# Patient Record
Sex: Male | Born: 1960
Health system: Southern US, Community
[De-identification: ages and names within clinical notes are randomized; demographics above are authoritative.]

## PROBLEM LIST (undated history)

## (undated) DIAGNOSIS — F988 Other specified behavioral and emotional disorders with onset usually occurring in childhood and adolescence: Secondary | ICD-10-CM

## (undated) DIAGNOSIS — F32A Depression, unspecified: Secondary | ICD-10-CM

## (undated) DIAGNOSIS — M549 Dorsalgia, unspecified: Secondary | ICD-10-CM

## (undated) DIAGNOSIS — E7800 Pure hypercholesterolemia, unspecified: Secondary | ICD-10-CM

## (undated) DIAGNOSIS — R739 Hyperglycemia, unspecified: Secondary | ICD-10-CM

## (undated) DIAGNOSIS — R7989 Other specified abnormal findings of blood chemistry: Secondary | ICD-10-CM

## (undated) DIAGNOSIS — I8390 Asymptomatic varicose veins of unspecified lower extremity: Secondary | ICD-10-CM

## (undated) DIAGNOSIS — R609 Edema, unspecified: Secondary | ICD-10-CM

## (undated) DIAGNOSIS — K649 Unspecified hemorrhoids: Secondary | ICD-10-CM

## (undated) DIAGNOSIS — I10 Essential (primary) hypertension: Secondary | ICD-10-CM

## (undated) DIAGNOSIS — M1990 Unspecified osteoarthritis, unspecified site: Secondary | ICD-10-CM

## (undated) DIAGNOSIS — F329 Major depressive disorder, single episode, unspecified: Secondary | ICD-10-CM

## (undated) DIAGNOSIS — E785 Hyperlipidemia, unspecified: Secondary | ICD-10-CM

## (undated) DIAGNOSIS — E291 Testicular hypofunction: Secondary | ICD-10-CM

## (undated) DIAGNOSIS — R600 Localized edema: Secondary | ICD-10-CM

## (undated) DIAGNOSIS — R945 Abnormal results of liver function studies: Secondary | ICD-10-CM

## (undated) DIAGNOSIS — M75122 Complete rotator cuff tear or rupture of left shoulder, not specified as traumatic: Secondary | ICD-10-CM

## (undated) DIAGNOSIS — G4730 Sleep apnea, unspecified: Secondary | ICD-10-CM

## (undated) DIAGNOSIS — M216X1 Other acquired deformities of right foot: Secondary | ICD-10-CM

## (undated) HISTORY — DX: Depression, unspecified: F32.A

## (undated) HISTORY — DX: Other specified behavioral and emotional disorders with onset usually occurring in childhood and adolescence: F98.8

## (undated) HISTORY — DX: Hyperglycemia, unspecified: R73.9

## (undated) HISTORY — DX: Major depressive disorder, single episode, unspecified: F32.9

## (undated) HISTORY — DX: Hyperlipidemia, unspecified: E78.5

## (undated) HISTORY — DX: Sleep apnea, unspecified: G47.30

## (undated) HISTORY — PX: OTHER SURGICAL HISTORY: SHX169

## (undated) HISTORY — DX: Other specified abnormal findings of blood chemistry: R79.89

## (undated) HISTORY — DX: Essential (primary) hypertension: I10

## (undated) HISTORY — DX: Abnormal results of liver function studies: R94.5

## (undated) HISTORY — DX: Other acquired deformities of right foot: M21.6X1

## (undated) HISTORY — DX: Testicular hypofunction: E29.1

---

## 1988-05-22 HISTORY — PX: VASECTOMY: SHX75

## 1999-05-23 HISTORY — PX: HERNIA REPAIR: SHX51

## 2002-08-20 ENCOUNTER — Ambulatory Visit (HOSPITAL_BASED_OUTPATIENT_CLINIC_OR_DEPARTMENT_OTHER): Admission: RE | Admit: 2002-08-20 | Discharge: 2002-08-20 | Payer: Self-pay | Admitting: Family Medicine

## 2003-05-23 DIAGNOSIS — G4730 Sleep apnea, unspecified: Secondary | ICD-10-CM

## 2003-05-23 HISTORY — DX: Sleep apnea, unspecified: G47.30

## 2004-08-26 ENCOUNTER — Ambulatory Visit: Payer: Self-pay | Admitting: Family Medicine

## 2004-09-08 ENCOUNTER — Ambulatory Visit: Payer: Self-pay | Admitting: Family Medicine

## 2005-01-16 ENCOUNTER — Emergency Department (HOSPITAL_COMMUNITY): Admission: EM | Admit: 2005-01-16 | Discharge: 2005-01-16 | Payer: Self-pay | Admitting: Emergency Medicine

## 2005-01-18 ENCOUNTER — Ambulatory Visit: Payer: Self-pay | Admitting: Internal Medicine

## 2005-02-23 ENCOUNTER — Ambulatory Visit (HOSPITAL_COMMUNITY): Admission: RE | Admit: 2005-02-23 | Discharge: 2005-02-23 | Payer: Self-pay | Admitting: Surgery

## 2005-04-12 ENCOUNTER — Ambulatory Visit: Payer: Self-pay | Admitting: Internal Medicine

## 2005-10-31 ENCOUNTER — Ambulatory Visit: Payer: Self-pay | Admitting: Internal Medicine

## 2006-03-07 ENCOUNTER — Encounter: Payer: Self-pay | Admitting: Internal Medicine

## 2006-05-22 HISTORY — PX: KNEE ARTHROSCOPY: SUR90

## 2006-10-29 ENCOUNTER — Encounter (INDEPENDENT_AMBULATORY_CARE_PROVIDER_SITE_OTHER): Payer: Self-pay | Admitting: *Deleted

## 2006-10-31 ENCOUNTER — Telehealth (INDEPENDENT_AMBULATORY_CARE_PROVIDER_SITE_OTHER): Payer: Self-pay | Admitting: *Deleted

## 2006-10-31 DIAGNOSIS — G4730 Sleep apnea, unspecified: Secondary | ICD-10-CM | POA: Insufficient documentation

## 2006-10-31 DIAGNOSIS — J309 Allergic rhinitis, unspecified: Secondary | ICD-10-CM | POA: Insufficient documentation

## 2006-10-31 DIAGNOSIS — F329 Major depressive disorder, single episode, unspecified: Secondary | ICD-10-CM

## 2006-10-31 DIAGNOSIS — F988 Other specified behavioral and emotional disorders with onset usually occurring in childhood and adolescence: Secondary | ICD-10-CM

## 2006-10-31 DIAGNOSIS — I10 Essential (primary) hypertension: Secondary | ICD-10-CM

## 2006-12-10 ENCOUNTER — Encounter (INDEPENDENT_AMBULATORY_CARE_PROVIDER_SITE_OTHER): Payer: Self-pay | Admitting: *Deleted

## 2007-01-02 ENCOUNTER — Ambulatory Visit: Payer: Self-pay | Admitting: Internal Medicine

## 2007-01-02 DIAGNOSIS — E291 Testicular hypofunction: Secondary | ICD-10-CM | POA: Insufficient documentation

## 2007-01-02 DIAGNOSIS — R945 Abnormal results of liver function studies: Secondary | ICD-10-CM

## 2007-01-16 ENCOUNTER — Ambulatory Visit: Payer: Self-pay | Admitting: Internal Medicine

## 2007-02-14 ENCOUNTER — Ambulatory Visit: Payer: Self-pay | Admitting: Internal Medicine

## 2007-02-26 ENCOUNTER — Ambulatory Visit: Payer: Self-pay | Admitting: Internal Medicine

## 2007-05-07 ENCOUNTER — Encounter (INDEPENDENT_AMBULATORY_CARE_PROVIDER_SITE_OTHER): Payer: Self-pay | Admitting: *Deleted

## 2007-08-13 ENCOUNTER — Telehealth (INDEPENDENT_AMBULATORY_CARE_PROVIDER_SITE_OTHER): Payer: Self-pay | Admitting: *Deleted

## 2007-08-13 ENCOUNTER — Encounter (INDEPENDENT_AMBULATORY_CARE_PROVIDER_SITE_OTHER): Payer: Self-pay | Admitting: *Deleted

## 2007-08-19 ENCOUNTER — Telehealth: Payer: Self-pay | Admitting: Internal Medicine

## 2007-08-28 ENCOUNTER — Ambulatory Visit: Payer: Self-pay | Admitting: Internal Medicine

## 2007-08-28 DIAGNOSIS — E785 Hyperlipidemia, unspecified: Secondary | ICD-10-CM

## 2007-08-28 DIAGNOSIS — R7309 Other abnormal glucose: Secondary | ICD-10-CM

## 2007-09-03 ENCOUNTER — Ambulatory Visit: Payer: Self-pay | Admitting: Internal Medicine

## 2007-09-05 LAB — CONVERTED CEMR LAB
Cholesterol: 202 mg/dL (ref 0–200)
Direct LDL: 138.8 mg/dL
HDL: 47.9 mg/dL (ref >39.0)
Hgb A1c MFr Bld: 5.9 % (ref 4.6–6.0)
Total CHOL/HDL Ratio: 4.2
Triglycerides: 110 mg/dL (ref 0–149)
VLDL: 22 mg/dL (ref 0–40)

## 2007-12-02 ENCOUNTER — Ambulatory Visit: Payer: Self-pay | Admitting: Internal Medicine

## 2007-12-18 ENCOUNTER — Telehealth (INDEPENDENT_AMBULATORY_CARE_PROVIDER_SITE_OTHER): Payer: Self-pay | Admitting: *Deleted

## 2007-12-25 ENCOUNTER — Ambulatory Visit: Payer: Self-pay | Admitting: Internal Medicine

## 2008-01-20 ENCOUNTER — Ambulatory Visit: Payer: Self-pay | Admitting: Internal Medicine

## 2008-02-04 ENCOUNTER — Ambulatory Visit: Payer: Self-pay | Admitting: Internal Medicine

## 2008-02-18 ENCOUNTER — Ambulatory Visit: Payer: Self-pay | Admitting: Internal Medicine

## 2008-03-03 ENCOUNTER — Encounter (INDEPENDENT_AMBULATORY_CARE_PROVIDER_SITE_OTHER): Payer: Self-pay | Admitting: *Deleted

## 2008-03-19 ENCOUNTER — Ambulatory Visit: Payer: Self-pay | Admitting: Internal Medicine

## 2008-04-02 ENCOUNTER — Ambulatory Visit: Payer: Self-pay | Admitting: Internal Medicine

## 2008-04-09 ENCOUNTER — Telehealth (INDEPENDENT_AMBULATORY_CARE_PROVIDER_SITE_OTHER): Payer: Self-pay | Admitting: *Deleted

## 2008-04-13 ENCOUNTER — Telehealth (INDEPENDENT_AMBULATORY_CARE_PROVIDER_SITE_OTHER): Payer: Self-pay | Admitting: *Deleted

## 2008-04-30 ENCOUNTER — Ambulatory Visit (HOSPITAL_COMMUNITY): Admission: RE | Admit: 2008-04-30 | Discharge: 2008-04-30 | Payer: Self-pay | Admitting: Orthopedic Surgery

## 2008-05-07 ENCOUNTER — Ambulatory Visit: Payer: Self-pay | Admitting: Internal Medicine

## 2008-05-13 ENCOUNTER — Encounter (INDEPENDENT_AMBULATORY_CARE_PROVIDER_SITE_OTHER): Payer: Self-pay | Admitting: *Deleted

## 2008-05-13 LAB — CONVERTED CEMR LAB
BUN: 26 mg/dL — ABNORMAL HIGH (ref 6–23)
CO2: 29 meq/L (ref 19–32)
Calcium: 9.5 mg/dL (ref 8.4–10.5)
Chloride: 102 meq/L (ref 96–112)
Creatinine, Ser: 1.1 mg/dL (ref 0.4–1.5)
GFR calc Af Amer: 92 mL/min
GFR calc non Af Amer: 76 mL/min
Glucose, Bld: 84 mg/dL (ref 70–99)
Hgb A1c MFr Bld: 6 % (ref 4.6–6.0)
Potassium: 3.9 meq/L (ref 3.5–5.1)
Sodium: 139 meq/L (ref 135–145)

## 2008-05-19 ENCOUNTER — Telehealth (INDEPENDENT_AMBULATORY_CARE_PROVIDER_SITE_OTHER): Payer: Self-pay | Admitting: *Deleted

## 2008-05-21 ENCOUNTER — Ambulatory Visit: Payer: Self-pay | Admitting: Internal Medicine

## 2008-05-22 HISTORY — PX: KNEE ARTHROSCOPY: SUR90

## 2008-05-25 ENCOUNTER — Ambulatory Visit (HOSPITAL_COMMUNITY): Admission: RE | Admit: 2008-05-25 | Discharge: 2008-05-25 | Payer: Self-pay | Admitting: Orthopedic Surgery

## 2008-06-15 ENCOUNTER — Encounter: Admission: RE | Admit: 2008-06-15 | Discharge: 2008-07-22 | Payer: Self-pay | Admitting: Orthopedic Surgery

## 2008-07-06 ENCOUNTER — Ambulatory Visit: Payer: Self-pay | Admitting: Internal Medicine

## 2008-07-14 ENCOUNTER — Encounter (INDEPENDENT_AMBULATORY_CARE_PROVIDER_SITE_OTHER): Payer: Self-pay | Admitting: *Deleted

## 2008-07-14 LAB — CONVERTED CEMR LAB
ALT: 25 units/L (ref 0–53)
AST: 27 units/L (ref 0–37)
Albumin: 4.1 g/dL (ref 3.5–5.2)
Alkaline Phosphatase: 90 units/L (ref 39–117)
Basophils Absolute: 0 10*3/uL (ref 0.0–0.1)
Basophils Relative: 0.3 % (ref 0.0–3.0)
Bilirubin, Direct: 0.1 mg/dL (ref 0.0–0.3)
Cholesterol: 173 mg/dL (ref 0–200)
Eosinophils Absolute: 0.3 10*3/uL (ref 0.0–0.7)
Eosinophils Relative: 4.1 % (ref 0.0–5.0)
HCT: 44.9 % (ref 39.0–52.0)
HDL: 43 mg/dL (ref >39.0)
Hemoglobin: 15.1 g/dL (ref 13.0–17.0)
LDL Cholesterol: 111 mg/dL — ABNORMAL HIGH (ref 0–99)
Lymphocytes Relative: 25.9 % (ref 12.0–46.0)
MCHC: 33.7 g/dL (ref 30.0–36.0)
MCV: 82.9 fL (ref 78.0–100.0)
Monocytes Absolute: 0.7 10*3/uL (ref 0.1–1.0)
Monocytes Relative: 10.9 % (ref 3.0–12.0)
Neutro Abs: 4 10*3/uL (ref 1.4–7.7)
Neutrophils Relative %: 58.8 % (ref 43.0–77.0)
Platelets: 219 10*3/uL (ref 150–400)
RBC: 5.42 M/uL (ref 4.22–5.81)
RDW: 16.3 % — ABNORMAL HIGH (ref 11.5–14.6)
TSH: 1.93 microintl units/mL (ref 0.35–5.50)
Total Bilirubin: 0.8 mg/dL (ref 0.3–1.2)
Total CHOL/HDL Ratio: 4
Total Protein: 7.6 g/dL (ref 6.0–8.3)
Triglycerides: 97 mg/dL (ref 0–149)
VLDL: 19 mg/dL (ref 0–40)
WBC: 6.7 10*3/uL (ref 4.5–10.5)

## 2008-07-22 ENCOUNTER — Ambulatory Visit: Payer: Self-pay | Admitting: Internal Medicine

## 2008-07-27 ENCOUNTER — Telehealth (INDEPENDENT_AMBULATORY_CARE_PROVIDER_SITE_OTHER): Payer: Self-pay | Admitting: *Deleted

## 2008-07-27 LAB — CONVERTED CEMR LAB
PSA: 0.58 ng/mL (ref 0.10–4.00)
Testosterone: 274.23 ng/dL — ABNORMAL LOW (ref 350.00–890.00)

## 2008-08-05 ENCOUNTER — Telehealth (INDEPENDENT_AMBULATORY_CARE_PROVIDER_SITE_OTHER): Payer: Self-pay | Admitting: *Deleted

## 2008-11-06 ENCOUNTER — Ambulatory Visit: Payer: Self-pay | Admitting: Internal Medicine

## 2008-11-10 ENCOUNTER — Telehealth (INDEPENDENT_AMBULATORY_CARE_PROVIDER_SITE_OTHER): Payer: Self-pay | Admitting: *Deleted

## 2008-11-10 LAB — CONVERTED CEMR LAB
Hgb A1c MFr Bld: 6.1 % (ref 4.6–6.5)
Testosterone: 540.52 ng/dL (ref 350.00–890.00)

## 2009-03-09 ENCOUNTER — Ambulatory Visit: Payer: Self-pay | Admitting: Internal Medicine

## 2009-03-15 LAB — CONVERTED CEMR LAB
Hgb A1c MFr Bld: 5.6 % (ref 4.6–6.5)
Testosterone: 1169.31 ng/dL — ABNORMAL HIGH (ref 350.00–890.00)

## 2009-03-23 ENCOUNTER — Encounter (INDEPENDENT_AMBULATORY_CARE_PROVIDER_SITE_OTHER): Payer: Self-pay | Admitting: *Deleted

## 2009-05-04 ENCOUNTER — Telehealth (INDEPENDENT_AMBULATORY_CARE_PROVIDER_SITE_OTHER): Payer: Self-pay | Admitting: *Deleted

## 2009-07-08 ENCOUNTER — Telehealth (INDEPENDENT_AMBULATORY_CARE_PROVIDER_SITE_OTHER): Payer: Self-pay | Admitting: *Deleted

## 2009-07-13 ENCOUNTER — Ambulatory Visit: Payer: Self-pay | Admitting: Internal Medicine

## 2009-07-13 LAB — CONVERTED CEMR LAB
ALT: 24 units/L (ref 0–53)
AST: 27 units/L (ref 0–37)
Albumin: 4.2 g/dL (ref 3.5–5.2)
Alkaline Phosphatase: 72 units/L (ref 39–117)
BUN: 15 mg/dL (ref 6–23)
Basophils Absolute: 0 10*3/uL (ref 0.0–0.1)
Basophils Relative: 0.3 % (ref 0.0–3.0)
Bilirubin, Direct: 0 mg/dL (ref 0.0–0.3)
CO2: 32 meq/L (ref 19–32)
Calcium: 9.5 mg/dL (ref 8.4–10.5)
Chloride: 100 meq/L (ref 96–112)
Cholesterol: 174 mg/dL (ref 0–200)
Creatinine, Ser: 1.3 mg/dL (ref 0.4–1.5)
Eosinophils Absolute: 0.2 10*3/uL (ref 0.0–0.7)
Eosinophils Relative: 3.7 % (ref 0.0–5.0)
GFR calc non Af Amer: 61.99 mL/min (ref >60)
Glucose, Bld: 82 mg/dL (ref 70–99)
HCT: 51.4 % (ref 39.0–52.0)
HDL: 50.3 mg/dL (ref >39.00)
Hemoglobin: 16.8 g/dL (ref 13.0–17.0)
LDL Cholesterol: 95 mg/dL (ref 0–99)
Lymphocytes Relative: 26.6 % (ref 12.0–46.0)
Lymphs Abs: 1.5 10*3/uL (ref 0.7–4.0)
MCHC: 32.7 g/dL (ref 30.0–36.0)
MCV: 88.9 fL (ref 78.0–100.0)
Monocytes Absolute: 0.7 10*3/uL (ref 0.1–1.0)
Monocytes Relative: 11.7 % (ref 3.0–12.0)
Neutro Abs: 3.2 10*3/uL (ref 1.4–7.7)
Neutrophils Relative %: 57.7 % (ref 43.0–77.0)
PSA: 1.03 ng/mL (ref 0.10–4.00)
Platelets: 224 10*3/uL (ref 150.0–400.0)
Potassium: 4.3 meq/L (ref 3.5–5.1)
RBC: 5.78 M/uL (ref 4.22–5.81)
RDW: 15.8 % — ABNORMAL HIGH (ref 11.5–14.6)
Sodium: 140 meq/L (ref 135–145)
Testosterone: 540.22 ng/dL (ref 350.00–890.00)
Total Bilirubin: 0.3 mg/dL (ref 0.3–1.2)
Total CHOL/HDL Ratio: 3
Total Protein: 7.1 g/dL (ref 6.0–8.3)
Triglycerides: 146 mg/dL (ref 0.0–149.0)
VLDL: 29.2 mg/dL (ref 0.0–40.0)
WBC: 5.6 10*3/uL (ref 4.5–10.5)

## 2009-07-16 ENCOUNTER — Ambulatory Visit: Payer: Self-pay | Admitting: Internal Medicine

## 2009-07-19 ENCOUNTER — Encounter (INDEPENDENT_AMBULATORY_CARE_PROVIDER_SITE_OTHER): Payer: Self-pay | Admitting: *Deleted

## 2009-08-05 ENCOUNTER — Ambulatory Visit (HOSPITAL_COMMUNITY): Admission: RE | Admit: 2009-08-05 | Discharge: 2009-08-05 | Payer: Self-pay | Admitting: Orthopedic Surgery

## 2009-08-09 ENCOUNTER — Encounter (INDEPENDENT_AMBULATORY_CARE_PROVIDER_SITE_OTHER): Payer: Self-pay | Admitting: *Deleted

## 2009-08-10 ENCOUNTER — Ambulatory Visit: Payer: Self-pay | Admitting: Gastroenterology

## 2009-08-24 ENCOUNTER — Ambulatory Visit: Payer: Self-pay | Admitting: Gastroenterology

## 2009-08-24 LAB — HM COLONOSCOPY

## 2009-10-05 ENCOUNTER — Ambulatory Visit (HOSPITAL_BASED_OUTPATIENT_CLINIC_OR_DEPARTMENT_OTHER): Admission: RE | Admit: 2009-10-05 | Discharge: 2009-10-05 | Payer: Self-pay | Admitting: Orthopedic Surgery

## 2009-11-12 ENCOUNTER — Ambulatory Visit: Payer: Self-pay | Admitting: Internal Medicine

## 2009-11-17 ENCOUNTER — Telehealth (INDEPENDENT_AMBULATORY_CARE_PROVIDER_SITE_OTHER): Payer: Self-pay | Admitting: *Deleted

## 2009-11-19 LAB — CONVERTED CEMR LAB: Hgb A1c MFr Bld: 5.8 % (ref 4.6–6.5)

## 2009-11-23 ENCOUNTER — Telehealth: Payer: Self-pay | Admitting: Internal Medicine

## 2010-02-09 ENCOUNTER — Encounter: Payer: Self-pay | Admitting: Internal Medicine

## 2010-02-28 ENCOUNTER — Telehealth (INDEPENDENT_AMBULATORY_CARE_PROVIDER_SITE_OTHER): Payer: Self-pay | Admitting: *Deleted

## 2010-03-24 ENCOUNTER — Telehealth: Payer: Self-pay | Admitting: Internal Medicine

## 2010-03-28 ENCOUNTER — Encounter: Payer: Self-pay | Admitting: Internal Medicine

## 2010-04-05 ENCOUNTER — Encounter: Payer: Self-pay | Admitting: Internal Medicine

## 2010-05-02 ENCOUNTER — Encounter: Payer: Self-pay | Admitting: Internal Medicine

## 2010-06-19 LAB — CONVERTED CEMR LAB
ALT: 42 units/L (ref 0–53)
AST: 36 units/L (ref 0–37)
BUN: 19 mg/dL (ref 6–23)
Basophils Absolute: 0 10*3/uL (ref 0.0–0.1)
Basophils Relative: 0.3 % (ref 0.0–1.0)
CO2: 28 meq/L (ref 19–32)
Calcium: 9.6 mg/dL (ref 8.4–10.5)
Chloride: 102 meq/L (ref 96–112)
Cholesterol: 205 mg/dL (ref 0–200)
Creatinine, Ser: 1.1 mg/dL (ref 0.4–1.5)
Direct LDL: 143.3 mg/dL
Eosinophils Absolute: 0.2 10*3/uL (ref 0.0–0.6)
Eosinophils Relative: 4.2 % (ref 0.0–5.0)
GFR calc Af Amer: 92 mL/min
GFR calc non Af Amer: 76 mL/min
Glucose, Bld: 95 mg/dL (ref 70–99)
HCT: 43 % (ref 39.0–52.0)
HDL: 49.6 mg/dL (ref >39.0)
Hemoglobin: 15 g/dL (ref 13.0–17.0)
Hgb A1c MFr Bld: 6 % (ref 4.6–6.0)
Lymphocytes Relative: 30.8 % (ref 12.0–46.0)
MCHC: 34.8 g/dL (ref 30.0–36.0)
MCV: 80.8 fL (ref 78.0–100.0)
Monocytes Absolute: 0.6 10*3/uL (ref 0.2–0.7)
Monocytes Relative: 11.4 % — ABNORMAL HIGH (ref 3.0–11.0)
Neutro Abs: 3 10*3/uL (ref 1.4–7.7)
Neutrophils Relative %: 53.3 % (ref 43.0–77.0)
Platelets: 236 10*3/uL (ref 150–400)
Potassium: 4.3 meq/L (ref 3.5–5.1)
RBC: 5.32 M/uL (ref 4.22–5.81)
RDW: 14.7 % — ABNORMAL HIGH (ref 11.5–14.6)
Sodium: 139 meq/L (ref 135–145)
TSH: 2.18 microintl units/mL (ref 0.35–5.50)
Testosterone: 1216.83 ng/dL — ABNORMAL HIGH (ref 350.00–890)
Total CHOL/HDL Ratio: 4.1
Triglycerides: 151 mg/dL — ABNORMAL HIGH (ref 0–149)
VLDL: 30 mg/dL (ref 0–40)
WBC: 5.5 10*3/uL (ref 4.5–10.5)

## 2010-06-21 NOTE — Progress Notes (Signed)
Summary: refill  Phone Note Refill Request Message from:  Fax from Pharmacy  Refills Requested: Medication #1:  METOPROLOL TARTRATE 50 MG  TABS 1 by mouth bid [facility] - fax [phone]  Initial call taken by: Okey Regal [place],  February 28, 2010 1:41 PM    Prescriptions: METOPROLOL TARTRATE 50 MG  TABS (METOPROLOL TARTRATE) 1 by mouth bid  #180 x 0   Entered by:   Army Fossa CMA   Authorized by:   Nolon Rod. Paz MD   Signed by:   Army Fossa CMA on 02/28/2010   Method used:   Electronically to        [facility]* (retail)       [address]       [address] [place] [employer]       [place], [place]  [address]       Ph: [phone]       Fax: [phone]   RxID:   [id number]

## 2010-06-21 NOTE — Procedures (Signed)
Summary: Colonoscopy  Patient: [name] Note: All result statuses are Final unless otherwise noted.  Tests: (1) Colonoscopy (COL)   COL Colonoscopy           DONE     [facility]     [address]     [place], [place]  [address]           COLONOSCOPY PROCEDURE REPORT           PATIENT:  James Boone, James Boone  MR#:  [id number]     BIRTHDATE:  Nov 23, 1960, 48 yrs. old  GENDER:  male     ENDOSCOPIST:  Rachael Fee, MD     REF. BY:  [name], M.D.     PROCEDURE DATE:  08/24/2009     PROCEDURE:  Diagnostic Colonoscopy     ASA CLASS:  Class II     INDICATIONS:  FOBT positive stool     MEDICATIONS:   Fentanyl 75 mcg IV, Versed 9 mg IV     DESCRIPTION OF PROCEDURE:   After the risks benefits and     alternatives of the procedure were thoroughly explained, informed     consent was obtained.  Digital rectal exam was performed and     revealed no rectal masses.   The LB [id number] endoscope     was introduced through the anus and advanced to the cecum, which     was identified by both the appendix and ileocecal valve, without     limitations.  The quality of the prep was good, using MoviPrep.     The instrument was then slowly withdrawn as the colon was fully     examined.     <<PROCEDUREIMAGES>>           FINDINGS:  Moderate diverticulosis was found in the sigmoid to     descending colon segments (see image3).  Internal and external     hemorrhoids were found. These were small, not thrombosed.  This     was otherwise a normal examination of the colon (see image1,     image2, and image4).   Retroflexed views in the rectum revealed no     abnormalities.    The scope was then withdrawn from the patient     and the procedure completed.           COMPLICATIONS:  None     ENDOSCOPIC IMPRESSION:     1) Moderate diverticulosis in the sigmoid to descending colon     segments     2) Internal and external hemorrhoids     3) Otherwise normal examination; no polyps or cancers  RECOMMENDATIONS:     1) Continue current colorectal screening recommendations for     "routine risk" patients with a repeat colonoscopy in 10 years.           REPEAT EXAM:  10 years           ______________________________     Rachael Fee, MD           n.     eSIGNED:   Rachael Fee at 08/24/2009 09:27 AM           Vassie Loll, [id number]  Note: An exclamation mark (!) indicates a result that was not dispersed into the flowsheet. Document Creation Date: 08/24/2009 9:27 AM _______________________________________________________________________  (1) Order result status: Final Collection or observation date-time: 08/24/2009 09:23 Requested date-time:  Receipt date-time:  Reported date-time:  Referring Physician:  Ordering Physician: Rob Bunting [phone]) Specimen Source:  Source: Launa Grill Order Number: [id number] Lab site:   Appended Document: Colonoscopy    Clinical Lists Changes  Observations: Added new observation of COLONNXTDUE: 08/2019 (08/24/2009 13:02)      Appended Document: Colonoscopy patty, can you contact office of Dr. Dimas Millin [phone]) to have them send copies of any colonoscopy, path reports, office visit's on this patient  Appended Document: Colonoscopy Dr Christella Hartigan I called the above number and they do not have this pt.  She checked the birthdate and name.  Appended Document: Colonoscopy this was for the wrong patient.

## 2010-06-21 NOTE — Progress Notes (Signed)
----   Converted from flag ---- ---- 11/14/2009 12:35 PM, Jose E. Paz MD wrote: James Boone,  please contact Onalee Hua (do you know who i'm talking about?) or [facility] or other Respiratory Therapist group, he has OSA and needs a new mask ------------------------------       New/Updated Medications: * MASK FOR CPAP needs new mask DX: OSA- 780.57 Prescriptions: MASK FOR CPAP needs new mask DX: OSA- 780.57  #0 x 0   Entered by:   Doristine Devoid   Authorized by:   Nolon Rod. Paz MD   Signed by:   Doristine Devoid on 11/17/2009   Method used:   Reprint   RxID:   [id number]  information faxed to [facility] fax: [phone]

## 2010-06-21 NOTE — Letter (Signed)
Summary: [facility] Instructions  [facility]  [address] [place], [place] [address]   Phone: [phone]  Fax: [phone]       James Boone    09-10-1960    MRN: [id number]        Procedure Day Dorna Bloom:  James Boone  08/24/09     Arrival Time:  8:00AM     Procedure Time:  9:00AM     Location of Procedure:                    James Boone _  James Boone (4th Floor)                        PREPARATION FOR COLONOSCOPY WITH MOVIPREP   Starting 5 days prior to your procedure 08/19/09 do not eat nuts, seeds, popcorn, corn, beans, peas,  salads, or any raw vegetables.  Do not take any fiber supplements (e.g. Metamucil, Citrucel, and Benefiber).  THE DAY BEFORE YOUR PROCEDURE         DATE: 08/23/09  DAY: MONDAY  1.  Drink clear liquids the entire day-NO SOLID FOOD  2.  Do not drink anything colored red or purple.  Avoid juices with pulp.  No orange juice.  3.  Drink at least 64 oz. (8 glasses) of fluid/clear liquids during the day to prevent dehydration and help the prep work efficiently.  CLEAR LIQUIDS INCLUDE: Water Jello Ice Popsicles Tea (sugar ok, no milk/cream) Powdered fruit flavored drinks Coffee (sugar ok, no milk/cream) Gatorade Juice: apple, white grape, white cranberry  Lemonade Clear bullion, consomm, broth Carbonated beverages (any kind) Strained chicken noodle soup Hard Candy                             4.  In the morning, mix first dose of MoviPrep solution:    Empty 1 Pouch A and 1 Pouch B into the disposable container    Add lukewarm drinking water to the top line of the container. Mix to dissolve    Refrigerate (mixed solution should be used within 24 hrs)  5.  Begin drinking the prep at 5:00 p.m. The MoviPrep container is divided by 4 marks.   Every 15 minutes drink the solution down to the next mark (approximately 8 oz) until the full liter is complete.   6.  Follow completed prep with 16 oz of clear liquid of your choice (Nothing red  or purple).  Continue to drink clear liquids until bedtime.  7.  Before going to bed, mix second dose of MoviPrep solution:    Empty 1 Pouch A and 1 Pouch B into the disposable container    Add lukewarm drinking water to the top line of the container. Mix to dissolve    Refrigerate  THE DAY OF YOUR PROCEDURE      DATE: 07/24/09  DAY: TUESDAY  Beginning at 4:00AM (5 hours before procedure):         1. Every 15 minutes, drink the solution down to the next mark (approx 8 oz) until the full liter is complete.  2. Follow completed prep with 16 oz. of clear liquid of your choice.    3. You may drink clear liquids until 7:00AM (2 HOURS BEFORE PROCEDURE).   MEDICATION INSTRUCTIONS  Unless otherwise instructed, you should take regular prescription medications with a small sip of water   as early as possible the morning  of your procedure.         OTHER INSTRUCTIONS  You will need a responsible adult at least 52 years of age to accompany you and drive you home.   This person must remain in the waiting room during your procedure.  Wear loose fitting clothing that is easily removed.  Leave jewelry and other valuables at home.  However, you may wish to bring a book to read or  an iPod/MP3 player to listen to music as you wait for your procedure to start.  Remove all body piercing jewelry and leave at home.  Total time from sign-in until discharge is approximately 2-3 hours.  You should go home directly after your procedure and rest.  You can resume normal activities the  day after your procedure.  The day of your procedure you should not:   Drive   Make legal decisions   Operate machinery   Drink alcohol   Return to work  You will receive specific instructions about eating, activities and medications before you leave.    The above instructions have been reviewed and explained to me by   James Almas RN  August 10, 2009 8:14 AM     I fully understand and can  verbalize these instructions _____________________________ Date _________

## 2010-06-21 NOTE — Miscellaneous (Signed)
Summary: [facility] Previsit/prep  Clinical Lists Changes  Medications: Added new medication of MOVIPREP 100 GM  SOLR (PEG-KCL-NACL-NASULF-NA ASC-C) As per prep instructions. - Signed Rx of MOVIPREP 100 GM  SOLR (PEG-KCL-NACL-NASULF-NA ASC-C) As per prep instructions.;  #1 x 0;  Signed;  Entered by: Wyona Almas RN;  Authorized by: Rachael Fee MD;  Method used: Electronically to [facility]*, [address], [place], [place]  [address], Ph: [phone], Fax: [phone] Observations: Added new observation of NKA: T (08/10/2009 7:42)    Prescriptions: MOVIPREP 100 GM  SOLR (PEG-KCL-NACL-NASULF-NA ASC-C) As per prep instructions.  #1 x 0   Entered by:   Wyona Almas RN   Authorized by:   Rachael Fee MD   Signed by:   Wyona Almas RN on 08/10/2009   Method used:   Electronically to        [facility]* (retail)       [address]       [address]mailing       [place], [place]  [address]       Ph: [phone]       Fax: [phone]   RxID:   [id number]

## 2010-06-21 NOTE — Letter (Signed)
Summary: Previsit letter  [facility]  [address] [place], [place] [address]   Phone: [phone]  Fax: [phone]       07/19/2009 MRN: [id number]  James Boone [address] [place], [place]  [address]  Dear [name],  Welcome to the Gastroenterology Division at [facility].    You are scheduled to see a nurse for your pre-procedure visit on 08-10-09 at 8:00a.m. on the 3rd floor at [facility], [address]  We ask that you try to arrive at our office 15 minutes prior to your appointment time to allow for check-in.  Your nurse visit will consist of discussing your medical and surgical history, your immediate family medical history, and your medications.    Please bring a complete list of all your medications or, if you prefer, bring the medication bottles and we will list them.  We will need to be aware of both prescribed and over the counter drugs.  We will need to know exact dosage information as well.  If you are on blood thinners (Coumadin, Plavix, Aggrenox, Ticlid, etc.) please call our office today/prior to your appointment, as we need to consult with your physician about holding your medication.   Please be prepared to read and sign documents such as consent forms, a financial agreement, and acknowledgement forms.  If necessary, and with your consent, a friend or relative is welcome to sit-in on the nurse visit with you.  Please bring your insurance card so that we may make a copy of it.  If your insurance requires a referral to see a specialist, please bring your referral form from your primary care physician.  No co-pay is required for this nurse visit.     If you cannot keep your appointment, please call [phone] to cancel or reschedule prior to your appointment date.  This allows [place] the opportunity to schedule an appointment for another patient in need of care.    Thank you for choosing [facility] for your medical needs.  We  appreciate the opportunity to care for you.  Please visit [place] at our website  to learn more about our practice.                     Sincerely.                                                                                                                   The Gastroenterology Division

## 2010-06-21 NOTE — Letter (Signed)
Summary: CMN for Mask/[facility]  CMN for Mask/[facility]   Imported By: Lanelle Bal 02/17/2010 09:10:52  _____________________________________________________________________  External Attachment:    Type:   Image     Comment:   External Document

## 2010-06-21 NOTE — Assessment & Plan Note (Signed)
Summary: HAD LABS 2/22, TODAY IS CPX--GOT BUMPED///SPH   Vital Signs:  Patient profile:   50 year old male Height:      76 inches Weight:      295 pounds BMI:     36.04 Pulse rate:   76 / minute BP sitting:   132 / 80  (left arm)  Vitals Entered By: Doristine Devoid (July 16, 2009 11:32 AM) CC: cpx    History of Present Illness: CPX  Allergies: No Known Drug Allergies  Past History:  Past Medical History: Reviewed history from 03/09/2009 and no changes required. HYPERLIPIDEMIA DEPRESSION   HYPERTENSION  HYPOGONADISM, MALE  ADD  LIVER FUNCTION TESTS, ABNORMAL  SLEEP APNEA Dx aprox 2005, has a CPAP but doesn't use it often ALLERGIC RHINITIS (ICD-477.9)  Past Surgical History: Reviewed history from 07/06/2008 and no changes required. Vasectomy Umbilical Hernia repair L knee arthroscopt 05-2008 Dr Juliene Pina  Family History: Reviewed history from 03/09/2009 and no changes required. Family History Depression stroke--F CAD  father had a MI and a CABG at 47y/o colon ca: no prostate ca: no DM: no  Social History: Never Smoked Alcohol use-yes-seldom Married 2 kids unemployed  since 2008 diet-- not good  exercise-- none (has knee pain)  Review of Systems General:  Denies fatigue and fever. CV:  Denies chest pain or discomfort and swelling of feet; ambulatory BPs 120s . Resp:  Denies cough, shortness of breath, and wheezing; does not use CPAP consistently . GI:  Denies bloody stools, nausea, and vomiting. GU:  Denies hematuria, urinary frequency, and urinary hesitancy. Psych:  mood ok, sees psych .  Physical Exam  General:  alert, well-developed, and overweight-appearing.   Neck:  no masses and no thyromegaly.   Lungs:  normal respiratory effort, no intercostal retractions, no accessory muscle use, and normal breath sounds.   Heart:  normal rate, regular rhythm, and no murmur.   Abdomen:  soft, non-tender, no distention, no masses, no guarding, and no  rigidity.   Rectal:  No external abnormalities noted. Normal sphincter tone. No rectal masses or tenderness. stools brown, Hemoccult positive Prostate:  Prostate gland firm and smooth, no enlargement, nodularity, tenderness, mass, asymmetry or induration. Extremities:  no edema, walks with difficulty, has a brace in the right knee Psych:  Oriented X3, memory intact for recent and remote, normally interactive, good eye contact, not anxious appearing, and not depressed appearing.     Impression & Recommendations:  Problem # 1:  HEALTH SCREENING (ICD-V70.0) Td 2003 he has a positive family history of heart dz: again rec.  ASA, advised to use CPAP consistently to decrease CV risk  Hemoccult-positive, recommend colonoscopy.  Rationale behind this recommendation discussed all labs discussed, they are within normal unable to exercise information provided regards  a healthy diet  Orders: Gastroenterology Referral (GI)  Problem # 2:  HYPOGONADISM, MALE (ICD-257.2) last testosterone at  goal, no change  Complete Medication List: 1)  Metoprolol Tartrate 50 Mg Tabs (Metoprolol tartrate) .Marland Kitchen.. 1 by mouth bid 2)  Adderall Xr 25 Mg Cp24 (Amphetamine-dextroamphetamine) .... 2 by mouth qd 3)  Zoloft Tabs (Sertraline hcl tabs) .... 100mg  2 by mouth qd 4)  Lamictal Tabs (Lamotrigine tabs) .... 300mg  qd 5)  Vitamin C  6)  B Complex-b12 Tabs (B complex vitamins) 7)  Depo-testosterone 200 Mg/ml Oil (Testosterone cypionate) .... 1.5 cc every 2 weeks 8)  Asa 81mg   9)  Syringes For Im Testosterone Injections  .... As directed 10)  Wellbutrin Xl 150 Mg  Xr24h-tab (Bupropion hcl) .... Once daily  Patient Instructions: 1)  Please schedule a follow-up appointment in 4 months .

## 2010-06-21 NOTE — Progress Notes (Signed)
Summary: CPAP REQUEST  Phone Note From Other Clinic   Caller: [place] [phone] Summary of Call: Lillia Abed called to notify that she did receive patient sleep study, etc. She does need new prescription for replacement cpap and settings. Please fax to [phone].  *DME faxed per request. Initial call taken by: Lucious Groves CMA,  March 24, 2010 11:58 AM

## 2010-06-21 NOTE — Assessment & Plan Note (Signed)
Summary: 4 MONTH FOLLOWUP///SPH--Rm 12   Vital Signs:  Patient profile:   50 year old male Height:      76 inches Weight:      293 pounds BMI:     35.79 Temp:     97.9 degrees F oral Pulse rate:   72 / minute Pulse rhythm:   regular Resp:     16 per minute BP sitting:   126 / 90  (left arm) Cuff size:   large  Vitals Entered By: Mervin Kung CMA (November 12, 2009 10:10 AM) CC: Room 12  4 month follow up, non-fasting.  Pt states he needs new CPAP mask, his is broken. Comments Pt agrees all med doses and directions are correct.   History of Present Illness: ROV  DEPRESSION  -- per psych , symptoms well controlled  HYPERTENSION -- good  ambulatory BPs  HYPOGONADISM-- good medication compliance   SLEEP APNEA -- unable to use due to mask not working , needs a new mask     Allergies (verified): No Known Drug Allergies  Past History:  Past Medical History: Reviewed history from 03/09/2009 and no changes required. HYPERLIPIDEMIA DEPRESSION   HYPERTENSION  HYPOGONADISM, MALE  ADD  LIVER FUNCTION TESTS, ABNORMAL  SLEEP APNEA Dx aprox 2005, has a CPAP but doesn't use it often ALLERGIC RHINITIS (ICD-477.9)  Past Surgical History: Vasectomy Umbilical Hernia repair L knee arthroscopt 05-2008 Dr Juliene Pina R knee  arthroscopy surgery 5-11 Dr Juliene Pina  Social History: Reviewed history from 07/16/2009 and no changes required. Never Smoked Alcohol use-yes-seldom Married 2 kids unemployed  since 2008 diet-- not good  exercise-- none (has knee pain)  Review of Systems CV:  Denies chest pain or discomfort, shortness of breath with exertion, and swelling of feet. Endo:  Denies weight change; no hot flashes .  Physical Exam  General:  alert and well-developed.   Lungs:  normal respiratory effort, no intercostal retractions, no accessory muscle use, and normal breath sounds.   Heart:  normal rate, regular rhythm, and no murmur.   Extremities:  +/+++ pitting edema  bilaterally,at baseline per patient Psych:  not anxious appearing and not depressed appearing.     Impression & Recommendations:  Problem # 1:  HYPERGLYCEMIA, BORDERLINE (ICD-790.29) due for labs Orders: Venipuncture (92119) TLB-A1C / Hgb A1C (Glycohemoglobin) (83036-A1C)  Problem # 2:  HYPERTENSION (ICD-401.9) the BP slightly high today, good ambulatory BPs, no change His updated medication list for this problem includes:    Metoprolol Tartrate 50 Mg Tabs (Metoprolol tartrate) .Marland Kitchen... 1 by mouth bid  BP today: 126/90 Prior BP: 132/80 (07/16/2009)  Labs Reviewed: K+: 4.3 (07/13/2009) Creat: : 1.3 (07/13/2009)   Chol: 174 (07/13/2009)   HDL: 50.30 (07/13/2009)   LDL: 95 (07/13/2009)   TG: 146.0 (07/13/2009)  Problem # 3:  HYPOGONADISM, MALE (ICD-257.2) last testosterone level therapeutic No change  Problem # 4:  SLEEP APNEA (ICD-780.57) reports that his mask "falls apart a lot" Will refer to  RT for  mask fitting   Complete Medication List: 1)  Metoprolol Tartrate 50 Mg Tabs (Metoprolol tartrate) .Marland Kitchen.. 1 by mouth bid 2)  Adderall Xr 25 Mg Cp24 (Amphetamine-dextroamphetamine) .... 2 by mouth qd 3)  Zoloft Tabs (Sertraline hcl tabs) .... 100mg  2 by mouth qd 4)  Lamictal Tabs (Lamotrigine tabs) .... 300mg  qd 5)  Vitamin C  6)  B Complex-b12 Tabs (B complex vitamins) 7)  Depo-testosterone 200 Mg/ml Oil (Testosterone cypionate) .... 1.5 cc every 2 weeks 8)  Asa 81mg   9)  Syringes For Im Testosterone Injections  .... As directed 10)  Wellbutrin Xl 150 Mg Xr24h-tab (Bupropion hcl) .... Once daily  Patient Instructions: 1)  Please schedule a follow-up appointment in 4  to 5 months .   Current Allergies (reviewed today): No known allergies

## 2010-06-21 NOTE — Letter (Signed)
Summary: CMN for CPAP/[facility]  CMN for CPAP/[facility]   Imported By: Lanelle Bal 04/11/2010 10:58:37  _____________________________________________________________________  External Attachment:    Type:   Image     Comment:   External Document

## 2010-06-21 NOTE — Progress Notes (Signed)
Summary: NEED LAB ORDERS  FOR 2/22  Phone Note Call from Patient   Caller: Patient Summary of Call: PATIENT RETURNED CALL FROM [facility] ABOUT NEEDING TO RESCHEDULE HIS TUESDAY MORNING APPOINTMENT---HE WAS RESCHEDULED FOR FASTING LABS ON 2/22 AND THEN A CPX ON FRIDAY 2/25  PLEASE LET ME KNOW WHAT LAB ORDERS I SHOULD PUT IN FOR 2/22 AT 10:00  Initial call taken by: Jerolyn Shin,  July 08, 2009 4:03 PM  Follow-up for Phone Call        did you want to recheck his testosterone level? Shary Decamp  July 08, 2009 4:09 PM  testosterone level----Dx  257.2 PSA, BMP, LFTs, CBC, FLP----dx  v70.0 A1c  dx----790.29 Jose E. Paz MD  July 09, 2009 10:17 AM     Additional Follow-up for Phone Call Additional follow up Details #2::    scheduled labs Follow-up by: Barb Merino,  July 09, 2009 2:27 PM

## 2010-06-21 NOTE — Progress Notes (Signed)
Summary: REFILL REQUEST  Phone Note Refill Request Call back at [phone] Message from:  Pharmacy on November 23, 2009 11:44 AM  Refills Requested: Medication #1:  DEPO-TESTOSTERONE 200 MG/ML OIL 1.5 cc every 2 weeks   Dosage confirmed as above?Dosage Confirmed   Supply Requested: 1 month   Last Refilled: 07/12/2009 [facility]  Next Appointment Scheduled: OCTOBER 25TH 2011 Initial call taken by: Lavell Islam,  November 23, 2009 11:46 AM  Follow-up for Phone Call        does pt need his Testoreone level rechecked? Army Fossa CMA  November 23, 2009 12:08 PM  ok RF  x 6 months testosterone check on RTC [place] E. Akeria Hedstrom MD  November 23, 2009 1:02 PM     Prescriptions: DEPO-TESTOSTERONE 200 MG/ML OIL (TESTOSTERONE CYPIONATE) 1.5 cc every 2 weeks  #12 x 6   Entered by:   Army Fossa CMA   Authorized by:   Nolon Rod. Wynell Halberg MD   Signed by:   Army Fossa CMA on 11/23/2009   Method used:   Printed then faxed to ...       [facility]* (retail)       [address]       [address] Shipping/mailing       [place], [place]  [address]       Ph: [phone]       Fax: [phone]   RxID:   [id number]

## 2010-08-08 LAB — POCT I-STAT 4, (NA,K, GLUC, HGB,HCT)
Glucose, Bld: 88 mg/dL (ref 70–99)
HCT: 52 % (ref 39.0–52.0)
Hemoglobin: 17.7 g/dL — ABNORMAL HIGH (ref 13.0–17.0)
Potassium: 4.5 mEq/L (ref 3.5–5.1)
Sodium: 138 mEq/L (ref 135–145)

## 2010-09-12 ENCOUNTER — Telehealth: Payer: Self-pay | Admitting: *Deleted

## 2010-09-12 ENCOUNTER — Telehealth: Payer: Self-pay | Admitting: Internal Medicine

## 2010-09-12 NOTE — Telephone Encounter (Signed)
Please call pt, due for OV.

## 2010-09-12 NOTE — Telephone Encounter (Signed)
Please call pt and schedule an appt

## 2010-09-20 ENCOUNTER — Encounter: Payer: Self-pay | Admitting: Internal Medicine

## 2010-09-20 NOTE — Telephone Encounter (Signed)
Left messages for patient to call and make appointment, mailed letter today on 5/1 asking him to call and make appt

## 2010-10-04 NOTE — Op Note (Signed)
NAMEORDELL, PRICHETT NO.:  [id number]   MEDICAL RECORD NO.:  [id number]          PATIENT TYPE:  AMB   LOCATION:  DAY                          FACILITY:  [facility]   PHYSICIAN:  Ronald A. Gioffre, M.D.DATE OF BIRTH:  [birth year]   DATE OF PROCEDURE:  DATE OF DISCHARGE:                               OPERATIVE REPORT   PREOPERATIVE DIAGNOSES:  1. Degenerative arthritis left knee.  2. Torn medial meniscus, left knee.   POSTOPERATIVE DIAGNOSES:  1. Degenerative arthritis left knee.  2. Torn medial meniscus left knee.   OPERATIONS:  1. Diagnostic arthroscopy, left knee.  2. Medial meniscectomy posterior horn left knee.  3. Abrasion chondroplasty medial femoral condyle left knee.  4. Synovectomy suprapatellar pouch left knee.   DESCRIPTION OF PROCEDURE:  Under general anesthesia, routine orthopedic  prep and draping of the left lower extremity was carried out.  He was  given 2 grams of IV Ancef preop.  At this time, a small punctate  incision was made suprapatellar pouch and inflow cannula was inserted  and the knee was distended with saline.  Another small punctate incision  was made in the anterolateral joint.  The arthroscope was entered from  the lateral approach and a complete diagnostic arthroscopy was carried  out.  He had severe synovitis in the suprapatellar pouch.  I introduced  the ArthroCare and did a synovectomy of the suprapatellar pouch.  I went  down in the lateral joint.  The lateral joint looked fine.  The lateral  meniscus was intact.  The cruciates were intact.  The medial joint was  the main issue.  He had severe degenerative arthritic changes in the  medial joint.  The cartilaginous surface of the distal femur was  literally peeling off like an orange peel.  I introduced the ArthroCare  and the shaver suction device and did an abrasion chondroplasty.  Following that, I went down and approached the medial meniscus.  The  posterior horn was torn.   I introduced the ArthroCare and did a partial  medial meniscectomy of the posterior horn.  Note, he had rather  significant arthritis in the knee.  I thoroughly irrigated out the knee,  removed the fluid and closed all three punctate incision with 3-0 nylon  suture.  I injected 20 mL of half-percent Marcaine with epinephrine into  the knee joint.  A sterile Neosporin bundle dressing was applied.  Following that, the patient left the operative room in satisfactory  position.   POSTOP:  1. He will be on Percocet 10/650, 1 every 4 hours p.r.n. for pain.  2. Aspirin 325 mg b.i.d. a day and b.i.d. for 2 weeks as an      anticoagulant.  3. He will be on crutches partial to full-weightbearing as tolerated.  4. He will see me in the office in 12-14 days for suture removal.           ______________________________  Georges Lynch. Darrelyn Hillock, M.D.     RAG/MEDQ  D:  05/25/2008  T:  05/25/2008  Job:  [id number]

## 2010-10-07 NOTE — Op Note (Signed)
[name], James Boone NO.:  [id number]   MEDICAL RECORD NO.:  [id number]          PATIENT TYPE:  AMB   LOCATION:  DAY                          FACILITY:  [facility]   PHYSICIAN:  Thomas A. Cornett, M.D.DATE OF BIRTH:  [birth year]   DATE OF PROCEDURE:  02/23/2005  DATE OF DISCHARGE:                                 OPERATIVE REPORT   PREOPERATIVE DIAGNOSIS:  Umbilical hernia.   POSTOPERATIVE DIAGNOSIS:  Umbilical hernia.   PROCEDURE:  Umbilical hernia repair.   SURGEON:  Maisie Fus A. Cornett, M.D.   ANESTHESIA:  General endotracheal anesthesia with 20 mL of 0.25% Sensorcaine  local.   ESTIMATED BLOOD LOSS:  10 mL.   SPECIMENS:  None.   INDICATIONS FOR PROCEDURE:  The patient is a 50 year old male with  progressively enlarging umbilical hernia. It has been giving him pain and he  wished to have it repaired on this basis. After discussion of the procedure,  he agreed to proceed understanding the risks, benefits and long-term  outcomes.   DESCRIPTION OF PROCEDURE:  The patient was brought to the operating suite  and placed supine. After induction of general endotracheal esthesia, the  periumbilical region was prepped and draped in a sterile fashion. I  infiltrated the umbilicus with 0.25% Sensorcaine with epinephrine and local  anesthesia in the subcutaneous tissues. An incision was made just below the  umbilicus in a horizontal fashion. Dissection was carried down and I used a  hemostat to dissect around the hernia sac which was adherent to the  undersurface of the umbilical skin. I was able to peel this away, open the  sac and found a small piece of omental fat which I reduced back into the  abdominal cavity. It was viable with no signs of incarceration or  strangulation. Once I did this, I was able to dissect the umbilicus off the  fascia and define the fascial defect circumferentially. Once I did this, it  measured less than the size of my fifth digit and I felt  that primary  closure would be the best means of dealing with this today. A zero Novofil  was used to close the defect, taking at least a 1 to 1 1/2 cm bit of  fascia  on both sides. Three stitches were used to close this. These were then tied  together. The defect was closed satisfactorily with no tension.. At this  point in time, I tacked the umbilical skin down to the fascia with a 2-0  Vicryl. 4-0 Monocryl was used to close the skin incision. All  sponge, needle and instrument counts were counted and found to be correct at  this portion of the case. Steri-Strips and dry dressings were applied. All  final counts were correct. The patient was awoke taken to recovery in  satisfactory condition.      Thomas A. Cornett, M.D.  Electronically Signed     TAC/MEDQ  D:  02/23/2005  T:  02/23/2005  Job:  [id number]   cc:   Wanda Plump, MD [facility]  [phone] [place], [place]  [address]   [facility]

## 2010-12-07 ENCOUNTER — Other Ambulatory Visit: Payer: Self-pay | Admitting: Internal Medicine

## 2010-12-07 NOTE — Telephone Encounter (Signed)
Per Dr.Paz do not fill Testosterone, cancelled rx.

## 2010-12-07 NOTE — Telephone Encounter (Signed)
Call 1 month of lopressor, no further RF w./o OV, last OV > 1 year ago

## 2010-12-08 NOTE — Telephone Encounter (Signed)
Patient made appt for followup for 12/13/2010 at 3:45

## 2010-12-13 ENCOUNTER — Encounter: Payer: Self-pay | Admitting: Internal Medicine

## 2010-12-13 ENCOUNTER — Ambulatory Visit (INDEPENDENT_AMBULATORY_CARE_PROVIDER_SITE_OTHER): Payer: 59 | Admitting: Internal Medicine

## 2010-12-13 VITALS — BP 142/86 | HR 71 | Wt 303.8 lb

## 2010-12-13 DIAGNOSIS — R7309 Other abnormal glucose: Secondary | ICD-10-CM

## 2010-12-13 DIAGNOSIS — E291 Testicular hypofunction: Secondary | ICD-10-CM

## 2010-12-13 DIAGNOSIS — I10 Essential (primary) hypertension: Secondary | ICD-10-CM

## 2010-12-13 DIAGNOSIS — G4730 Sleep apnea, unspecified: Secondary | ICD-10-CM

## 2010-12-13 DIAGNOSIS — F329 Major depressive disorder, single episode, unspecified: Secondary | ICD-10-CM

## 2010-12-13 DIAGNOSIS — R739 Hyperglycemia, unspecified: Secondary | ICD-10-CM

## 2010-12-13 MED ORDER — TESTOSTERONE CYPIONATE 200 MG/ML IM SOLN: INTRAMUSCULAR | Status: DC

## 2010-12-13 NOTE — Assessment & Plan Note (Addendum)
Good compliance w/ depo testosterone 1.5 cc q 2 weeks, last shot ~ 2 weeks ago, labs including a testosterone level and a PSA

## 2010-12-13 NOTE — Assessment & Plan Note (Signed)
Reports he is using the CPAP more consistent

## 2010-12-13 NOTE — Progress Notes (Signed)
  Subjective:    Patient ID: James Boone, male    DOB: Oct 27, 1960, 50 y.o.   MRN: [id number]  HPI ROV, feeling well   Past Medical History  Diagnosis Date  . Hyperlipidemia   . Depression   . Hypertension   . Hypogonadism male   . ADD (attention deficit disorder)   . Abnormal liver function test   . Sleep apnea 2005    aprox 2005, has a CPAP but doesnt use it often  . Allergic rhinitis   . Hyperglycemia    Past Surgical History  Procedure Date  . Vasectomy   . Umbilical hernia repair   . Knee arthroscopy 05/2008    left, Dr.Geoffrey  . Knee arthroscopy 09/2009    right, Dr.Geoffrey     Review of Systems Good medication compliance with BP meds. Ambulatory BPs around 120/80 Last testosterone shot 2 weeks ago, needs a refill. As far as his depression, he is followed by psychiatry, reports good medication compliance and good symptom control. Using his CPAP more consistently    Objective:   Physical Exam  Constitutional: He is oriented to person, place, and time. He appears well-developed and well-nourished.  Cardiovascular: Normal rate, regular rhythm and normal heart sounds.   No murmur heard. Pulmonary/Chest: Effort normal and breath sounds normal. No respiratory distress. He has no wheezes. He has no rales.  Musculoskeletal: He exhibits no edema.  Neurological: He is alert and oriented to person, place, and time.          Assessment & Plan:

## 2010-12-13 NOTE — Assessment & Plan Note (Signed)
Reports good medication compliance and ambulatory BPs in the 120/80 range

## 2010-12-13 NOTE — Assessment & Plan Note (Signed)
Followup by psychiatry, reportedly well controlled

## 2010-12-13 NOTE — Assessment & Plan Note (Signed)
Last A1C 5.8 ~ 1 year ago, labs

## 2010-12-14 LAB — HEMOGLOBIN A1C: Hgb A1c MFr Bld: 6 % (ref 4.6–6.5)

## 2010-12-14 LAB — PSA: PSA: 1.08 ng/mL (ref 0.10–4.00)

## 2011-01-18 ENCOUNTER — Other Ambulatory Visit: Payer: Self-pay | Admitting: Internal Medicine

## 2011-02-24 LAB — CBC
HCT: 43.9 % (ref 39.0–52.0)
Hemoglobin: 14.4 g/dL (ref 13.0–17.0)
MCHC: 32.9 g/dL (ref 30.0–36.0)
MCV: 82.3 fL (ref 78.0–100.0)
Platelets: 219 10*3/uL (ref 150–400)
RBC: 5.33 MIL/uL (ref 4.22–5.81)
RDW: 16.7 % — ABNORMAL HIGH (ref 11.5–15.5)
WBC: 6.1 10*3/uL (ref 4.0–10.5)

## 2011-02-24 LAB — COMPREHENSIVE METABOLIC PANEL
ALT: 33 U/L (ref 0–53)
AST: 31 U/L (ref 0–37)
Albumin: 4.1 g/dL (ref 3.5–5.2)
Alkaline Phosphatase: 98 U/L (ref 39–117)
BUN: 17 mg/dL (ref 6–23)
CO2: 31 mEq/L (ref 19–32)
Calcium: 9.6 mg/dL (ref 8.4–10.5)
Chloride: 103 mEq/L (ref 96–112)
Creatinine, Ser: 1.08 mg/dL (ref 0.4–1.5)
GFR calc Af Amer: >60 mL/min (ref >60)
GFR calc non Af Amer: >60 mL/min (ref >60)
Glucose, Bld: 89 mg/dL (ref 70–99)
Potassium: 4.5 mEq/L (ref 3.5–5.1)
Sodium: 138 mEq/L (ref 135–145)
Total Bilirubin: 0.8 mg/dL (ref 0.3–1.2)
Total Protein: 7 g/dL (ref 6.0–8.3)

## 2011-04-17 ENCOUNTER — Ambulatory Visit (INDEPENDENT_AMBULATORY_CARE_PROVIDER_SITE_OTHER): Payer: 59 | Admitting: Internal Medicine

## 2011-04-17 ENCOUNTER — Encounter: Payer: Self-pay | Admitting: Internal Medicine

## 2011-04-17 DIAGNOSIS — E291 Testicular hypofunction: Secondary | ICD-10-CM

## 2011-04-17 DIAGNOSIS — R7309 Other abnormal glucose: Secondary | ICD-10-CM

## 2011-04-17 DIAGNOSIS — E785 Hyperlipidemia, unspecified: Secondary | ICD-10-CM

## 2011-04-17 DIAGNOSIS — Z0000 Encounter for general adult medical examination without abnormal findings: Secondary | ICD-10-CM

## 2011-04-17 DIAGNOSIS — R739 Hyperglycemia, unspecified: Secondary | ICD-10-CM

## 2011-04-17 LAB — COMPREHENSIVE METABOLIC PANEL
ALT: 40 U/L (ref 0–53)
AST: 41 U/L — ABNORMAL HIGH (ref 0–37)
Albumin: 4.1 g/dL (ref 3.5–5.2)
BUN: 21 mg/dL (ref 6–23)
Calcium: 9.5 mg/dL (ref 8.4–10.5)
Chloride: 102 mEq/L (ref 96–112)
Creatinine, Ser: 1.2 mg/dL (ref 0.4–1.5)
GFR: 68.84 mL/min (ref >60.00)
Glucose, Bld: 97 mg/dL (ref 70–99)
Potassium: 4.6 mEq/L (ref 3.5–5.1)
Sodium: 140 mEq/L (ref 135–145)
Total Bilirubin: 0.6 mg/dL (ref 0.3–1.2)
Total Protein: 6.9 g/dL (ref 6.0–8.3)

## 2011-04-17 LAB — HEMOGLOBIN A1C: Hgb A1c MFr Bld: 6 % (ref 4.6–6.5)

## 2011-04-17 LAB — CBC WITH DIFFERENTIAL/PLATELET
Basophils Absolute: 0 10*3/uL (ref 0.0–0.1)
Basophils Relative: 0.6 % (ref 0.0–3.0)
Eosinophils Relative: 4.7 % (ref 0.0–5.0)
HCT: 48.2 % (ref 39.0–52.0)
Hemoglobin: 16.3 g/dL (ref 13.0–17.0)
Lymphocytes Relative: 27 % (ref 12.0–46.0)
Lymphs Abs: 1.5 10*3/uL (ref 0.7–4.0)
MCV: 86.8 fl (ref 78.0–100.0)
Monocytes Absolute: 0.6 10*3/uL (ref 0.1–1.0)
Monocytes Relative: 11.3 % (ref 3.0–12.0)
Neutro Abs: 3.1 10*3/uL (ref 1.4–7.7)
Neutrophils Relative %: 56.4 % (ref 43.0–77.0)
RBC: 5.55 Mil/uL (ref 4.22–5.81)
WBC: 5.4 10*3/uL (ref 4.5–10.5)

## 2011-04-17 LAB — LIPID PANEL
Cholesterol: 192 mg/dL (ref 0–200)
Total CHOL/HDL Ratio: 4
Triglycerides: 114 mg/dL (ref 0.0–149.0)
VLDL: 22.8 mg/dL (ref 0.0–40.0)

## 2011-04-17 NOTE — Assessment & Plan Note (Addendum)
At some point he ran out of meds however he is back on shots, last one 2 weeks ago Labs

## 2011-04-17 NOTE — Assessment & Plan Note (Addendum)
Td 2003 Flu shot: declined, explained benefits  he has a positive family history of heart dz: on ASA 08-2009 colonoscopy-- normal, next 10 years  Labs  Advised to increase  exercise Discussed healthy diet

## 2011-04-17 NOTE — Assessment & Plan Note (Signed)
Never tried a cholesterol med, labs

## 2011-04-17 NOTE — Assessment & Plan Note (Signed)
Diet-exercise! Labs

## 2011-04-17 NOTE — Progress Notes (Signed)
  Subjective:    Patient ID: James Boone, male    DOB: 12-Dec-1960, 50 y.o.   MRN: [id number]  HPI CPX  Past Medical History  Diagnosis Date  . Hyperlipidemia   . Depression   . Hypertension   . Hypogonadism male   . ADD (attention deficit disorder)   . Abnormal liver function test   . Sleep apnea 2005    aprox 2005, on CPAP as of 2012  . Allergic rhinitis   . Hyperglycemia    Past Surgical History  Procedure Date  . Vasectomy   . Umbilical hernia repair   . Knee arthroscopy 05/2008    left, Dr.Geoffrey  . Knee arthroscopy 09/2009    right, Dr.Geoffrey   History   Social History  . Marital Status: Married    Spouse Name: N/A    Number of Children: 2  . 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 Smoking status: Never Smoker   . Smokeless tobacco: Never Used  . Alcohol Use: Yes     socially   . Drug Use: No  . Sexually Active: Not on file   Other Topics Concern  . Not on file   Social History Narrative   Diet-- regular---Exercise: walk the dogs , not much   Family History  Problem Relation Age of Onset  . Depression    . Stroke Father   . Coronary artery disease Father     had a MI and a CABG at 8 y/o  . Colon cancer Neg Hx   . Prostate cancer Neg Hx   . Diabetes Neg Hx     Review of Systems  Constitutional: Negative for fatigue.  Respiratory: Negative for cough and shortness of breath.        Using CPAP regularly   Cardiovascular: Negative for chest pain and leg swelling.  Gastrointestinal: Negative for abdominal pain and blood in stool.  Genitourinary: Negative for dysuria and difficulty urinating.       Objective:   Physical Exam  Constitutional: He is oriented to person, place, and time. He appears well-developed.  HENT:  Head: Normocephalic and atraumatic.  Neck:       Normal carotid pulses  Cardiovascular: Normal rate, regular rhythm and normal heart sounds.   No murmur  heard. Pulmonary/Chest: Effort normal and breath sounds normal. No respiratory distress. He has no wheezes. He has no rales.  Abdominal: Soft. Bowel sounds are normal. He exhibits no distension. There is no tenderness. There is no rebound and no guarding.  Genitourinary: Rectum normal and prostate normal.  Musculoskeletal: He exhibits no edema.  Neurological: He is alert and oriented to person, place, and time.  Skin: He is not diaphoretic.  Psychiatric: He has a normal mood and affect. His behavior is normal. Judgment and thought content normal.      Assessment & Plan:

## 2011-04-18 LAB — TESTOSTERONE, FREE, TOTAL, SHBG: Testosterone-% Free: 2.4 % (ref 1.6–2.9)

## 2011-04-26 ENCOUNTER — Telehealth: Payer: Self-pay

## 2011-04-26 MED ORDER — ATORVASTATIN CALCIUM 10 MG PO TABS: 10.0000 mg | ORAL_TABLET | Freq: Every day | ORAL | Status: DC

## 2011-04-26 NOTE — Telephone Encounter (Signed)
Message copied by Beverely Low on Wed Apr 26, 2011  9:18 AM ------      Message from: [name] E      Created: Sun Apr 23, 2011  2:02 PM       Advise patient:      Mild diabetes well controlled      Testosterone ok, continue same dose       Cholesterol needs improvement, start lopitor 10 mg 1 po qhs #30, 6 RF (no interaction w/ current meds )      Arrange for a FLP AST ALT  In 6 weeks

## 2011-04-26 NOTE — Telephone Encounter (Signed)
Pt aware of results. Labs mailed, Rx sent to pharmacy and future orders placed. Pt to call and schedule lab appt

## 2011-04-26 NOTE — Progress Notes (Signed)
Addended by: Beverely Low on: 04/26/2011 09:36 AM   Modules accepted: Orders

## 2011-06-02 ENCOUNTER — Other Ambulatory Visit (INDEPENDENT_AMBULATORY_CARE_PROVIDER_SITE_OTHER): Payer: 59

## 2011-06-02 DIAGNOSIS — E785 Hyperlipidemia, unspecified: Secondary | ICD-10-CM

## 2011-06-02 LAB — LIPID PANEL
Cholesterol: 138 mg/dL (ref 0–200)
LDL Cholesterol: 74 mg/dL (ref 0–99)
Triglycerides: 94 mg/dL (ref 0.0–149.0)
VLDL: 18.8 mg/dL (ref 0.0–40.0)

## 2011-06-12 ENCOUNTER — Other Ambulatory Visit: Payer: Self-pay | Admitting: Internal Medicine

## 2011-06-12 MED ORDER — TESTOSTERONE CYPIONATE 200 MG/ML IM SOLN: INTRAMUSCULAR | Status: DC

## 2011-06-12 NOTE — Telephone Encounter (Signed)
Done

## 2011-07-24 ENCOUNTER — Other Ambulatory Visit: Payer: Self-pay | Admitting: Internal Medicine

## 2011-07-24 NOTE — Telephone Encounter (Signed)
Refill done.  

## 2011-10-17 ENCOUNTER — Ambulatory Visit: Payer: 59 | Admitting: Internal Medicine

## 2011-10-18 ENCOUNTER — Ambulatory Visit (INDEPENDENT_AMBULATORY_CARE_PROVIDER_SITE_OTHER): Payer: 59 | Admitting: Internal Medicine

## 2011-10-18 VITALS — BP 142/80 | HR 67 | Temp 98.1°F | Wt 295.0 lb

## 2011-10-18 DIAGNOSIS — M1990 Unspecified osteoarthritis, unspecified site: Secondary | ICD-10-CM | POA: Insufficient documentation

## 2011-10-18 DIAGNOSIS — R739 Hyperglycemia, unspecified: Secondary | ICD-10-CM

## 2011-10-18 DIAGNOSIS — R7309 Other abnormal glucose: Secondary | ICD-10-CM

## 2011-10-18 DIAGNOSIS — E785 Hyperlipidemia, unspecified: Secondary | ICD-10-CM

## 2011-10-18 DIAGNOSIS — E291 Testicular hypofunction: Secondary | ICD-10-CM

## 2011-10-18 NOTE — Assessment & Plan Note (Signed)
Great response to Lipitor. Check LFTs

## 2011-10-18 NOTE — Assessment & Plan Note (Signed)
Has knee pain bilaterally, orthopedic doctors Dr. Juliene Pina, status post arthroscopies bilaterally per pt , long-term plan is knee replacements at some point. Pain is limiting his ability to exercise

## 2011-10-18 NOTE — Assessment & Plan Note (Signed)
Reports a good compliance with medication, check a testosterone level.

## 2011-10-18 NOTE — Assessment & Plan Note (Addendum)
Check A1c, not improving his lifestyle---> counseled again understanding that his exercise capacity is limited by knee DJD.

## 2011-10-19 ENCOUNTER — Encounter: Payer: Self-pay | Admitting: Internal Medicine

## 2011-10-19 LAB — AST: AST: 44 U/L — ABNORMAL HIGH (ref 0–37)

## 2011-10-19 LAB — TESTOSTERONE, FREE, TOTAL, SHBG
Testosterone-% Free: 2.9 % (ref 1.6–2.9)
Testosterone: 1316.49 ng/dL — ABNORMAL HIGH (ref 300–890)

## 2011-10-19 NOTE — Progress Notes (Signed)
  Subjective:    Patient ID: James Boone, male    DOB: 04/17/1961, 51 y.o.   MRN: [id number]  HPI Routine office visit Feeling well, good compliance with all medications, no apparent side effects.  Past Medical History  Diagnosis Date  . Hyperlipidemia   . Depression   . Hypertension   . Hypogonadism male   . ADD (attention deficit disorder)   . Abnormal liver function test   . Sleep apnea 2005    aprox 2005, on CPAP as of 2012  . Allergic rhinitis   . Hyperglycemia       Review of Systems Good compliance with HRT. Denies any myalgias, he does have some DJD, sees orthopedic surgery. See assessment and plan. As far as diabetes, has not improve his lifestyle much since her last office visit.  He sees a psychiatrist, symptoms currently well controlled.    Objective:   Physical Exam General -- alert, well-developed, and overweight appearing. No apparent distress.  Lungs -- normal respiratory effort, no intercostal retractions, no accessory muscle use, and normal breath sounds.   Heart-- normal rate, regular rhythm, no murmur, and no gallop.   Extremities-- trace pretibial edema bilaterally  Neurologic-- alert & oriented X3 and strength normal in all extremities. Psych-- Cognition and judgment appear intact. Alert and cooperative with normal attention span and concentration.  not anxious appearing and not depressed appearing.          Assessment & Plan:

## 2011-10-31 ENCOUNTER — Encounter: Payer: Self-pay | Admitting: *Deleted

## 2011-12-11 ENCOUNTER — Other Ambulatory Visit: Payer: Self-pay | Admitting: Internal Medicine

## 2011-12-12 NOTE — Telephone Encounter (Signed)
Refill done.  

## 2012-01-22 ENCOUNTER — Telehealth: Payer: Self-pay | Admitting: Internal Medicine

## 2012-01-22 NOTE — Telephone Encounter (Signed)
Arrange labs: Testosterone, free testosterone DX hypogonadism

## 2012-01-24 NOTE — Telephone Encounter (Signed)
LMOVM for for pt to return call to schedule lab appt.

## 2012-01-31 ENCOUNTER — Encounter: Payer: Self-pay | Admitting: *Deleted

## 2012-01-31 NOTE — Telephone Encounter (Signed)
LMOVM for pt to return call to schedule lab appt. Unable to get in touch with pt, mailed letter.

## 2012-03-07 ENCOUNTER — Telehealth: Payer: Self-pay

## 2012-03-07 NOTE — Telephone Encounter (Signed)
Pt called in to schedule lab appt to discuss lab work already done and what is still needed with Paz. Referred call to stephanie to schedule appt.     MW

## 2012-03-15 ENCOUNTER — Other Ambulatory Visit: Payer: Self-pay

## 2012-03-15 MED ORDER — METOPROLOL TARTRATE 50 MG PO TABS: 50.0000 mg | ORAL_TABLET | Freq: Two times a day (BID) | ORAL | Status: DC

## 2012-03-15 NOTE — Telephone Encounter (Signed)
Rx sent.    MW 

## 2012-03-19 ENCOUNTER — Telehealth: Payer: Self-pay | Admitting: Internal Medicine

## 2012-03-19 MED ORDER — METOPROLOL TARTRATE 50 MG PO TABS: 50.0000 mg | ORAL_TABLET | Freq: Two times a day (BID) | ORAL | Status: DC

## 2012-03-19 NOTE — Telephone Encounter (Signed)
per patient he no longer uses [facility] he uses [facility]--[facility] [address]--per patient noted that on voicemail left, can you correct for Metoprolol pt cb# [phone]

## 2012-03-19 NOTE — Telephone Encounter (Signed)
Spoke with brian @  out pt pharmacy & cancelled rx that was sent in yesterday. Re-sent rx to correct pharmacy.

## 2012-04-04 ENCOUNTER — Telehealth: Payer: Self-pay

## 2012-04-04 NOTE — Telephone Encounter (Signed)
Message left on triage voicemail: patient left message (unable to make out more than 1/2 message due to connection), the only part I was able to make out was the word testosterone, DOB and phone number.  I called patient and left message for him to call and clarify message

## 2012-04-05 NOTE — Telephone Encounter (Signed)
lmovm for pt to return call.  

## 2012-04-08 ENCOUNTER — Other Ambulatory Visit: Payer: Self-pay | Admitting: *Deleted

## 2012-04-08 NOTE — Telephone Encounter (Signed)
Pt came into office requesting a refill for testosterone cyp 200mg /mL. Last OV 5.29.13. Last filled 1.21.13. OK to refill?

## 2012-04-09 MED ORDER — TESTOSTERONE CYPIONATE 200 MG/ML IM SOLN: INTRAMUSCULAR | Status: DC

## 2012-04-09 NOTE — Telephone Encounter (Signed)
Refill done.pt made aware.  

## 2012-04-09 NOTE — Telephone Encounter (Signed)
Ok to RF testosterone, please note   that his dose has decreased to 1 mL every 2 weeks. He is due for an office visit and labs, please arrange a office visit.

## 2012-04-09 NOTE — Telephone Encounter (Signed)
Refill done.  

## 2012-04-16 MED ORDER — TESTOSTERONE CYPIONATE 200 MG/ML IM SOLN: INTRAMUSCULAR | Status: DC

## 2012-04-16 NOTE — Addendum Note (Signed)
Addended by: Edwena Felty T on: 04/16/2012 04:13 PM   Modules accepted: Orders

## 2012-04-16 NOTE — Telephone Encounter (Signed)
Spoke with pharmacy. They received the rx & is ready to be picked up but they would like for the pt to bring a hard copy. Re-printed rx & given to pt.

## 2012-04-26 ENCOUNTER — Ambulatory Visit (INDEPENDENT_AMBULATORY_CARE_PROVIDER_SITE_OTHER): Payer: 59 | Admitting: Internal Medicine

## 2012-04-26 ENCOUNTER — Encounter: Payer: Self-pay | Admitting: Internal Medicine

## 2012-04-26 VITALS — BP 134/84 | HR 66 | Temp 97.8°F | Ht 76.25 in | Wt 294.0 lb

## 2012-04-26 DIAGNOSIS — Z23 Encounter for immunization: Secondary | ICD-10-CM

## 2012-04-26 DIAGNOSIS — Z0000 Encounter for general adult medical examination without abnormal findings: Secondary | ICD-10-CM

## 2012-04-26 DIAGNOSIS — E785 Hyperlipidemia, unspecified: Secondary | ICD-10-CM

## 2012-04-26 DIAGNOSIS — E291 Testicular hypofunction: Secondary | ICD-10-CM

## 2012-04-26 NOTE — Assessment & Plan Note (Addendum)
Based on the last testosterone level, with decreased testosterone from 1.5 mL to one mL every 2 weeks. Labs

## 2012-04-26 NOTE — Progress Notes (Signed)
Pt will be back on Tuesday 04/30/12 for blood work, since the agent for the Testosterone will be at [place] on the 9th so he decided to wait until then to have lab work done.

## 2012-04-26 NOTE — Progress Notes (Signed)
  Subjective:    Patient ID: James Boone, male    DOB: 03/19/1961, 51 y.o.   MRN: [id number]  HPI Complete physical exam  Past Medical History  Diagnosis Date  . Hyperlipidemia   . Depression   . Hypertension   . Hypogonadism male   . ADD (attention deficit disorder)   . Abnormal liver function test   . Sleep apnea 2005    aprox 2005, on CPAP as of 2012  . Allergic rhinitis   . Hyperglycemia    Past Surgical History  Procedure Date  . Vasectomy   . Umbilical hernia repair   . Knee arthroscopy 05/2008    left, Dr.Geoffrey  . Knee arthroscopy 09/2009    right, Dr.Geoffrey   History   Social History  . Marital Status: Married    Spouse Name: N/A    Number of Children: 2  . Years of Education: N/A   Occupational History  . [employer]     Social History Main Topics  . Smoking status: Never Smoker   . Smokeless tobacco: Never Used  . Alcohol Use: Yes     Comment: socially   . Drug Use: No  . Sexually Active: Not on file   Other Topics Concern  . Not on file   Social History Narrative   Diet: relatively healthy ---Exercise: active at work, no routine exercise ---   Family History  Problem Relation Age of Onset  . Depression Sister     no suicide   . Stroke Father     ag ~ 26  . Coronary artery disease Father     had a MI and a CABG at 55 y/o  . Colon cancer Neg Hx   . Prostate cancer Neg Hx   . Diabetes Neg Hx      Review of Systems Denies chest pain or shortness of breath No nausea, vomiting, diarrhea No dysuria or gross hematuria Good medication compliance Normal ambulatory BPs     Objective:   Physical Exam General -- alert, well-developed, and overweight appearing.   Neck --no thyromegaly  Lungs -- normal respiratory effort, no intercostal retractions, no accessory muscle use, and normal breath sounds.   Heart-- normal rate, regular rhythm, no murmur, and no gallop.   Abdomen--soft, non-tender, no distention, no masses, no HSM, no guarding,  and no rigidity.   Extremities-- no pretibial edema bilaterally Rectal-- No external abnormalities noted. Normal sphincter tone. No rectal masses or tenderness. Brown stool  Prostate:  Prostate gland firm and smooth, no enlargement, nodularity, tenderness, mass, asymmetry or induration. Psych-- Cognition and judgment appear intact. Alert and cooperative with normal attention span and concentration.  not anxious appearing and not depressed appearing.       Assessment & Plan:

## 2012-04-26 NOTE — Assessment & Plan Note (Signed)
Labs drawn at work 02/22/2012: CMP normal, cholesterol 141, HDL 40, LDL 69. Well-controlled, no change

## 2012-04-26 NOTE — Assessment & Plan Note (Addendum)
Td 2003 Flu shot: declined   he has a positive family history of heart dz: on ASA 08-2009 colonoscopy-- normal, next 10 years  Labs drawn at work 02/22/2012: CMP normal, cholesterol 141, HDL 40, LDL 69. CBC, PSA,Udip normal. Advised to increase  exercise Discussed healthy diet

## 2012-04-28 ENCOUNTER — Encounter: Payer: Self-pay | Admitting: Internal Medicine

## 2012-05-01 ENCOUNTER — Other Ambulatory Visit: Payer: 59

## 2012-05-01 DIAGNOSIS — E291 Testicular hypofunction: Secondary | ICD-10-CM

## 2012-05-02 LAB — TESTOSTERONE, FREE, TOTAL, SHBG: Testosterone, Free: 41.4 pg/mL — ABNORMAL LOW (ref 47.0–244.0)

## 2012-05-16 ENCOUNTER — Encounter: Payer: Self-pay | Admitting: *Deleted

## 2012-07-24 ENCOUNTER — Other Ambulatory Visit: Payer: Self-pay | Admitting: Internal Medicine

## 2012-07-24 NOTE — Telephone Encounter (Signed)
Refill done.  

## 2012-08-10 ENCOUNTER — Telehealth: Payer: Self-pay | Admitting: Internal Medicine

## 2012-08-10 NOTE — Telephone Encounter (Signed)
Please schedule labs: Testosterone, dx hypogonadism

## 2012-08-13 NOTE — Telephone Encounter (Signed)
lmovm for pt to call office. °

## 2012-08-19 NOTE — Telephone Encounter (Signed)
Pt scheduled for 08/21/12.

## 2012-08-21 ENCOUNTER — Other Ambulatory Visit: Payer: 59

## 2012-08-21 DIAGNOSIS — E291 Testicular hypofunction: Secondary | ICD-10-CM

## 2012-08-22 LAB — TESTOSTERONE, FREE, TOTAL, SHBG
Sex Hormone Binding: 35 nmol/L (ref 13–71)
Testosterone, Free: 47.5 pg/mL (ref 47.0–244.0)

## 2012-09-23 ENCOUNTER — Other Ambulatory Visit: Payer: Self-pay | Admitting: Internal Medicine

## 2012-09-24 NOTE — Telephone Encounter (Signed)
Ok to refill? Last OV 12.6.13

## 2012-09-24 NOTE — Telephone Encounter (Signed)
Refill done.  

## 2012-09-24 NOTE — Telephone Encounter (Signed)
Okay to refill for 3 months. Next visit and labs by June 2014

## 2012-11-20 ENCOUNTER — Inpatient Hospital Stay: Admit: 2012-11-20 | Payer: Self-pay | Admitting: Orthopedic Surgery

## 2012-11-20 SURGERY — ARTHROPLASTY, KNEE, TOTAL
Anesthesia: General | Site: Knee | Laterality: Left

## 2012-12-16 NOTE — Progress Notes (Signed)
Surgery scheduled for 12/30/12.  Need orders in EPIC.  Thank You.  

## 2012-12-17 ENCOUNTER — Other Ambulatory Visit: Payer: Self-pay | Admitting: Orthopedic Surgery

## 2012-12-17 NOTE — Progress Notes (Signed)
Preoperative surgical orders have been place into the Epic hospital system for James Boone on 12/17/2012, 5:14 PM  by Patrica Duel for surgery on 12/30/2012.  Preop Total Knee orders including Experal, PO Tylenol, and IV Decadron as long as there are no contraindications to the above medications. [name], PA-C

## 2012-12-23 ENCOUNTER — Ambulatory Visit (HOSPITAL_COMMUNITY)
Admission: RE | Admit: 2012-12-23 | Discharge: 2012-12-23 | Disposition: A | Discharge status: Home / Self Care | Payer: 59 | Source: Ambulatory Visit | Attending: Orthopedic Surgery | Admitting: Orthopedic Surgery

## 2012-12-23 ENCOUNTER — Encounter (HOSPITAL_COMMUNITY): Payer: Self-pay | Admitting: Pharmacy Technician

## 2012-12-23 ENCOUNTER — Encounter (HOSPITAL_COMMUNITY): Payer: Self-pay

## 2012-12-23 ENCOUNTER — Encounter (HOSPITAL_COMMUNITY)
Admission: RE | Admit: 2012-12-23 | Discharge: 2012-12-23 | Disposition: A | Discharge status: Home / Self Care | Payer: 59 | Source: Ambulatory Visit | Attending: Orthopedic Surgery | Admitting: Orthopedic Surgery

## 2012-12-23 DIAGNOSIS — Z01818 Encounter for other preprocedural examination: Secondary | ICD-10-CM | POA: Insufficient documentation

## 2012-12-23 DIAGNOSIS — M1710 Unilateral primary osteoarthritis, unspecified knee: Secondary | ICD-10-CM | POA: Insufficient documentation

## 2012-12-23 DIAGNOSIS — Z01812 Encounter for preprocedural laboratory examination: Secondary | ICD-10-CM | POA: Insufficient documentation

## 2012-12-23 DIAGNOSIS — Z01810 Encounter for preprocedural cardiovascular examination: Secondary | ICD-10-CM | POA: Insufficient documentation

## 2012-12-23 DIAGNOSIS — I10 Essential (primary) hypertension: Secondary | ICD-10-CM | POA: Insufficient documentation

## 2012-12-23 HISTORY — DX: Unspecified osteoarthritis, unspecified site: M19.90

## 2012-12-23 LAB — URINALYSIS, ROUTINE W REFLEX MICROSCOPIC
Ketones, ur: NEGATIVE mg/dL
Leukocytes, UA: NEGATIVE
Nitrite: NEGATIVE
Protein, ur: NEGATIVE mg/dL

## 2012-12-23 LAB — CBC
MCV: 85.9 fL (ref 78.0–100.0)
Platelets: 208 10*3/uL (ref 150–400)
RDW: 14.8 % (ref 11.5–15.5)
WBC: 7.1 10*3/uL (ref 4.0–10.5)

## 2012-12-23 LAB — COMPREHENSIVE METABOLIC PANEL
ALT: 37 U/L (ref 0–53)
AST: 38 U/L — ABNORMAL HIGH (ref 0–37)
Albumin: 4.1 g/dL (ref 3.5–5.2)
Calcium: 9.8 mg/dL (ref 8.4–10.5)
Chloride: 102 mEq/L (ref 96–112)
Creatinine, Ser: 1.25 mg/dL (ref 0.50–1.35)
Sodium: 140 mEq/L (ref 135–145)
Total Bilirubin: 0.3 mg/dL (ref 0.3–1.2)

## 2012-12-23 LAB — PROTIME-INR: INR: 1.05 (ref 0.00–1.49)

## 2012-12-23 NOTE — Patient Instructions (Signed)
DEAVIN FORST  12/23/2012   Your procedure is scheduled on:  12/30/12              Surgery [id number]  Report to [facility] at  0515  AM.  Call this number if you have problems the morning of surgery: [phone]   Remember:   Do not eat food or drink liquids after midnight.   Take these medicines the morning of surgery with A SIP OF WATER:    Do not wear jewelry,  Do not wear lotions, powders, or perfumes..   Men may shave face and neck.  Do not bring valuables to the hospital.  Contacts, dentures or bridgework may not be worn into surgery.  Leave suitcase in the car. After surgery it may be brought to your room.  For patients admitted to the hospital, checkout time is 11:00 AM the day of  discharge.   SEE CHG INSTRUCTION SHEET    Please read over the following fact sheets that you were given: MRSA Information, coughing and deep breathing exercises, leg exercises, Blood Transfusion Fact Sheet, Incentive Spirometry Fact sheet                 Failure to comply with these instructions may result in cancellation of your surgery.                Patient Signature ____________________________              Nurse Signature _____________________________

## 2012-12-24 NOTE — Progress Notes (Signed)
Patient called back and stated he had received message regarding nasal pcr swab results.  Also called back to note CPAP settings of 13.0 and 83 fahrenheit.

## 2012-12-29 ENCOUNTER — Other Ambulatory Visit: Payer: Self-pay | Admitting: Orthopedic Surgery

## 2012-12-29 NOTE — H&P (Signed)
James Boone  DOB: 12/20/1960 Married / Language: English / Race: White Male  Date of Admission: 12/30/2012  Chief Complaint:  Left Knee Pain  History of Present Illness The patient is a 51 year old male who comes in for a preoperative History and Physical. The patient is scheduled for a left total knee arthroplasty to be performed by Dr. Frank V. Aluisio, MD at [facility] on 12/30/2012. The patient is a 51 year old male who presents for follow up of their knee. The patient is being followed for their left knee pain. Symptoms reported today include: pain, locking, catching and giving way. The patient feels that they are doing poorly (left knee) and report their pain level to be moderate. The following medication has been used for pain control: none. The patient has not gotten any relief of their symptoms with conservative measures or Cortisone injections. The patient indicates that they have questions or concerns today regarding their progress at this point. The patient was seen in referral from Dr. Gioffre. The patient reports left knee symptoms including: pain which began year(s) ago without any known injury. Note for "Knee pain": The patient is ready to proceed with surgery. They have been treated conservatively in the past for the above stated problem and despite conservative measures, they continue to have progressive pain and severe functional limitations and dysfunction. They have failed non-operative management including home exercise, medications, and injections. It is felt that they would benefit from undergoing total joint replacement. Risks and benefits of the procedure have been discussed with the patient and they elect to proceed with surgery. There are no active contraindications to surgery such as ongoing infection or rapidly progressive neurological disease.  Problem List Osteoarthritis, Knee (715.96)  Allergies No Known Drug Allergies. 10/12/2011   Family  History Heart disease in male family member before age 55 Cerebrovascular Accident. father Depression. sister Heart Disease. father   Social History Exercise. Exercises daily; does running / walking Drug/Alcohol Rehab (Previously). no Drug/Alcohol Rehab (Currently). no Marital status. married Living situation. live with spouse Illicit drug use. no Current work status. working part time Children. 2 Alcohol use. current drinker; drinks beer and wine; only occasionally per week Pain Contract. no Tobacco use. never smoker Tobacco / smoke exposure. no Post-Surgical Plans. Home   Medication History Meloxicam (15MG Tablet, 1 Oral daily, Taken starting 11/11/2012) Active. Adderall XR ( Oral) Specific dose unknown - Active. Vyvanse (70MG Capsule, Oral) Active. Sertraline HCl (100MG Tablet, Oral) Active. BuPROPion HCl ER (XL) (150MG Tablet ER 24HR, Oral) Active. Metoprolol Tartrate (50MG Tablet, Oral) Active. Atorvastatin Calcium (10MG Tablet, Oral) Active. Amphetamine Salt Combo (25MG Capsule ER 24HR, Oral) Active. Testosterone Cypionate ( Intramuscular) Specific dose unknown - Active. LamoTRIgine ER (200MG Tablet ER 24HR, Oral) Active. Aspirin EC (81MG Tablet DR, Oral) Active. Vitamin C ( Oral) Active. Super B Complex ( Oral) Active. Fish Oil + D3 (1200-1000MG-UNIT Capsule, Oral) Active. Glucosamine-Chondroitin DS ( Oral) Active. Multivitamin ( Oral) Active.   Past Surgical History Vasectomy. Date: 1990. Arthroscopy of Knee. bilateral; Right - 2008, Left - 2010 Wisdom Teeth Extraction Abdominal Hernia Repair. Date: 2001.   Medical History Rheumatoid Arthritis Sleep Apnea. uses CPAP High blood pressure Hypercholesterolemia Depression Impaired Vision Hiatal Hernia Hemorrhoids Peripheral Edema   Review of Systems General:Not Present- Chills, Fever, Night Sweats, Fatigue, Weight Gain, Weight Loss and Memory Loss. Skin:Not Present- Hives,  Itching, Rash, Eczema and Lesions. HEENT:Not Present- Tinnitus, Headache, Double Vision, Visual Loss, Hearing Loss and Dentures. Respiratory:Not   Present- Shortness of breath with exertion, Shortness of breath at rest, Allergies, Coughing up blood and Chronic Cough. Cardiovascular:Not Present- Chest Pain, Racing/skipping heartbeats, Difficulty Breathing Lying Down, Murmur, Swelling and Palpitations. Gastrointestinal:Not Present- Bloody Stool, Heartburn, Abdominal Pain, Vomiting, Nausea, Constipation, Diarrhea, Difficulty Swallowing, Jaundice and Loss of appetitie. Male Genitourinary:Not Present- Urinary frequency, Blood in Urine, Weak urinary stream, Discharge, Flank Pain, Incontinence, Painful Urination, Urgency, Urinary Retention and Urinating at Night. Musculoskeletal:Present- Joint Swelling and Joint Pain. Not Present- Muscle Weakness, Muscle Pain, Back Pain, Morning Stiffness and Spasms. Neurological:Not Present- Tremor, Dizziness, Blackout spells, Paralysis, Difficulty with balance and Weakness. Psychiatric:Not Present- Insomnia.   Vitals Weight: 286 lb Height: 77 in Body Surface Area: 2.65 m Body Mass Index: 33.91 kg/m Pulse: 76 (Regular) Resp.: 16 (Unlabored) BP: 118/72 (Sitting, Right Arm, Standard)    Physical Exam The physical exam findings are as follows:   General Mental Status - Alert, cooperative and good historian. General Appearance- pleasant. Not in acute distress. Orientation- Oriented X3. Build & Nutrition- Well nourished and Well developed.   Head and Neck Head- normocephalic, atraumatic . Neck Global Assessment- supple. no bruit auscultated on the right and no bruit auscultated on the left.   Eye Vision- Wears corrective lenses. Pupil- Bilateral- Regular and Round. Motion- Bilateral- EOMI.   Chest and Lung Exam Auscultation: Breath sounds:- clear at anterior chest wall and - clear at posterior chest  wall. Adventitious sounds:- No Adventitious sounds.   Cardiovascular Auscultation:Rhythm- Regular rate and rhythm. Heart Sounds- S1 WNL and S2 WNL. Murmurs & Other Heart Sounds: Murmur 1:Location- Aortic Area. Timing- Early systolic. Grade- II/VI (faint). Character- Low pitched.   Abdomen Inspection:Contour- Generalized mild distention. Palpation/Percussion:Tenderness- Abdomen is non-tender to palpation. Rigidity (guarding)- Abdomen is soft. Auscultation:Auscultation of the abdomen reveals - Bowel sounds normal.   Male Genitourinary Not done, not pertinent to present illness  Musculoskeletal  Examining him, he lacks about 5-8 degrees of extension. He flexes just to about 95 degrees bilaterally. He has no collateral or cruciate ligament instability. The patellae track well in the midline. He has some venostasis changes in his legs, left greater than the right, but that is chronic. His popliteal space is fine. There is no calf tenderness.  Standing, he has a significant genu varus bilaterally  RADIOGRAPHS: X-rays of the knee show bone on bone in the medial compartments with a genu varus.  Assessment & Plan Osteoarthritis, Knee (715.96) Impression: Left Knee  Note: Plan is for a Left Total Knee Replacement by Dr. Aluisio.  Plan is to go home.  The patient does not have any contraindications and will recieve TXA (tranexamic acid) prior to surgery.  Signed electronically by Alexzandrew L Perkins, III PA-C  

## 2012-12-30 ENCOUNTER — Encounter (HOSPITAL_COMMUNITY)
Admission: RE | Disposition: A | Discharge status: Home Health | Payer: Self-pay | Source: Ambulatory Visit | Attending: Orthopedic Surgery

## 2012-12-30 ENCOUNTER — Encounter (HOSPITAL_COMMUNITY): Payer: Self-pay | Admitting: *Deleted

## 2012-12-30 ENCOUNTER — Encounter (HOSPITAL_COMMUNITY): Payer: Self-pay | Admitting: Anesthesiology

## 2012-12-30 ENCOUNTER — Inpatient Hospital Stay (HOSPITAL_COMMUNITY)
Admission: RE | Admit: 2012-12-30 | Discharge: 2013-01-01 | DRG: 470 | Disposition: A | Discharge status: Home Health | Payer: 59 | Source: Ambulatory Visit | Attending: Orthopedic Surgery | Admitting: Orthopedic Surgery

## 2012-12-30 ENCOUNTER — Inpatient Hospital Stay (HOSPITAL_COMMUNITY): Payer: 59 | Admitting: Anesthesiology

## 2012-12-30 DIAGNOSIS — M1710 Unilateral primary osteoarthritis, unspecified knee: Principal | ICD-10-CM | POA: Diagnosis present

## 2012-12-30 DIAGNOSIS — E785 Hyperlipidemia, unspecified: Secondary | ICD-10-CM

## 2012-12-30 DIAGNOSIS — G4730 Sleep apnea, unspecified: Secondary | ICD-10-CM

## 2012-12-30 DIAGNOSIS — Z96652 Presence of left artificial knee joint: Secondary | ICD-10-CM

## 2012-12-30 DIAGNOSIS — I10 Essential (primary) hypertension: Secondary | ICD-10-CM

## 2012-12-30 DIAGNOSIS — F329 Major depressive disorder, single episode, unspecified: Secondary | ICD-10-CM | POA: Diagnosis present

## 2012-12-30 DIAGNOSIS — E871 Hypo-osmolality and hyponatremia: Secondary | ICD-10-CM

## 2012-12-30 DIAGNOSIS — F3289 Other specified depressive episodes: Secondary | ICD-10-CM | POA: Diagnosis present

## 2012-12-30 DIAGNOSIS — Z6835 Body mass index (BMI) 35.0-35.9, adult: Secondary | ICD-10-CM | POA: Diagnosis present

## 2012-12-30 DIAGNOSIS — M179 Osteoarthritis of knee, unspecified: Secondary | ICD-10-CM

## 2012-12-30 HISTORY — PX: TOTAL KNEE ARTHROPLASTY: SHX125

## 2012-12-30 LAB — TYPE AND SCREEN: ABO/RH(D): O POS

## 2012-12-30 SURGERY — ARTHROPLASTY, KNEE, TOTAL
Anesthesia: Spinal | Site: Knee | Laterality: Left | Wound class: Clean

## 2012-12-30 MED ORDER — METOCLOPRAMIDE HCL 10 MG PO TABS: 5.0000 mg | ORAL_TABLET | Freq: Three times a day (TID) | ORAL | Status: DC | PRN

## 2012-12-30 MED ORDER — SODIUM CHLORIDE 0.9 % IR SOLN
Status: DC | PRN
  Administered 2012-12-30: 1000 mL

## 2012-12-30 MED ORDER — BUPIVACAINE HCL 0.25 % IJ SOLN
INTRAMUSCULAR | Status: DC | PRN
  Administered 2012-12-30: 20 mL

## 2012-12-30 MED ORDER — 0.9 % SODIUM CHLORIDE (POUR BTL) OPTIME
TOPICAL | Status: DC | PRN
  Administered 2012-12-30: 1000 mL

## 2012-12-30 MED ORDER — DEXAMETHASONE SODIUM PHOSPHATE 10 MG/ML IJ SOLN: 10.0000 mg | Freq: Once | INTRAMUSCULAR | Status: DC

## 2012-12-30 MED ORDER — METOCLOPRAMIDE HCL 5 MG/ML IJ SOLN: 5.0000 mg | Freq: Three times a day (TID) | INTRAMUSCULAR | Status: DC | PRN

## 2012-12-30 MED ORDER — ONDANSETRON HCL 4 MG PO TABS: 4.0000 mg | ORAL_TABLET | Freq: Four times a day (QID) | ORAL | Status: DC | PRN

## 2012-12-30 MED ORDER — LACTATED RINGERS IV SOLN
INTRAVENOUS | Status: DC | PRN
  Administered 2012-12-30 (×4): via INTRAVENOUS

## 2012-12-30 MED ORDER — BISACODYL 10 MG RE SUPP: 10.0000 mg | Freq: Every day | RECTAL | Status: DC | PRN

## 2012-12-30 MED ORDER — LACTATED RINGERS IV SOLN: INTRAVENOUS | Status: DC

## 2012-12-30 MED ORDER — TRAMADOL HCL 50 MG PO TABS: 50.0000 mg | ORAL_TABLET | Freq: Four times a day (QID) | ORAL | Status: DC | PRN

## 2012-12-30 MED ORDER — TRANEXAMIC ACID 100 MG/ML IV SOLN
1000.0000 mg | INTRAVENOUS | Status: AC
  Administered 2012-12-30: 1000 mg via INTRAVENOUS
  Filled 2012-12-30: qty 10

## 2012-12-30 MED ORDER — SERTRALINE HCL 100 MG PO TABS
200.0000 mg | ORAL_TABLET | Freq: Every morning | ORAL | Status: DC
  Administered 2012-12-31 – 2013-01-01 (×2): 200 mg via ORAL
  Filled 2012-12-30 (×2): qty 2

## 2012-12-30 MED ORDER — DEXAMETHASONE SODIUM PHOSPHATE 10 MG/ML IJ SOLN
10.0000 mg | Freq: Every day | INTRAMUSCULAR | Status: AC
  Filled 2012-12-30: qty 1

## 2012-12-30 MED ORDER — METOPROLOL TARTRATE 50 MG PO TABS
50.0000 mg | ORAL_TABLET | Freq: Two times a day (BID) | ORAL | Status: DC
  Administered 2012-12-30 – 2013-01-01 (×4): 50 mg via ORAL
  Filled 2012-12-30 (×5): qty 1

## 2012-12-30 MED ORDER — DEXTROSE 5 % IV SOLN
3.0000 g | INTRAVENOUS | Status: AC
  Administered 2012-12-30: 3 g via INTRAVENOUS
  Filled 2012-12-30: qty 3000

## 2012-12-30 MED ORDER — ACETAMINOPHEN 500 MG PO TABS
1000.0000 mg | ORAL_TABLET | Freq: Four times a day (QID) | ORAL | Status: AC
  Administered 2012-12-30: 1000 mg via ORAL
  Filled 2012-12-30: qty 2

## 2012-12-30 MED ORDER — ONDANSETRON HCL 4 MG/2ML IJ SOLN: 4.0000 mg | Freq: Four times a day (QID) | INTRAMUSCULAR | Status: DC | PRN

## 2012-12-30 MED ORDER — CEFAZOLIN SODIUM-DEXTROSE 2-3 GM-% IV SOLR
2.0000 g | Freq: Four times a day (QID) | INTRAVENOUS | Status: AC
  Administered 2012-12-30 (×2): 2 g via INTRAVENOUS
  Filled 2012-12-30 (×2): qty 50

## 2012-12-30 MED ORDER — ACETAMINOPHEN 500 MG PO TABS
1000.0000 mg | ORAL_TABLET | Freq: Once | ORAL | Status: AC
  Administered 2012-12-30: 1000 mg via ORAL
  Filled 2012-12-30: qty 2

## 2012-12-30 MED ORDER — OXYCODONE HCL 5 MG PO TABS
5.0000 mg | ORAL_TABLET | ORAL | Status: DC | PRN
  Administered 2012-12-30 (×3): 10 mg via ORAL
  Administered 2012-12-30 (×2): 5 mg via ORAL
  Administered 2012-12-31 – 2013-01-01 (×6): 10 mg via ORAL
  Filled 2012-12-30 (×2): qty 2
  Filled 2012-12-30 (×2): qty 1
  Filled 2012-12-30 (×7): qty 2
  Filled 2012-12-30: qty 1
  Filled 2012-12-30: qty 2

## 2012-12-30 MED ORDER — PROMETHAZINE HCL 25 MG/ML IJ SOLN: 6.2500 mg | INTRAMUSCULAR | Status: DC | PRN

## 2012-12-30 MED ORDER — SODIUM CHLORIDE 0.9 % IV SOLN: INTRAVENOUS | Status: DC

## 2012-12-30 MED ORDER — AMPHETAMINE-DEXTROAMPHET ER 25 MG PO CP24: 50.0000 mg | ORAL_CAPSULE | Freq: Every morning | ORAL | Status: DC

## 2012-12-30 MED ORDER — FLEET ENEMA 7-19 GM/118ML RE ENEM: 1.0000 | ENEMA | Freq: Once | RECTAL | Status: AC | PRN

## 2012-12-30 MED ORDER — MIDAZOLAM HCL 5 MG/5ML IJ SOLN
INTRAMUSCULAR | Status: DC | PRN
  Administered 2012-12-30: 2 mg via INTRAVENOUS

## 2012-12-30 MED ORDER — ATORVASTATIN CALCIUM 10 MG PO TABS
10.0000 mg | ORAL_TABLET | Freq: Every day | ORAL | Status: DC
  Administered 2012-12-30 – 2012-12-31 (×2): 10 mg via ORAL
  Filled 2012-12-30 (×3): qty 1

## 2012-12-30 MED ORDER — RIVAROXABAN 10 MG PO TABS
10.0000 mg | ORAL_TABLET | Freq: Every day | ORAL | Status: DC
  Administered 2012-12-31 – 2013-01-01 (×2): 10 mg via ORAL
  Filled 2012-12-30 (×3): qty 1

## 2012-12-30 MED ORDER — POLYETHYLENE GLYCOL 3350 17 G PO PACK: 17.0000 g | PACK | Freq: Every day | ORAL | Status: DC | PRN

## 2012-12-30 MED ORDER — BUPROPION HCL ER (XL) 150 MG PO TB24
150.0000 mg | ORAL_TABLET | Freq: Every morning | ORAL | Status: DC
  Administered 2012-12-31 – 2013-01-01 (×2): 150 mg via ORAL
  Filled 2012-12-30 (×2): qty 1

## 2012-12-30 MED ORDER — PROPOFOL 10 MG/ML IV BOLUS
INTRAVENOUS | Status: DC | PRN
  Administered 2012-12-30: 30 mg via INTRAVENOUS

## 2012-12-30 MED ORDER — BUPIVACAINE LIPOSOME 1.3 % IJ SUSP
20.0000 mL | Freq: Once | INTRAMUSCULAR | Status: DC
  Filled 2012-12-30: qty 20

## 2012-12-30 MED ORDER — BIOTENE DRY MOUTH MT LIQD
15.0000 mL | Freq: Two times a day (BID) | OROMUCOSAL | Status: DC
  Administered 2012-12-30 – 2013-01-01 (×4): 15 mL via OROMUCOSAL

## 2012-12-30 MED ORDER — DEXAMETHASONE 6 MG PO TABS
10.0000 mg | ORAL_TABLET | Freq: Every day | ORAL | Status: AC
  Administered 2012-12-31: 10 mg via ORAL
  Filled 2012-12-30: qty 1

## 2012-12-30 MED ORDER — PHENOL 1.4 % MT LIQD: 1.0000 | OROMUCOSAL | Status: DC | PRN

## 2012-12-30 MED ORDER — KETOROLAC TROMETHAMINE 15 MG/ML IJ SOLN
15.0000 mg | Freq: Four times a day (QID) | INTRAMUSCULAR | Status: AC | PRN
  Administered 2012-12-30 – 2012-12-31 (×2): 15 mg via INTRAVENOUS
  Filled 2012-12-30 (×2): qty 1

## 2012-12-30 MED ORDER — KCL IN DEXTROSE-NACL 20-5-0.9 MEQ/L-%-% IV SOLN
INTRAVENOUS | Status: DC
  Administered 2012-12-30 (×2): via INTRAVENOUS
  Filled 2012-12-30 (×3): qty 1000

## 2012-12-30 MED ORDER — MENTHOL 3 MG MT LOZG: 1.0000 | LOZENGE | OROMUCOSAL | Status: DC | PRN

## 2012-12-30 MED ORDER — HYDROMORPHONE HCL PF 1 MG/ML IJ SOLN: 0.2500 mg | INTRAMUSCULAR | Status: DC | PRN

## 2012-12-30 MED ORDER — SODIUM CHLORIDE 0.9 % IJ SOLN
INTRAMUSCULAR | Status: DC | PRN
  Administered 2012-12-30: 08:00:00

## 2012-12-30 MED ORDER — MORPHINE SULFATE 2 MG/ML IJ SOLN
1.0000 mg | INTRAMUSCULAR | Status: DC | PRN
  Administered 2012-12-30 (×4): 2 mg via INTRAVENOUS
  Filled 2012-12-30 (×4): qty 1

## 2012-12-30 MED ORDER — DOCUSATE SODIUM 100 MG PO CAPS
100.0000 mg | ORAL_CAPSULE | Freq: Two times a day (BID) | ORAL | Status: DC
  Administered 2012-12-30 – 2013-01-01 (×4): 100 mg via ORAL

## 2012-12-30 MED ORDER — PROPOFOL INFUSION 10 MG/ML OPTIME
INTRAVENOUS | Status: DC | PRN
  Administered 2012-12-30: 160 ug/kg/min via INTRAVENOUS

## 2012-12-30 MED ORDER — DIPHENHYDRAMINE HCL 12.5 MG/5ML PO ELIX: 12.5000 mg | ORAL_SOLUTION | ORAL | Status: DC | PRN

## 2012-12-30 MED ORDER — FENTANYL CITRATE 0.05 MG/ML IJ SOLN
INTRAMUSCULAR | Status: DC | PRN
  Administered 2012-12-30: 50 ug via INTRAVENOUS
  Administered 2012-12-30 (×2): 25 ug via INTRAVENOUS

## 2012-12-30 MED ORDER — CHLORHEXIDINE GLUCONATE 4 % EX LIQD
60.0000 mL | Freq: Once | CUTANEOUS | Status: DC
  Filled 2012-12-30: qty 60

## 2012-12-30 MED ORDER — METHOCARBAMOL 500 MG PO TABS
500.0000 mg | ORAL_TABLET | Freq: Four times a day (QID) | ORAL | Status: DC | PRN
  Administered 2012-12-30 – 2012-12-31 (×3): 500 mg via ORAL
  Filled 2012-12-30 (×4): qty 1

## 2012-12-30 MED ORDER — METHOCARBAMOL 100 MG/ML IJ SOLN
500.0000 mg | Freq: Four times a day (QID) | INTRAVENOUS | Status: DC | PRN
  Filled 2012-12-30: qty 5

## 2012-12-30 MED ORDER — AMPHETAMINE-DEXTROAMPHET ER 10 MG PO CP24
50.0000 mg | ORAL_CAPSULE | Freq: Every day | ORAL | Status: DC
  Administered 2012-12-31 – 2013-01-01 (×2): 50 mg via ORAL
  Filled 2012-12-30: qty 5

## 2012-12-30 MED ORDER — LAMOTRIGINE 150 MG PO TABS
150.0000 mg | ORAL_TABLET | Freq: Every day | ORAL | Status: DC
  Administered 2012-12-31: 150 mg via ORAL
  Filled 2012-12-30 (×3): qty 1

## 2012-12-30 SURGICAL SUPPLY — 56 items
BAG SPEC THK2 15X12 ZIP CLS (MISCELLANEOUS)
BAG ZIPLOCK 12X15 (MISCELLANEOUS) ×1 IMPLANT
BANDAGE ELASTIC 6 VELCRO ST LF (GAUZE/BANDAGES/DRESSINGS) ×2 IMPLANT
BANDAGE ESMARK 6X9 LF (GAUZE/BANDAGES/DRESSINGS) ×1 IMPLANT
BLADE SAG 18X100X1.27 (BLADE) ×3 IMPLANT
BLADE SAW SGTL 11.0X1.19X90.0M (BLADE) ×2 IMPLANT
BNDG CMPR 9X6 STRL LF SNTH (GAUZE/BANDAGES/DRESSINGS) ×1
BNDG ESMARK 6X9 LF (GAUZE/BANDAGES/DRESSINGS) ×2
BOWL SMART MIX CTS (DISPOSABLE) ×2 IMPLANT
CAPT RP KNEE ×1 IMPLANT
CEMENT HV SMART SET (Cement) ×4 IMPLANT
CLOTH BEACON ORANGE TIMEOUT ST (SAFETY) ×2 IMPLANT
CUFF TOURN SGL QUICK 34 (TOURNIQUET CUFF) ×2
CUFF TRNQT CYL 34X4X40X1 (TOURNIQUET CUFF) ×1 IMPLANT
DECANTER SPIKE VIAL GLASS SM (MISCELLANEOUS) ×2 IMPLANT
DRAPE EXTREMITY T 121X128X90 (DRAPE) ×2 IMPLANT
DRAPE POUCH INSTRU U-SHP 10X18 (DRAPES) ×2 IMPLANT
DRAPE U-SHAPE 47X51 STRL (DRAPES) ×2 IMPLANT
DRSG ADAPTIC 3X8 NADH LF (GAUZE/BANDAGES/DRESSINGS) ×2 IMPLANT
DRSG PAD ABDOMINAL 8X10 ST (GAUZE/BANDAGES/DRESSINGS) ×2 IMPLANT
DURAPREP 26ML APPLICATOR (WOUND CARE) ×2 IMPLANT
ELECT REM PT RETURN 9FT ADLT (ELECTROSURGICAL) ×2
ELECTRODE REM PT RTRN 9FT ADLT (ELECTROSURGICAL) ×1 IMPLANT
EVACUATOR 1/8 PVC DRAIN (DRAIN) ×2 IMPLANT
FACESHIELD LNG OPTICON STERILE (SAFETY) ×10 IMPLANT
GLOVE BIO SURGEON STRL SZ8 (GLOVE) ×2 IMPLANT
GLOVE BIOGEL PI IND STRL 8 (GLOVE) ×2 IMPLANT
GLOVE BIOGEL PI INDICATOR 8 (GLOVE) ×1
GLOVE SURG SS PI 6.5 STRL IVOR (GLOVE) ×2 IMPLANT
GOWN STRL NON-REIN LRG LVL3 (GOWN DISPOSABLE) ×3 IMPLANT
GOWN STRL REIN XL XLG (GOWN DISPOSABLE) ×2 IMPLANT
HANDPIECE INTERPULSE COAX TIP (DISPOSABLE) ×2
IMMOBILIZER KNEE 22 UNIV (SOFTGOODS) ×1 IMPLANT
KIT BASIN OR (CUSTOM PROCEDURE TRAY) ×2 IMPLANT
MANIFOLD NEPTUNE II (INSTRUMENTS) ×2 IMPLANT
NDL SAFETY ECLIPSE 18X1.5 (NEEDLE) ×2 IMPLANT
NEEDLE HYPO 18GX1.5 SHARP (NEEDLE) ×4
NS IRRIG 1000ML POUR BTL (IV SOLUTION) ×2 IMPLANT
PACK TOTAL JOINT (CUSTOM PROCEDURE TRAY) ×2 IMPLANT
PADDING CAST COTTON 6X4 STRL (CAST SUPPLIES) ×6 IMPLANT
POSITIONER SURGICAL ARM (MISCELLANEOUS) ×2 IMPLANT
SET HNDPC FAN SPRY TIP SCT (DISPOSABLE) ×1 IMPLANT
SPONGE GAUZE 4X4 12PLY (GAUZE/BANDAGES/DRESSINGS) ×2 IMPLANT
STRIP CLOSURE SKIN 1/2X4 (GAUZE/BANDAGES/DRESSINGS) ×4 IMPLANT
SUCTION FRAZIER 12FR DISP (SUCTIONS) ×2 IMPLANT
SUT MNCRL AB 4-0 PS2 18 (SUTURE) ×2 IMPLANT
SUT VIC AB 2-0 CT1 27 (SUTURE) ×6
SUT VIC AB 2-0 CT1 TAPERPNT 27 (SUTURE) ×3 IMPLANT
SUT VLOC 180 0 24IN GS25 (SUTURE) ×2 IMPLANT
SYR 20CC LL (SYRINGE) ×2 IMPLANT
SYR 50ML LL SCALE MARK (SYRINGE) ×2 IMPLANT
TOWEL OR 17X26 10 PK STRL BLUE (TOWEL DISPOSABLE) ×3 IMPLANT
TOWEL OR NON WOVEN STRL DISP B (DISPOSABLE) ×1 IMPLANT
TRAY FOLEY METER SIL LF 16FR (CATHETERS) ×1 IMPLANT
WATER STERILE IRR 1500ML POUR (IV SOLUTION) ×3 IMPLANT
WRAP KNEE MAXI GEL POST OP (GAUZE/BANDAGES/DRESSINGS) ×2 IMPLANT

## 2012-12-30 NOTE — H&P (View-Only) (Signed)
James Boone  DOB: [birth year] Married / Language: [place] / Race: White Male  Date of Admission: 12/30/2012  Chief Complaint:  Left Knee Pain  History of Present Illness The patient is a 52 year old male who comes in for a preoperative History and Physical. The patient is scheduled for a left total knee arthroplasty to be performed by Dr. Gus Rankin. Aluisio, MD at [facility] on 12/30/2012. The patient is a 52 year old male who presents for follow up of their knee. The patient is being followed for their left knee pain. Symptoms reported today include: pain, locking, catching and giving way. The patient feels that they are doing poorly (left knee) and report their pain level to be moderate. The following medication has been used for pain control: none. The patient has not gotten any relief of their symptoms with conservative measures or Cortisone injections. The patient indicates that they have questions or concerns today regarding their progress at this point. The patient was seen in referral from Dr. Darrelyn Hillock. The patient reports left knee symptoms including: pain which began year(s) ago without any known injury. Note for "Knee pain": The patient is ready to proceed with surgery. They have been treated conservatively in the past for the above stated problem and despite conservative measures, they continue to have progressive pain and severe functional limitations and dysfunction. They have failed non-operative management including home exercise, medications, and injections. It is felt that they would benefit from undergoing total joint replacement. Risks and benefits of the procedure have been discussed with the patient and they elect to proceed with surgery. There are no active contraindications to surgery such as ongoing infection or rapidly progressive neurological disease.  Problem List Osteoarthritis, Knee (715.96)  Allergies No Known Drug Allergies. 10/12/2011   Family  History Heart disease in male family member before age 75 Cerebrovascular Accident. father Depression. sister Heart Disease. father   Social History Exercise. Exercises daily; does running / walking Drug/Alcohol Rehab (Previously). no Drug/Alcohol Rehab (Currently). no Marital status. married Living situation. live with spouse Illicit drug use. no Current work status. working part time Children. 2 Alcohol use. current drinker; drinks beer and wine; only occasionally per week Pain Contract. no Tobacco use. never smoker Tobacco / smoke exposure. no Post-Surgical Plans. Home   Medication History Meloxicam (15MG  Tablet, 1 Oral daily, Taken starting 11/11/2012) Active. Adderall XR ( Oral) Specific dose unknown - Active. Vyvanse (70MG  Capsule, Oral) Active. Sertraline HCl (100MG  Tablet, Oral) Active. BuPROPion HCl ER (XL) (150MG  Tablet ER 24HR, Oral) Active. Metoprolol Tartrate (50MG  Tablet, Oral) Active. Atorvastatin Calcium (10MG  Tablet, Oral) Active. Amphetamine Salt Combo (25MG  Capsule ER 24HR, Oral) Active. Testosterone Cypionate ( Intramuscular) Specific dose unknown - Active. LamoTRIgine ER (200MG  Tablet ER 24HR, Oral) Active. Aspirin EC (81MG  Tablet DR, Oral) Active. Vitamin C ( Oral) Active. Super B Complex ( Oral) Active. Fish Oil + D3 (1200-1000MG -UNIT Capsule, Oral) Active. Glucosamine-Chondroitin DS ( Oral) Active. Multivitamin ( Oral) Active.   Past Surgical History Vasectomy. Date: 75. Arthroscopy of Knee. bilateral; Right - 2008, Left - 2010 Wisdom Teeth Extraction Abdominal Hernia Repair. Date: 2001.   Medical History Rheumatoid Arthritis Sleep Apnea. uses CPAP High blood pressure Hypercholesterolemia Depression Impaired Vision Hiatal Hernia Hemorrhoids Peripheral Edema   Review of Systems General:Not Present- Chills, Fever, Night Sweats, Fatigue, Weight Gain, Weight Loss and Memory Loss. Skin:Not Present- Hives,  Itching, Rash, Eczema and Lesions. HEENT:Not Present- Tinnitus, Headache, Double Vision, Visual Loss, Hearing Loss and Dentures. Respiratory:Not  Present- Shortness of breath with exertion, Shortness of breath at rest, Allergies, Coughing up blood and Chronic Cough. Cardiovascular:Not Present- Chest Pain, Racing/skipping heartbeats, Difficulty Breathing Lying Down, Murmur, Swelling and Palpitations. Gastrointestinal:Not Present- Bloody Stool, Heartburn, Abdominal Pain, Vomiting, Nausea, Constipation, Diarrhea, Difficulty Swallowing, Jaundice and Loss of appetitie. Male Genitourinary:Not Present- Urinary frequency, Blood in Urine, Weak urinary stream, Discharge, Flank Pain, Incontinence, Painful Urination, Urgency, Urinary Retention and Urinating at Night. Musculoskeletal:Present- Joint Swelling and Joint Pain. Not Present- Muscle Weakness, Muscle Pain, Back Pain, Morning Stiffness and Spasms. Neurological:Not Present- Tremor, Dizziness, Blackout spells, Paralysis, Difficulty with balance and Weakness. Psychiatric:Not Present- Insomnia.   Vitals Weight: 286 lb Height: 77 in Body Surface Area: 2.65 m Body Mass Index: 33.91 kg/m Pulse: 76 (Regular) Resp.: 16 (Unlabored) BP: 118/72 (Sitting, Right Arm, Standard)    Physical Exam The physical exam findings are as follows:   General Mental Status - Alert, cooperative and good historian. General Appearance- pleasant. Not in acute distress. Orientation- Oriented X3. Build & Nutrition- Well nourished and Well developed.   Head and Neck Head- normocephalic, atraumatic . Neck Global Assessment- supple. no bruit auscultated on the right and no bruit auscultated on the left.   Eye Vision- Wears corrective lenses. Pupil- Bilateral- Regular and Round. Motion- Bilateral- EOMI.   Chest and Lung Exam Auscultation: Breath sounds:- clear at anterior chest wall and - clear at posterior chest  wall. Adventitious sounds:- No Adventitious sounds.   Cardiovascular Auscultation:Rhythm- Regular rate and rhythm. Heart Sounds- S1 WNL and S2 WNL. Murmurs & Other Heart Sounds: Murmur 1:Location- Aortic Area. Timing- Early systolic. Grade- II/VI (faint). Character- Low pitched.   Abdomen Inspection:Contour- Generalized mild distention. Palpation/Percussion:Tenderness- Abdomen is non-tender to palpation. Rigidity (guarding)- Abdomen is soft. Auscultation:Auscultation of the abdomen reveals - Bowel sounds normal.   Male Genitourinary Not done, not pertinent to present illness  Musculoskeletal  Examining him, he lacks about 5-8 degrees of extension. He flexes just to about 95 degrees bilaterally. He has no collateral or cruciate ligament instability. The patellae track well in the midline. He has some venostasis changes in his legs, left greater than the right, but that is chronic. His popliteal space is fine. There is no calf tenderness.  Standing, he has a significant genu varus bilaterally  RADIOGRAPHS: X-rays of the knee show bone on bone in the medial compartments with a genu varus.  Assessment & Plan Osteoarthritis, Knee (715.96) Impression: Left Knee  Note: Plan is for a Left Total Knee Replacement by Dr. Lequita Halt.  Plan is to go home.  The patient does not have any contraindications and will recieve TXA (tranexamic acid) prior to surgery.  Signed electronically by Lauraine Rinne, III PA-C

## 2012-12-30 NOTE — Evaluation (Signed)
Physical Therapy Evaluation Patient Details Name: James Boone MRN: [id number] DOB: December 02, 1960 Today's Date: 12/30/2012 Time: 4098-1191 PT Time Calculation (min): 31 min  PT Assessment / Plan / Recommendation History of Present Illness  LTKA on 12/30/12  Clinical Impression  Pt has been a lot of pain per RN. Ambulation reportedly decreased pain. Pt plans DC home. Will benefit from PT while in acute care. Pt will need a rolling walker.    PT Assessment  Patient needs continued PT services    Follow Up Recommendations  Home health PT    Does the patient have the potential to tolerate intense rehabilitation      Barriers to Discharge        Equipment Recommendations  Rolling walker with 5" wheels    Recommendations for Other Services     Frequency 7X/week    Precautions / Restrictions Precautions Precautions: Knee Required Braces or Orthoses: Knee Immobilizer - Left Knee Immobilizer - Left: Discontinue once straight leg raise with < 10 degree lag Restrictions Weight Bearing Restrictions: No   Pertinent Vitals/Pain 7 L knee-- had max amount Meds      Mobility  Bed Mobility Bed Mobility: Not assessed Details for Bed Mobility Assistance: pt sitting on EOB  when PT arrived. Transfers Transfers: Sit to Stand;Stand to Sit Sit to Stand: 4: Min assist;From bed;From elevated surface;With upper extremity assist Stand to Sit: 4: Min assist;With upper extremity assist;To chair/3-in-1 Details for Transfer Assistance: cues for  Hand placement and LLE position. Ambulation/Gait Ambulation/Gait Assistance: 4: Min assist Ambulation Distance (Feet): 50 Feet Assistive device: Rolling walker Ambulation/Gait Assistance Details: cues for sequence. Gait Pattern: Step-to pattern    Exercises Total Joint Exercises Quad Sets: AROM;Left;10 reps;Supine Straight Leg Raises: AROM;Left;5 reps;Supine   PT Diagnosis: Difficulty walking;Acute pain  PT Problem List: Decreased strength;Decreased  range of motion;Decreased activity tolerance;Decreased mobility;Decreased safety awareness;Decreased knowledge of use of DME;Decreased knowledge of precautions;Pain PT Treatment Interventions: DME instruction;Gait training;Stair training;Functional mobility training;Therapeutic activities;Therapeutic exercise;Patient/family education     PT Goals(Current goals can be found in the care plan section) Acute Rehab PT Goals Patient Stated Goal: I want to be free of pain PT Goal Formulation: With patient/family Time For Goal Achievement: 01/06/13 Potential to Achieve Goals: Good  Visit Information  Last PT Received On: 12/30/12 Assistance Needed: +1 History of Present Illness: LTKA on 12/30/12       Prior Functioning  Home Living Family/patient expects to be discharged to:: Private residence Living Arrangements: Spouse/significant other;Children Available Help at Discharge: Family Type of Home: House Home Access: Stairs to enter Secretary/administrator of Steps: 3 Entrance Stairs-Rails: Left Home Layout: Multi-level;Able to live on main level with bedroom/bathroom Home Equipment: Crutches Prior Function Level of Independence: Independent Communication Communication: No difficulties    Cognition  Cognition Arousal/Alertness: Awake/alert Behavior During Therapy: WFL for tasks assessed/performed Overall Cognitive Status: Within Functional Limits for tasks assessed    Extremity/Trunk Assessment Upper Extremity Assessment Upper Extremity Assessment: Overall WFL for tasks assessed Lower Extremity Assessment Lower Extremity Assessment: LLE deficits/detail LLE Deficits / Details: able to perform SLR Cervical / Trunk Assessment Cervical / Trunk Assessment: Normal   Balance    End of Session PT - End of Session Equipment Utilized During Treatment: Left knee immobilizer Activity Tolerance: Patient tolerated treatment well Patient left: in chair;with call bell/phone within reach;with  family/visitor present Nurse Communication: Mobility status  GP     [name] 12/30/2012, 4:12 PM  Blanchard Kelch PT [phone]

## 2012-12-30 NOTE — Op Note (Signed)
Pre-operative diagnosis- Osteoarthritis  Left knee(s)  Post-operative diagnosis- Osteoarthritis Left knee(s)  Procedure-  Left  Total Knee Arthroplasty  Surgeon- Gus Rankin. Sudie Bandel, MD  Assistant- Dimitri Ped, PA-C   Anesthesia-  Spinal EBL-* No blood loss amount entered *  Drains Hemovac  Tourniquet time-  Total Tourniquet Time Documented: Thigh (Left) - 57 minutes Total: Thigh (Left) - 57 minutes    Complications- None  Condition-PACU - hemodynamically stable.   Brief Clinical Note   James Boone is a 52 y.o. year old male with end stage OA of his left knee with progressively worsening pain and dysfunction. He has constant pain, with activity and at rest and significant functional deficits with difficulties even with ADLs. He has had extensive non-op management including analgesics, injections of cortisone, and home exercise program, but remains in significant pain with significant dysfunction. Radiographs show bone on bone arthritis medial and patellofemoral. He presents now for left Total Knee Arthroplasty.     Procedure in detail---   The patient is brought into the operating room and positioned supine on the operating table. After successful administration of  Spinal,   a tourniquet is placed high on the  Left thigh(s) and the lower extremity is prepped and draped in the usual sterile fashion. Time out is performed by the operating team and then the  Left lower extremity is wrapped in Esmarch, knee flexed and the tourniquet inflated to 300 mmHg.       A midline incision is made with a ten blade through the subcutaneous tissue to the level of the extensor mechanism. A fresh blade is used to make a medial parapatellar arthrotomy. Soft tissue over the proximal medial tibia is subperiosteally elevated to the joint line with a knife and into the semimembranosus bursa with a Cobb elevator. Soft tissue over the proximal lateral tibia is elevated with attention being paid to avoiding the  patellar tendon on the tibial tubercle. The patella is everted, knee flexed 90 degrees and the ACL and PCL are removed. Findings are bone on bone medial and patellofemoral with massive global osteophytes.        The drill is used to create a starting hole in the distal femur and the canal is thoroughly irrigated with sterile saline to remove the fatty contents. The 5 degree Left  valgus alignment guide is placed into the femoral canal and the distal femoral cutting block is pinned to remove 10 mm off the distal femur. Resection is made with an oscillating saw.      The tibia is subluxed forward and the menisci are removed. The extramedullary alignment guide is placed referencing proximally at the medial aspect of the tibial tubercle and distally along the second metatarsal axis and tibial crest. The block is pinned to remove 2mm off the more deficient medial  side. Resection is made with an oscillating saw. Size 5is the most appropriate size for the tibia and the proximal tibia is prepared with the modular drill and keel punch for that size.      The femoral sizing guide is placed and size 6 is most appropriate. Rotation is marked off the epicondylar axis and confirmed by creating a rectangular flexion gap at 90 degrees. The size 6 cutting block is pinned in this rotation and the anterior, posterior and chamfer cuts are made with the oscillating saw. The intercondylar block is then placed and that cut is made.      Trial size 5 tibial component, trial size 6  posterior stabilized femur and a 15  mm posterior stabilized rotating platform insert trial is placed. Full extension is achieved with excellent varus/valgus and anterior/posterior balance throughout full range of motion. The patella is everted and thickness measured to be 27  mm. Free hand resection is taken to 15 mm, a 41 template is placed, lug holes are drilled, trial patella is placed, and it tracks normally. Osteophytes are removed off the posterior  femur with the trial in place. All trials are removed and the cut bone surfaces prepared with pulsatile lavage. Cement is mixed and once ready for implantation, the size 5 tibial implant, size  6 posterior stabilized femoral component, and the size 41 patella are cemented in place and the patella is held with the clamp. The trial insert is placed and the knee held in full extension. The Exparel (20 ml mixed with 30 ml saline) and .25% Bupivicaine, are injected into the extensor mechanism, posterior capsule, medial and lateral gutters and subcutaneous tissues.  All extruded cement is removed and once the cement is hard the permanent 15 mm posterior stabilized rotating platform insert is placed into the tibial tray.      The wound is copiously irrigated with saline solution and the extensor mechanism closed over a hemovac drain with #1 PDS suture. The tourniquet is released for a total tourniquet time of 57  minutes. Flexion against gravity is 140 degrees and the patella tracks normally. Subcutaneous tissue is closed with 2.0 vicryl and subcuticular with running 4.0 Monocryl. The incision is cleaned and dried and steri-strips and a bulky sterile dressing are applied. The limb is placed into a knee immobilizer and the patient is awakened and transported to recovery in stable condition.      Please note that a surgical assistant was a medical necessity for this procedure in order to perform it in a safe and expeditious manner. Surgical assistant was necessary to retract the ligaments and vital neurovascular structures to prevent injury to them and also necessary for proper positioning of the limb to allow for anatomic placement of the prosthesis.   Gus Rankin Raquan Iannone, MD    12/30/2012, 8:40 AM

## 2012-12-30 NOTE — Anesthesia Procedure Notes (Signed)
Spinal Patient location during procedure: OR Staffing Performed by: anesthesiologist  Preanesthetic Checklist Completed: patient identified, site marked, surgical consent, pre-op evaluation, timeout performed, IV checked, risks and benefits discussed and monitors and equipment checked Spinal Block Patient position: sitting Prep: Betadine Patient monitoring: heart rate, continuous pulse ox and blood pressure Injection technique: single-shot Needle Needle type: Sprotte  Needle gauge: 22 G Needle length: 9 cm Additional Notes Expiration date of kit checked and confirmed. Patient tolerated procedure well, without complications.     

## 2012-12-30 NOTE — Interval H&P Note (Signed)
History and Physical Interval Note:  12/30/2012 6:52 AM  James Boone  has presented today for surgery, with the diagnosis of osteoarthritis left knee  The various methods of treatment have been discussed with the patient and family. After consideration of risks, benefits and other options for treatment, the patient has consented to  Procedure(s): LEFT TOTAL KNEE ARTHROPLASTY (Left) as a surgical intervention .  The patient's history has been reviewed, patient examined, no change in status, stable for surgery.  I have reviewed the patient's chart and labs.  Questions were answered to the patient's satisfaction.     Loanne Drilling

## 2012-12-30 NOTE — Anesthesia Preprocedure Evaluation (Signed)
Anesthesia Evaluation  Patient identified by MRN, date of birth, ID band Patient awake    Reviewed: Allergy & Precautions, H&P , NPO status , Patient's Chart, lab work & pertinent test results  Airway Mallampati: III TM Distance: <3 FB Neck ROM: Full    Dental no notable dental hx.    Pulmonary sleep apnea ,  breath sounds clear to auscultation  + decreased breath sounds      Cardiovascular hypertension, Pt. on medications Rhythm:Regular Rate:Normal     Neuro/Psych negative neurological ROS  negative psych ROS   GI/Hepatic negative GI ROS, Neg liver ROS,   Endo/Other  Morbid obesity  Renal/GU negative Renal ROS  negative genitourinary   Musculoskeletal negative musculoskeletal ROS (+)   Abdominal   Peds negative pediatric ROS (+)  Hematology negative hematology ROS (+)   Anesthesia Other Findings   Reproductive/Obstetrics negative OB ROS                           Anesthesia Physical Anesthesia Plan  ASA: III  Anesthesia Plan: Spinal   Post-op Pain Management:    Induction:   Airway Management Planned: Nasal Cannula  Additional Equipment:   Intra-op Plan:   Post-operative Plan:   Informed Consent: I have reviewed the patients History and Physical, chart, labs and discussed the procedure including the risks, benefits and alternatives for the proposed anesthesia with the patient or authorized representative who has indicated his/her understanding and acceptance.     Plan Discussed with: CRNA and Surgeon  Anesthesia Plan Comments:         Anesthesia Quick Evaluation

## 2012-12-30 NOTE — Transfer of Care (Signed)
Immediate Anesthesia Transfer of Care Note  Patient: James Boone  Procedure(s) Performed: Procedure(s): LEFT TOTAL KNEE ARTHROPLASTY (Left)  Patient Location: PACU  Anesthesia Type:Regional and Spinal  Level of Consciousness: awake, alert , sedated and patient cooperative  Airway & Oxygen Therapy: Patient Spontanous Breathing and Patient connected to face mask oxygen  Post-op Assessment: Report given to PACU RN and Post -op Vital signs reviewed and stable  Post vital signs: Reviewed and stable  Complications: No apparent anesthesia complications

## 2012-12-30 NOTE — Progress Notes (Signed)
Placed patient on cpap of 13 with 3lpm o2 bleed in.  Patient has his own mask.  HR 74, Sat 100.  Patient tolerating well.

## 2012-12-31 ENCOUNTER — Encounter (HOSPITAL_COMMUNITY): Payer: Self-pay | Admitting: Orthopedic Surgery

## 2012-12-31 DIAGNOSIS — E871 Hypo-osmolality and hyponatremia: Secondary | ICD-10-CM | POA: Diagnosis not present

## 2012-12-31 LAB — BASIC METABOLIC PANEL
CO2: 31 mEq/L (ref 19–32)
Chloride: 99 mEq/L (ref 96–112)
Creatinine, Ser: 0.96 mg/dL (ref 0.50–1.35)
GFR calc Af Amer: >90 mL/min (ref >90)
Potassium: 4.8 mEq/L (ref 3.5–5.1)

## 2012-12-31 LAB — CBC
HCT: 41.1 % (ref 39.0–52.0)
MCV: 85.1 fL (ref 78.0–100.0)
Platelets: 192 10*3/uL (ref 150–400)
RBC: 4.83 MIL/uL (ref 4.22–5.81)
WBC: 8.7 10*3/uL (ref 4.0–10.5)

## 2012-12-31 NOTE — Care Management Note (Signed)
  Page 2 of 2   12/31/2012     3:33:15 PM   CARE MANAGEMENT NOTE 12/31/2012  Patient:  James Boone, James Boone   Account Number:  [id number]  Date Initiated:  12/31/2012  Documentation initiated by:  Colleen Can  Subjective/Objective Assessment:   DX osteoarthritis left knee; total knee replacemnt     Action/Plan:   CM spoke with patient and spouse. Plans are for patient to return to his home in [place] where spouse will be caregiver. He will need RW and 3n1.  [facility] will provide [facility] services.   Anticipated DC Date:  01/02/2013   Anticipated DC Plan:  HOME W HOME HEALTH SERVICES      DC Planning Services  CM consult      [facility] Choice  HOME HEALTH  DURABLE MEDICAL EQUIPMENT   Choice offered to / List presented to:  C-1 Patient        HH arranged  HH-2 PT      [facility] agency  [facility]   Status of service:  In process, will continue to follow Medicare Important Message given?   (If response is "NO", the following Medicare IM given date fields will be blank) Date Medicare IM given:   Date Additional Medicare IM given:    Discharge Disposition:    Per UR Regulation:  Reviewed for med. necessity/level of care/duration of stay  If discussed at Long Length of Stay Meetings, dates discussed:    Comments:  12/31/2012 Dory Peru RN CCM [phone] [facility] rep notified of DME needs.

## 2012-12-31 NOTE — Progress Notes (Signed)
Physical Therapy Treatment Patient Details Name: LIMUEL NIEBLAS MRN: [id number] DOB: August 01, 1960 Today's Date: 12/31/2012 Time: 8295-6213 PT Time Calculation (min): 23 min  PT Assessment / Plan / Recommendation  History of Present Illness LTKA on 12/30/12   PT Comments   Pt tolerating increased ambulation, amb. Without KI. Pt with less pain today.Plan DC tomorrow AFTER pt  Follow Up Recommendations  Home health PT     Does the patient have the potential to tolerate intense rehabilitation     Barriers to Discharge        Equipment Recommendations  Rolling walker with 5" wheels    Recommendations for Other Services    Frequency 7X/week   Progress towards PT Goals Progress towards PT goals: Progressing toward goals  Plan Current plan remains appropriate    Precautions / Restrictions Precautions Precautions: Knee Required Braces or Orthoses: Knee Immobilizer - Left Knee Immobilizer - Left: Discontinue once straight leg raise with < 10 degree lag   Pertinent Vitals/Pain < 4 L knee    Mobility  Bed Mobility Bed Mobility: Supine to Sit Supine to Sit: 4: Min guard Details for Bed Mobility Assistance: support of LLE  Transfers Transfers: Sit to Stand;Stand to Sit Sit to Stand: From elevated surface;With upper extremity assist;From bed;4: Min guard Stand to Sit: 4: Min guard;With upper extremity assist;To chair/3-in-1 Details for Transfer Assistance: cues for  Hand placement and LLE position. Ambulation/Gait Ambulation/Gait Assistance: 4: Min assist Ambulation Distance (Feet): 100 Feet Assistive device: Rolling walker Ambulation/Gait Assistance Details: cues for RW placement in from=nt, step length on L. Position inside RW- not get too close to fronnt. Gait Pattern: Step-through pattern    Exercises Total Joint Exercises Quad Sets: AROM;Left;10 reps;Supine Short Arc Quad: AAROM;Left;10 reps;Supine Heel Slides: AAROM;Left;10 reps;Supine Hip ABduction/ADduction: AROM;Left;10  reps;Supine Straight Leg Raises: AAROM;Left;10 reps;Supine Goniometric ROM: 10-50 l knee flexion.   PT Diagnosis:    PT Problem List:   PT Treatment Interventions:     PT Goals (current goals can now be found in the care plan section)    Visit Information  Last PT Received On: 12/31/12 Assistance Needed: +1 History of Present Illness: LTKA on 12/30/12    Subjective Data      Cognition  Cognition Arousal/Alertness: Awake/alert    Balance     End of Session PT - End of Session Activity Tolerance: Patient tolerated treatment well Patient left: in chair;with call bell/phone within reach;with family/visitor present Nurse Communication: Mobility status CPM Left Knee CPM Left Knee: Off   GP     [name] 12/31/2012, 5:55 PM

## 2012-12-31 NOTE — Evaluation (Signed)
Occupational Therapy Evaluation Patient Details Name: James Boone MRN: [id number] DOB: [birth year] Today's Date: 12/31/2012 Time: 4098-1191 OT Time Calculation (min): 15 min  OT Assessment / Plan / Recommendation History of present illness LTKA on 12/30/12   Clinical Impression   Pt educated in use of AE, 3 in1 and verbally in shower transfer.  Pt will use a shower seat he already has and wife will supervise shower transfer initially.  Pt nearly able to reach his foot for bathing and dressing and is able to rely on his family for assist.  No further OT needs.    OT Assessment  Patient does not need any further OT services    Follow Up Recommendations  No OT follow up;Supervision/Assistance - 24 hour    Barriers to Discharge      Equipment Recommendations  3 in 1 bedside comode    Recommendations for Other Services    Frequency       Precautions / Restrictions Precautions Precautions: Knee Required Braces or Orthoses: Knee Immobilizer - Left Knee Immobilizer - Left: Discontinue once straight leg raise with < 10 degree lag (able to perform SLR) Restrictions Weight Bearing Restrictions: No LLE Weight Bearing: Weight bearing as tolerated   Pertinent Vitals/Pain 4-5/10, premedicated, icing, repositioned    ADL  Eating/Feeding: Independent Where Assessed - Eating/Feeding: Bed level Grooming: Wash/dry hands;Supervision/safety Where Assessed - Grooming: Supported standing Upper Body Bathing: Supervision/safety Where Assessed - Upper Body Bathing: Unsupported sitting Lower Body Bathing: Minimal assistance Where Assessed - Lower Body Bathing: Unsupported sitting Upper Body Dressing: Set up Where Assessed - Upper Body Dressing: Unsupported sitting Lower Body Dressing: Minimal assistance Where Assessed - Lower Body Dressing: Unsupported sitting Toilet Transfer: Min guard Equipment Used: Long-handled shoe horn;Long-handled sponge;Reacher;Rolling walker;Sock aid;Gait belt ADL  Comments: Educated pt in use of AE for LB ADL, shower transfer leading with non operative leg, use of 3 in 1 over toilet.    OT Diagnosis:    OT Problem List:   OT Treatment Interventions:     OT Goals(Current goals can be found in the care plan section) Acute Rehab OT Goals Patient Stated Goal: I want to be free of pain  Visit Information  Last OT Received On: 12/31/12 Assistance Needed: +1 History of Present Illness: LTKA on 12/30/12       Prior Functioning     Home Living Family/patient expects to be discharged to:: Private residence Living Arrangements: Spouse/significant other;Children Available Help at Discharge: Family;Available 24 hours/day Type of Home: House Home Access: Stairs to enter [employer] of Steps: 3 Entrance Stairs-Rails: Left Home Layout: Multi-level;Able to live on main level with bedroom/bathroom Home Equipment: Crutches;Shower seat Prior Function Level of Independence: Independent Communication Communication: No difficulties Dominant Hand: Right         Vision/Perception Vision - History Patient Visual Report: No change from baseline   Cognition  Cognition Arousal/Alertness: Awake/alert Behavior During Therapy: WFL for tasks assessed/performed Overall Cognitive Status: Within Functional Limits for tasks assessed    Extremity/Trunk Assessment Upper Extremity Assessment Upper Extremity Assessment: Overall WFL for tasks assessed Lower Extremity Assessment Lower Extremity Assessment: Defer to PT evaluation Cervical / Trunk Assessment Cervical / Trunk Assessment: Normal     Mobility Bed Mobility Bed Mobility: Not assessed Details for Bed Mobility Assistance: Pt at EOB. Transfers Sit to Stand: From elevated surface;With upper extremity assist;From bed;4: Min assist Stand to Sit: 4: Min guard;With upper extremity assist;To bed     Exercise     Balance  End of Session OT - End of Session Activity Tolerance: Patient  tolerated treatment well Patient left: in bed;with call bell/phone within reach;with family/visitor present CPM Left Knee CPM Left Knee: Off  GO     Evern Bio 12/31/2012, 9:37 AM [phone]

## 2012-12-31 NOTE — Progress Notes (Signed)
Physical Therapy Treatment Patient Details Name: James Boone MRN: [id number] DOB: [birth year] Today's Date: 12/31/2012 Time: 4098-1191 PT Time Calculation (min): 18 min  PT Assessment / Plan / Recommendation  History of Present Illness LTKA on 12/30/12   PT Comments   Has practiced steps with wife present. Progressing well.   Follow Up Recommendations  Home health PT     Does the patient have the potential to tolerate intense rehabilitation     Barriers to Discharge        Equipment Recommendations  Rolling walker with 5" wheels    Recommendations for Other Services    Frequency 7X/week   Progress towards PT Goals Progress towards PT goals: Progressing toward goals  Plan Current plan remains appropriate    Precautions / Restrictions Precautions Precautions: Knee Required Braces or Orthoses: Knee Immobilizer - Left Knee Immobilizer - Left: Discontinue once straight leg raise with < 10 degree lag   Pertinent Vitals/Pain < 4 L knee    Mobility  Bed Mobility Bed Mobility: Sit to Supine Supine to Sit: 4: Min guard Sit to Supine: 4: Min guard Details for Bed Mobility Assistance: support of LLE  Transfers Transfers: Sit to Stand;Stand to Sit Sit to Stand: From elevated surface;With upper extremity assist;5: Supervision;From chair/3-in-1 Stand to Sit: With upper extremity assist;5: Supervision;To bed Details for Transfer Assistance: cues for  Hand placement and LLE position. Ambulation/Gait Ambulation/Gait Assistance: 4: Min guard Ambulation Distance (Feet): 100 Feet Assistive device: Rolling walker Ambulation/Gait Assistance Details: cues for sequence and position inside RW. decrease step length L. Gait Pattern: Step-through pattern Stairs: Yes Stairs Assistance: 4: Min assist Stairs Assistance Details (indicate cue type and reason): wife present for instruction. Stair Management Technique: One rail Left;Step to pattern;Forwards;With crutches Number of Stairs: 2     Exercises    PT Diagnosis:    PT Problem List:   PT Treatment Interventions:     PT Goals (current goals can now be found in the care plan section)    Visit Information  Last PT Received On: 12/31/12 Assistance Needed: +1 History of Present Illness: LTKA on 12/30/12    Subjective Data      Cognition  Cognition Arousal/Alertness: Awake/alert    Balance     End of Session PT - End of Session Equipment Utilized During Treatment: Left knee immobilizer Activity Tolerance: Patient tolerated treatment well Patient left: with call bell/phone within reach;with family/visitor present;in bed Nurse Communication: Mobility status CPM Left Knee CPM Left Knee: Off   GP     James Boone 12/31/2012, 5:59 PM

## 2012-12-31 NOTE — Progress Notes (Signed)
   Subjective: 1 Day Post-Op Procedure(s) (LRB): LEFT TOTAL KNEE ARTHROPLASTY (Left) Patient reports pain as mild.   Patient seen in rounds with Dr. Lequita Halt. Patient is well, and has had no acute complaints or problems We will start therapy today.  Plan is to go Home after hospital stay.  Objective: Vital signs in last 24 hours: Temp:  [97.3 F (36.3 C)-98.3 F (36.8 C)] 98.1 F (36.7 C) (08/12 0535) Pulse Rate:  [54-69] 68 (08/12 0535) Resp:  [9-20] 16 (08/12 0725) BP: (103-155)/(51-84) 155/84 mmHg (08/12 0535) SpO2:  [99 %-100 %] 100 % (08/12 0535) Weight:  [132.36 kg (291 lb 12.8 oz)] 132.36 kg (291 lb 12.8 oz) (08/11 1200)  Intake/Output from previous day:  Intake/Output Summary (Last 24 hours) at 12/31/12 0821 Last data filed at 12/31/12 0535  Gross per 24 hour  Intake   4420 ml  Output   3195 ml  Net   1225 ml    Intake/Output this shift: UOP 890 since MN +3225  Labs:  Recent Labs  12/31/12 0415  HGB 13.6    Recent Labs  12/31/12 0415  WBC 8.7  RBC 4.83  HCT 41.1  PLT 192    Recent Labs  12/31/12 0415  NA 133*  K 4.8  CL 99  CO2 31  BUN 16  CREATININE 0.96  GLUCOSE 151*  CALCIUM 9.0   No results found for this basename: LABPT, INR,  in the last 72 hours  EXAM General - Patient is Alert, Appropriate and Oriented Extremity - Neurovascular intact Sensation intact distally Dorsiflexion/Plantar flexion intact Dressing - dressing C/D/I Motor Function - intact, moving foot and toes well on exam.  Hemovac pulled without difficulty.  Past Medical History  Diagnosis Date  . Hyperlipidemia   . Depression   . Hypertension   . Hypogonadism male   . ADD (attention deficit disorder)   . Abnormal liver function test   . Allergic rhinitis   . Hyperglycemia   . Shortness of breath     with exertion   . Sleep apnea 2005    dx in 2005   . Arthritis     Assessment/Plan: 1 Day Post-Op Procedure(s) (LRB): LEFT TOTAL KNEE ARTHROPLASTY  (Left) Principal Problem:   OA (osteoarthritis) of knee Active Problems:   HYPERLIPIDEMIA - resumed home med   HYPERTENSION - resumed home med   SLEEP APNEA - restarted CPAP   Hyponatremia  Estimated body mass index is 35.53 kg/(m^2) as calculated from the following:   Height as of this encounter: 6\' 4"  (1.93 m).   Weight as of this encounter: 132.36 kg (291 lb 12.8 oz). Advance diet Up with therapy Plan for discharge tomorrow Discharge home with home health  DVT Prophylaxis - Xarelto Weight-Bearing as tolerated to left leg No vaccines. D/C O2 and Pulse OX and try on Room Air  PERKINS, ALEXZANDREW 12/31/2012, 8:21 AM

## 2013-01-01 LAB — BASIC METABOLIC PANEL
CO2: 27 mEq/L (ref 19–32)
Calcium: 9.2 mg/dL (ref 8.4–10.5)
Creatinine, Ser: 0.92 mg/dL (ref 0.50–1.35)
Glucose, Bld: 111 mg/dL — ABNORMAL HIGH (ref 70–99)

## 2013-01-01 LAB — CBC
Hemoglobin: 12.7 g/dL — ABNORMAL LOW (ref 13.0–17.0)
MCH: 28.2 pg (ref 26.0–34.0)
MCV: 84.5 fL (ref 78.0–100.0)
RBC: 4.51 MIL/uL (ref 4.22–5.81)

## 2013-01-01 MED ORDER — RIVAROXABAN 10 MG PO TABS: 10.0000 mg | ORAL_TABLET | Freq: Every day | ORAL | Status: DC

## 2013-01-01 MED ORDER — TRAMADOL HCL 50 MG PO TABS: 50.0000 mg | ORAL_TABLET | Freq: Four times a day (QID) | ORAL | Status: DC | PRN

## 2013-01-01 MED ORDER — OXYCODONE HCL 5 MG PO TABS: 5.0000 mg | ORAL_TABLET | ORAL | Status: DC | PRN

## 2013-01-01 MED ORDER — METHOCARBAMOL 500 MG PO TABS: 500.0000 mg | ORAL_TABLET | Freq: Four times a day (QID) | ORAL | Status: DC | PRN

## 2013-01-01 NOTE — Progress Notes (Signed)
Discharge summary sent to payer through [facility]  

## 2013-01-01 NOTE — Progress Notes (Signed)
   Subjective: 2 Days Post-Op Procedure(s) (LRB): LEFT TOTAL KNEE ARTHROPLASTY (Left) Patient reports pain as mild.   Patient seen in rounds for Dr. Lequita Halt. Patient is well, and has had no acute complaints or problems Patient is ready to go home  Objective: Vital signs in last 24 hours: Temp:  [98.1 F (36.7 C)-98.3 F (36.8 C)] 98.1 F (36.7 C) (08/13 0608) Pulse Rate:  [72-94] 94 (08/13 0608) Resp:  [16-18] 18 (08/13 0758) BP: (133-137)/(56-85) 133/85 mmHg (08/13 0608) SpO2:  [98 %-100 %] 98 % (08/13 0608)  Intake/Output from previous day:  Intake/Output Summary (Last 24 hours) at 01/01/13 1054 Last data filed at 01/01/13 0900  Gross per 24 hour  Intake 1114.67 ml  Output      4 ml  Net 1110.67 ml    Intake/Output this shift: Total I/O In: 240 [P.O.:240] Out: 2 [Urine:2]  Labs:  Recent Labs  12/31/12 0415 01/01/13 0354  HGB 13.6 12.7*    Recent Labs  12/31/12 0415 01/01/13 0354  WBC 8.7 11.2*  RBC 4.83 4.51  HCT 41.1 38.1*  PLT 192 203    Recent Labs  12/31/12 0415 01/01/13 0354  NA 133* 137  K 4.8 4.2  CL 99 103  CO2 31 27  BUN 16 14  CREATININE 0.96 0.92  GLUCOSE 151* 111*  CALCIUM 9.0 9.2   No results found for this basename: LABPT, INR,  in the last 72 hours  EXAM: General - Patient is Alert, Appropriate and Oriented Extremity - Neurovascular intact Sensation intact distally Dorsiflexion/Plantar flexion intact No cellulitis present Incision - clean, dry, no drainage, healing Motor Function - intact, moving foot and toes well on exam.   Assessment/Plan: 2 Days Post-Op Procedure(s) (LRB): LEFT TOTAL KNEE ARTHROPLASTY (Left) Procedure(s) (LRB): LEFT TOTAL KNEE ARTHROPLASTY (Left) Past Medical History  Diagnosis Date  . Hyperlipidemia   . Depression   . Hypertension   . Hypogonadism male   . ADD (attention deficit disorder)   . Abnormal liver function test   . Allergic rhinitis   . Hyperglycemia   . Shortness of breath     with exertion   . Sleep apnea 2005    dx in 2005   . Arthritis    Principal Problem:   OA (osteoarthritis) of knee Active Problems:   HYPERLIPIDEMIA   HYPERTENSION   SLEEP APNEA   Hyponatremia  Estimated body mass index is 35.53 kg/(m^2) as calculated from the following:   Height as of this encounter: 6\' 4"  (1.93 m).   Weight as of this encounter: 132.36 kg (291 lb 12.8 oz). Up with therapy Discharge home with home health Diet - Cardiac diet Follow up - in 2 weeks Activity - WBAT Disposition - Home Condition Upon Discharge - Good D/C Meds - See DC Summary DVT Prophylaxis - Xarelto  Mat Stuard 01/01/2013, 10:54 AM

## 2013-01-01 NOTE — Progress Notes (Signed)
Physical Therapy Treatment Patient Details Name: James Boone MRN: [id number] DOB: 1960-09-09 Today's Date: 01/01/2013 Time: 4132-4401 PT Time Calculation (min): 36 min  PT Assessment / Plan / Recommendation  History of Present Illness     PT Comments   PROGRESSING WELL. Dc TODAY  Follow Up Recommendations  Home health PT     Does the patient have the potential to tolerate intense rehabilitation     Barriers to Discharge        Equipment Recommendations       Recommendations for Other Services    Frequency     Progress towards PT Goals Progress towards PT goals: Progressing toward goals  Plan Current plan remains appropriate    Precautions / Restrictions     Pertinent Vitals/Pain Min pain    Mobility  Bed Mobility Sit to Supine: 6: Modified independent (Device/Increase time) Transfers Sit to Stand: From chair/3-in-1;6: Modified independent (Device/Increase time) Stand to Sit: To bed;6: Modified independent (Device/Increase time) Details for Transfer Assistance: cues for  Hand placement and LLE position. Ambulation/Gait Ambulation/Gait Assistance: 5: Supervision Ambulation Distance (Feet): 200 Feet Assistive device: Rolling walker Gait Pattern: Step-through pattern    Exercises Total Joint Exercises Quad Sets: AROM;Left;10 reps;Supine Short Arc Quad: Left;10 reps;Supine;AROM Heel Slides: Left;10 reps;Supine;AROM Hip ABduction/ADduction: AROM;Left;10 reps;Supine Straight Leg Raises: Left;10 reps;Supine;AROM Goniometric ROM: 10-70 L knee   PT Diagnosis:    PT Problem List:   PT Treatment Interventions:     PT Goals (current goals can now be found in the care plan section)    Visit Information  Last PT Received On: 01/01/13 Assistance Needed: +1    Subjective Data      Cognition       Balance     End of Session PT - End of Session Activity Tolerance: Patient tolerated treatment well Patient left: with call bell/phone within reach;with  family/visitor present;in bed Nurse Communication: Mobility status   GP     [name] 01/01/2013, 2:16 PM

## 2013-01-01 NOTE — Progress Notes (Signed)
Pt to d/c home. With [place]. DME delivered to room before d/c. AVS reviewed and "My Chart" discussed with pt. Pt capable of verbalizing medications and follow-up appointments. Remains hemodynamically stable. No signs and symptoms of distress. Educated pt to return to ER in the case of SOB, dizziness, or chest pain.

## 2013-01-01 NOTE — Discharge Summary (Signed)
Physician Discharge Summary   Patient ID: James Boone MRN: [id number] DOB/AGE: 11-15-1960 52 y.o.  Admit date: 12/30/2012 Discharge date: 01/01/2013  Primary Diagnosis:  Osteoarthritis Left knee  Admission Diagnoses:  Past Medical History  Diagnosis Date  . Hyperlipidemia   . Depression   . Hypertension   . Hypogonadism male   . ADD (attention deficit disorder)   . Abnormal liver function test   . Allergic rhinitis   . Hyperglycemia   . Shortness of breath     with exertion   . Sleep apnea 2005    dx in 2005   . Arthritis    Discharge Diagnoses:   Principal Problem:   OA (osteoarthritis) of knee Active Problems:   HYPERLIPIDEMIA   HYPERTENSION   SLEEP APNEA   Hyponatremia  Estimated body mass index is 35.53 kg/(m^2) as calculated from the following:   Height as of this encounter: 6\' 4"  (1.93 m).   Weight as of this encounter: 132.36 kg (291 lb 12.8 oz).  Procedure:  Procedure(s) (LRB): LEFT TOTAL KNEE ARTHROPLASTY (Left)   Consults: None  HPI: James Boone is a 52 y.o. year old male with end stage OA of his left knee with progressively worsening pain and dysfunction. He has constant pain, with activity and at rest and significant functional deficits with difficulties even with ADLs. He has had extensive non-op management including analgesics, injections of cortisone, and home exercise program, but remains in significant pain with significant dysfunction. Radiographs show bone on bone arthritis medial and patellofemoral. He presents now for left Total Knee Arthroplasty.   Laboratory Data: Admission on 12/30/2012, Discharged on 01/01/2013  Component Date Value Range Status  . WBC 12/31/2012 8.7  4.0 - 10.5 K/uL Final  . RBC 12/31/2012 4.83  4.22 - 5.81 MIL/uL Final  . Hemoglobin 12/31/2012 13.6  13.0 - 17.0 g/dL Final  . HCT 52/84/1324 41.1  39.0 - 52.0 % Final  . MCV 12/31/2012 85.1  78.0 - 100.0 fL Final  . MCH 12/31/2012 28.2  26.0 - 34.0 pg Final  . MCHC  12/31/2012 33.1  30.0 - 36.0 g/dL Final  . RDW 40/02/2724 14.2  11.5 - 15.5 % Final  . Platelets 12/31/2012 192  150 - 400 K/uL Final  . Sodium 12/31/2012 133* 135 - 145 mEq/L Final  . Potassium 12/31/2012 4.8  3.5 - 5.1 mEq/L Final  . Chloride 12/31/2012 99  96 - 112 mEq/L Final  . CO2 12/31/2012 31  19 - 32 mEq/L Final  . Glucose, Bld 12/31/2012 151* 70 - 99 mg/dL Final  . BUN 36/64/4034 16  6 - 23 mg/dL Final  . Creatinine, Ser 12/31/2012 0.96  0.50 - 1.35 mg/dL Final  . Calcium 74/25/9563 9.0  8.4 - 10.5 mg/dL Final  . GFR calc non Af Amer 12/31/2012 >90  >90 mL/min Final  . GFR calc Af Amer 12/31/2012 >90  >90 mL/min Final   Comment:                                 The eGFR has been calculated                          using the CKD EPI equation.                          This calculation has not  been                          validated in all clinical                          situations.                          eGFR's persistently                          <90 mL/min signify                          possible Chronic Kidney Disease.  . WBC 01/01/2013 11.2* 4.0 - 10.5 K/uL Final  . RBC 01/01/2013 4.51  4.22 - 5.81 MIL/uL Final  . Hemoglobin 01/01/2013 12.7* 13.0 - 17.0 g/dL Final  . HCT 16/02/9603 38.1* 39.0 - 52.0 % Final  . MCV 01/01/2013 84.5  78.0 - 100.0 fL Final  . MCH 01/01/2013 28.2  26.0 - 34.0 pg Final  . MCHC 01/01/2013 33.3  30.0 - 36.0 g/dL Final  . RDW 54/01/8118 14.3  11.5 - 15.5 % Final  . Platelets 01/01/2013 203  150 - 400 K/uL Final  . Sodium 01/01/2013 137  135 - 145 mEq/L Final  . Potassium 01/01/2013 4.2  3.5 - 5.1 mEq/L Final  . Chloride 01/01/2013 103  96 - 112 mEq/L Final  . CO2 01/01/2013 27  19 - 32 mEq/L Final  . Glucose, Bld 01/01/2013 111* 70 - 99 mg/dL Final  . BUN 14/78/2956 14  6 - 23 mg/dL Final  . Creatinine, Ser 01/01/2013 0.92  0.50 - 1.35 mg/dL Final  . Calcium 21/30/8657 9.2  8.4 - 10.5 mg/dL Final  . GFR calc non Af Amer 01/01/2013 >90   >90 mL/min Final  . GFR calc Af Amer 01/01/2013 >90  >90 mL/min Final   Comment:                                 The eGFR has been calculated                          using the CKD EPI equation.                          This calculation has not been                          validated in all clinical                          situations.                          eGFR's persistently                          <90 mL/min signify                          possible Chronic Kidney Disease.  Hospital Outpatient Visit on 12/23/2012  Component Date Value  Range Status  . ABO/RH(D) 12/23/2012 O POS   Final  Hospital Outpatient Visit on 12/23/2012  Component Date Value Range Status  . aPTT 12/23/2012 28  24 - 37 seconds Final  . WBC 12/23/2012 7.1  4.0 - 10.5 K/uL Final  . RBC 12/23/2012 5.38  4.22 - 5.81 MIL/uL Final  . Hemoglobin 12/23/2012 15.4  13.0 - 17.0 g/dL Final  . HCT 16/02/9603 46.2  39.0 - 52.0 % Final  . MCV 12/23/2012 85.9  78.0 - 100.0 fL Final  . MCH 12/23/2012 28.6  26.0 - 34.0 pg Final  . MCHC 12/23/2012 33.3  30.0 - 36.0 g/dL Final  . RDW 54/01/8118 14.8  11.5 - 15.5 % Final  . Platelets 12/23/2012 208  150 - 400 K/uL Final  . Sodium 12/23/2012 140  135 - 145 mEq/L Final  . Potassium 12/23/2012 4.0  3.5 - 5.1 mEq/L Final  . Chloride 12/23/2012 102  96 - 112 mEq/L Final  . CO2 12/23/2012 28  19 - 32 mEq/L Final  . Glucose, Bld 12/23/2012 104* 70 - 99 mg/dL Final  . BUN 14/78/2956 25* 6 - 23 mg/dL Final  . Creatinine, Ser 12/23/2012 1.25  0.50 - 1.35 mg/dL Final  . Calcium 21/30/8657 9.8  8.4 - 10.5 mg/dL Final  . Total Protein 12/23/2012 7.3  6.0 - 8.3 g/dL Final  . Albumin 84/69/6295 4.1  3.5 - 5.2 g/dL Final  . AST 28/41/3244 38* 0 - 37 U/L Final  . ALT 12/23/2012 37  0 - 53 U/L Final  . Alkaline Phosphatase 12/23/2012 114  39 - 117 U/L Final  . Total Bilirubin 12/23/2012 0.3  0.3 - 1.2 mg/dL Final  . GFR calc non Af Amer 12/23/2012 64* >90 mL/min Final  . GFR calc Af  Amer 12/23/2012 74* >90 mL/min Final   Comment:                                 The eGFR has been calculated                          using the CKD EPI equation.                          This calculation has not been                          validated in all clinical                          situations.                          eGFR's persistently                          <90 mL/min signify                          possible Chronic Kidney Disease.  Marland Kitchen Prothrombin Time 12/23/2012 13.5  11.6 - 15.2 seconds Final  . INR 12/23/2012 1.05  0.00 - 1.49 Final  . ABO/RH(D) 12/23/2012 O POS   Final  . Antibody Screen 12/23/2012 NEG   Final  . Sample Expiration 12/23/2012 01/02/2013  Final  . Color, Urine 12/23/2012 YELLOW  YELLOW Final  . APPearance 12/23/2012 CLOUDY* CLEAR Final  . Specific Gravity, Urine 12/23/2012 1.023  1.005 - 1.030 Final  . pH 12/23/2012 7.0  5.0 - 8.0 Final  . Glucose, UA 12/23/2012 NEGATIVE  NEGATIVE mg/dL Final  . Hgb urine dipstick 12/23/2012 NEGATIVE  NEGATIVE Final  . Bilirubin Urine 12/23/2012 NEGATIVE  NEGATIVE Final  . Ketones, ur 12/23/2012 NEGATIVE  NEGATIVE mg/dL Final  . Protein, ur 16/02/9603 NEGATIVE  NEGATIVE mg/dL Final  . Urobilinogen, UA 12/23/2012 0.2  0.0 - 1.0 mg/dL Final  . Nitrite 54/01/8118 NEGATIVE  NEGATIVE Final  . Leukocytes, UA 12/23/2012 NEGATIVE  NEGATIVE Final   MICROSCOPIC NOT DONE ON URINES WITH NEGATIVE PROTEIN, BLOOD, LEUKOCYTES, NITRITE, OR GLUCOSE <1000 mg/dL.  Marland Kitchen MRSA, PCR 12/23/2012 NEGATIVE  NEGATIVE Final  . Staphylococcus aureus 12/23/2012 POSITIVE* NEGATIVE Final   Comment:                                 The Xpert SA Assay (FDA                          approved for NASAL specimens                          in patients over 13 years of age),                          is one component of                          a comprehensive surveillance                          program.  Test performance has                          been  validated by [employer] for patients greater                          than or equal to 71 year old.                          It is not intended                          to diagnose infection nor to                          guide or monitor treatment.     X-Rays:Dg Chest 2 View  12/23/2012   *RADIOLOGY REPORT*  Clinical Data: Preop for left knee surgery.  History of hypertension.  CHEST - 2 VIEW  Comparison: 02/23/2005  Findings: The heart, mediastinum and hila are within normal limits. There is minor scarring at the right apex, stable.  The lungs are otherwise clear.  No pleural effusion or pneumothorax.  The bony thorax is intact.  IMPRESSION: No active disease of the chest.  Original Report Authenticated By: Amie Portland, M.D.    EKG: Orders placed during the hospital encounter of 12/23/12  . EKG 12-LEAD  . EKG 12-LEAD     Hospital Course: James Boone is a 52 y.o. who was admitted to [facility]. They were brought to the operating room on 12/30/2012 and underwent Procedure(s): LEFT TOTAL KNEE ARTHROPLASTY.  Patient tolerated the procedure well and was later transferred to the recovery room and then to the orthopaedic floor for postoperative care.  They were given PO and IV analgesics for pain control following their surgery.  They were given 24 hours of postoperative antibiotics of  Anti-infectives   Start     Dose/Rate Route Frequency Ordered Stop   12/30/12 1400  ceFAZolin (ANCEF) IVPB 2 g/50 mL premix     2 g 100 mL/hr over 30 Minutes Intravenous Every 6 hours 12/30/12 1046 12/30/12 2015   12/30/12 0518  ceFAZolin (ANCEF) 3 g in dextrose 5 % 50 mL IVPB     3 g 160 mL/hr over 30 Minutes Intravenous On call to O.R. 12/30/12 0518 12/30/12 0709     and started on DVT prophylaxis in the form of Xarelto.   PT and OT were ordered for total joint protocol.  Discharge planning consulted to help with postop disposition and equipment needs.  Patient had a  decent night on the evening of surgery.  They started to get up OOB with therapy on day one. Hemovac drain was pulled without difficulty.  Continued to work with therapy into day two.  Dressing was changed on day two and the incision was healing well.  Patient was seen in rounds and was ready to go home.   Discharge Medications: Prior to Admission medications   Medication Sig Start Date End Date Taking? Authorizing Provider  amphetamine-dextroamphetamine (ADDERALL XR) 25 MG 24 hr capsule Take 50 mg by mouth every morning.    Yes Historical Provider, MD  atorvastatin (LIPITOR) 10 MG tablet Take 10 mg by mouth every morning.   Yes Historical Provider, MD  buPROPion (WELLBUTRIN XL) 150 MG 24 hr tablet Take 150 mg by mouth every morning.    Yes Historical Provider, MD  lamoTRIgine (LAMICTAL) 100 MG tablet Take 150 mg by mouth daily. Takes 1 and 1/2   Yes Historical Provider, MD  metoprolol (LOPRESSOR) 50 MG tablet Take 50 mg by mouth 2 (two) times daily.   Yes Historical Provider, MD  sertraline (ZOLOFT) 100 MG tablet Take 200 mg by mouth every morning.    Yes Historical Provider, MD  methocarbamol (ROBAXIN) 500 MG tablet Take 1 tablet (500 mg total) by mouth every 6 (six) hours as needed. 01/01/13   Kentaro Alewine, PA-C  oxyCODONE (OXY IR/ROXICODONE) 5 MG immediate release tablet Take 1-2 tablets (5-10 mg total) by mouth every 3 (three) hours as needed. 01/01/13   Shama Monfils Julien Girt, PA-C  rivaroxaban (XARELTO) 10 MG TABS tablet Take 1 tablet (10 mg total) by mouth daily with breakfast. Take Xarelto for two and a half more weeks, then discontinue Xarelto. Once the patient has completed the Xarelto, they may resume the 81 mg Aspirin. 01/01/13   Shade Kaley, PA-C  traMADol (ULTRAM) 50 MG tablet Take 1-2 tablets (50-100 mg total) by mouth every 6 (six) hours as needed (mild pain). 01/01/13   Reola Buckles Julien Girt, PA-C    Diet: Cardiac diet Activity:WBAT Follow-up:in 2 weeks Disposition -  Home Discharged Condition: good       Discharge Orders   Future  Orders Complete By Expires   Call MD / Call 911  As directed    Comments:     If you experience chest pain or shortness of breath, CALL 911 and be transported to the hospital emergency room.  If you develope a fever above 101 F, pus (white drainage) or increased drainage or redness at the wound, or calf pain, call your surgeon's office.   Change dressing  As directed    Comments:     Change dressing daily with sterile 4 x 4 inch gauze dressing and apply TED hose. Do not submerge the incision under water.   Constipation Prevention  As directed    Comments:     Drink plenty of fluids.  Prune juice may be helpful.  You may use a stool softener, such as Colace (over the counter) 100 mg twice a day.  Use MiraLax (over the counter) for constipation as needed.   Diet - low sodium heart healthy  As directed    Discharge instructions  As directed    Comments:     Pick up stool softner and laxative for home. Do not submerge incision under water. May shower. Continue to use ice for pain and swelling from surgery.  Take Xarelto for two and a half more weeks, then discontinue Xarelto. Once the patient has completed the Xarelto, they may resume the 81 mg Aspirin.   Do not put a pillow under the knee. Place it under the heel.  As directed    Do not sit on low chairs, stoools or toilet seats, as it may be difficult to get up from low surfaces  As directed    Driving restrictions  As directed    Comments:     No driving until released by the physician.   Increase activity slowly as tolerated  As directed    Lifting restrictions  As directed    Comments:     No lifting until released by the physician.   Patient may shower  As directed    Comments:     You may shower without a dressing once there is no drainage.  Do not wash over the wound.  If drainage remains, do not shower until drainage stops.   TED hose  As directed     Comments:     Use stockings (TED hose) for 3 weeks on both leg(s).  You may remove them at night for sleeping.   Weight bearing as tolerated  As directed        Medication List    STOP taking these medications       aspirin 81 MG chewable tablet     b complex vitamins tablet     fish oil-omega-3 fatty acids 1000 MG capsule     glucosamine-chondroitin 500-400 MG tablet     meloxicam 15 MG tablet  Commonly known as:  MOBIC     multivitamin with minerals Tabs tablet     testosterone cypionate 200 MG/ML injection  Commonly known as:  DEPOTESTOTERONE CYPIONATE     vitamin C 1000 MG tablet      TAKE these medications       amphetamine-dextroamphetamine 25 MG 24 hr capsule  Commonly known as:  ADDERALL XR  Take 50 mg by mouth every morning.     atorvastatin 10 MG tablet  Commonly known as:  LIPITOR  Take 10 mg by mouth every morning.     buPROPion 150 MG 24 hr tablet  Commonly known as:  WELLBUTRIN XL  Take 150 mg by mouth every morning.     lamoTRIgine 100 MG tablet  Commonly known as:  LAMICTAL  Take 150 mg by mouth daily. Takes 1 and 1/2     methocarbamol 500 MG tablet  Commonly known as:  ROBAXIN  Take 1 tablet (500 mg total) by mouth every 6 (six) hours as needed.     metoprolol 50 MG tablet  Commonly known as:  LOPRESSOR  Take 50 mg by mouth 2 (two) times daily.     oxyCODONE 5 MG immediate release tablet  Commonly known as:  Oxy IR/ROXICODONE  Take 1-2 tablets (5-10 mg total) by mouth every 3 (three) hours as needed.     rivaroxaban 10 MG Tabs tablet  Commonly known as:  XARELTO  - Take 1 tablet (10 mg total) by mouth daily with breakfast. Take Xarelto for two and a half more weeks, then discontinue Xarelto.  - Once the patient has completed the Xarelto, they may resume the 81 mg Aspirin.     sertraline 100 MG tablet  Commonly known as:  ZOLOFT  Take 200 mg by mouth every morning.     traMADol 50 MG tablet  Commonly known as:  ULTRAM  Take 1-2  tablets (50-100 mg total) by mouth every 6 (six) hours as needed (mild pain).       Follow-up Information   Follow up with Loanne Drilling, MD. Schedule an appointment as soon as possible for a visit on 01/14/2013. (Call [phone] tomorrow to make the appointment)    Specialty:  Orthopedic Surgery   Contact information:   [address] [place] [address] [phone]       Signed: Patrica Duel 01/02/2013, 10:03 AM

## 2013-01-02 NOTE — Anesthesia Postprocedure Evaluation (Signed)
  Anesthesia Post-op Note  Patient: James Boone  Procedure(s) Performed: Procedure(s) (LRB): LEFT TOTAL KNEE ARTHROPLASTY (Left)  Patient Location: PACU  Anesthesia Type: Spinal  Level of Consciousness: awake and alert   Airway and Oxygen Therapy: Patient Spontanous Breathing  Post-op Pain: mild  Post-op Assessment: Post-op Vital signs reviewed, Patient's Cardiovascular Status Stable, Respiratory Function Stable, Patent Airway and No signs of Nausea or vomiting  Last Vitals:  Filed Vitals:   01/01/13 1101  BP:   Pulse:   Temp:   Resp: 18    Post-op Vital Signs: stable   Complications: No apparent anesthesia complications

## 2013-01-08 ENCOUNTER — Telehealth: Payer: Self-pay

## 2013-01-08 MED ORDER — "NEEDLE (DISP) 23G X 1-1/2"" MISC": Status: DC

## 2013-01-08 NOTE — Telephone Encounter (Signed)
RX sent,  note to pharmacy to have patient check needles prior to leaving counter

## 2013-01-08 NOTE — Telephone Encounter (Signed)
Message left on triage voicemail: please call to discuss refill request for needles  I called patient for clarification on the type of needle he needs for we do not have a fill history for needles. Patient was unable to clarify for he had already used the last one and disposed of it. I asked if patient if he received needles from current pharmacy on file for I could contact them and have them clarify, patient states it's been several years since he had to get needles and it was not at his current pharmacy, patient will try to locate needle box and call back with more information.

## 2013-01-08 NOTE — Telephone Encounter (Signed)
Pt called back and wanted to let you know the needles that he uses are BD 23G 1 1/2". Patient stated you had all remaining information.

## 2013-01-21 ENCOUNTER — Ambulatory Visit: Payer: 59 | Attending: Orthopedic Surgery | Admitting: Physical Therapy

## 2013-01-21 DIAGNOSIS — R609 Edema, unspecified: Secondary | ICD-10-CM | POA: Insufficient documentation

## 2013-01-21 DIAGNOSIS — M25569 Pain in unspecified knee: Secondary | ICD-10-CM | POA: Insufficient documentation

## 2013-01-21 DIAGNOSIS — M25669 Stiffness of unspecified knee, not elsewhere classified: Secondary | ICD-10-CM | POA: Insufficient documentation

## 2013-01-21 DIAGNOSIS — IMO0001 Reserved for inherently not codable concepts without codable children: Secondary | ICD-10-CM | POA: Insufficient documentation

## 2013-01-21 DIAGNOSIS — Z96659 Presence of unspecified artificial knee joint: Secondary | ICD-10-CM | POA: Insufficient documentation

## 2013-01-23 ENCOUNTER — Ambulatory Visit: Payer: 59 | Admitting: Physical Therapy

## 2013-01-27 ENCOUNTER — Ambulatory Visit: Payer: 59 | Admitting: Physical Therapy

## 2013-01-31 ENCOUNTER — Ambulatory Visit: Payer: 59 | Admitting: Physical Therapy

## 2013-02-03 ENCOUNTER — Ambulatory Visit: Payer: 59 | Admitting: Physical Therapy

## 2013-02-07 ENCOUNTER — Ambulatory Visit: Payer: 59 | Admitting: Physical Therapy

## 2013-02-10 ENCOUNTER — Ambulatory Visit: Payer: 59 | Admitting: Physical Therapy

## 2013-02-12 ENCOUNTER — Ambulatory Visit: Payer: 59 | Admitting: Physical Therapy

## 2013-03-17 ENCOUNTER — Other Ambulatory Visit: Payer: Self-pay | Admitting: Internal Medicine

## 2013-03-19 ENCOUNTER — Other Ambulatory Visit: Payer: Self-pay | Admitting: *Deleted

## 2013-03-19 ENCOUNTER — Telehealth: Payer: Self-pay | Admitting: *Deleted

## 2013-03-19 NOTE — Telephone Encounter (Signed)
Ok refill 81-month and 1 RF,  needs office visit before next refill. Please be sure patient knows

## 2013-03-19 NOTE — Telephone Encounter (Signed)
Patient is requesting a refill for testosterone cypionate (DEPOTESTOTERONE CYPIONATE) 200 MG/ML injection  Last lab for testosterone free and total was 08/21/2012. Last ov was 04/26/2012 Ok to refill?

## 2013-03-19 NOTE — Telephone Encounter (Signed)
Refilled and pt notified

## 2013-04-22 ENCOUNTER — Other Ambulatory Visit: Payer: Self-pay | Admitting: Internal Medicine

## 2013-04-23 NOTE — Telephone Encounter (Signed)
Metoprolol refilled per protocol. OV due

## 2013-05-21 ENCOUNTER — Other Ambulatory Visit: Payer: Self-pay | Admitting: Internal Medicine

## 2013-05-21 NOTE — Telephone Encounter (Signed)
rx refilled per protocol. DJR  

## 2013-06-26 ENCOUNTER — Other Ambulatory Visit: Payer: Self-pay | Admitting: Internal Medicine

## 2013-07-29 ENCOUNTER — Other Ambulatory Visit: Payer: Self-pay | Admitting: Internal Medicine

## 2013-08-25 ENCOUNTER — Other Ambulatory Visit: Payer: Self-pay | Admitting: Internal Medicine

## 2013-08-29 ENCOUNTER — Encounter: Payer: Self-pay | Admitting: Internal Medicine

## 2013-08-29 ENCOUNTER — Ambulatory Visit (INDEPENDENT_AMBULATORY_CARE_PROVIDER_SITE_OTHER): Payer: 59 | Admitting: Internal Medicine

## 2013-08-29 ENCOUNTER — Telehealth: Payer: Self-pay | Admitting: Internal Medicine

## 2013-08-29 VITALS — BP 127/76 | HR 77 | Temp 98.0°F | Wt 299.0 lb

## 2013-08-29 DIAGNOSIS — R739 Hyperglycemia, unspecified: Secondary | ICD-10-CM

## 2013-08-29 DIAGNOSIS — E291 Testicular hypofunction: Secondary | ICD-10-CM

## 2013-08-29 DIAGNOSIS — R7309 Other abnormal glucose: Secondary | ICD-10-CM

## 2013-08-29 DIAGNOSIS — E785 Hyperlipidemia, unspecified: Secondary | ICD-10-CM

## 2013-08-29 DIAGNOSIS — I10 Essential (primary) hypertension: Secondary | ICD-10-CM

## 2013-08-29 DIAGNOSIS — R609 Edema, unspecified: Secondary | ICD-10-CM

## 2013-08-29 NOTE — Progress Notes (Signed)
Subjective:    Patient ID: James Boone, male    DOB: March 11, 1961, 53 y.o.   MRN: [id number]  DOS:  08/29/2013 Type of  visit:  ROV, Last office visit 2013. States he gets "physicals" at work Hyperlipidemia, good compliance with Crestor. High blood pressure, good compliance of medications, ambulatory BPs reportedly within normal. Hyperglycemia, not on any medication, unable to exercise lately due to different injuries, diet "need to do better". Hypogonadism, on HRT every 2 weeks, sometimes forgets. Sees psychiatrist for other issues.  ROS Denies chest pain or difficulty breathing. Having bilateral lower extremity edema at the end of the day, not severe. Denies nausea, vomiting, diarrhea. No dysuria, gross hematuria or difficulty urinating.   Past Medical History  Diagnosis Date  . Hyperlipidemia   . Depression   . Hypertension   . Hypogonadism male   . ADD (attention deficit disorder)   . Abnormal liver function test   . Allergic rhinitis   . Hyperglycemia   . Shortness of breath     with exertion   . Sleep apnea 2005    dx in 2005 , on CPAP  . Arthritis     Past Surgical History  Procedure Laterality Date  . Vasectomy    . Umbilical hernia repair    . Knee arthroscopy  05/2008    left, Dr.Geoffrey  . Knee arthroscopy  09/2009    right, Dr.Geoffrey  . Total knee arthroplasty Left 12/30/2012    Procedure: LEFT TOTAL KNEE ARTHROPLASTY;  Surgeon: Loanne Drilling, MD;  Location: [facility];  Service: Orthopedics;  Laterality: Left;    History   Social History  . Marital Status: Married    Spouse Name: N/A    Number of Children: 2  . Years of Education: N/A   Occupational History  . [employer]     Social History Main Topics  . Smoking status: Never Smoker   . Smokeless tobacco: Never Used  . Alcohol Use: Yes     Comment: socially   . Drug Use: No  . Sexual Activity: Yes   Other Topics Concern  . Not on file   Social History Narrative   Lives w/ wife          Medication List       This list is accurate as of: 08/29/13 11:59 PM.  Always use your most recent med list.               amphetamine-dextroamphetamine 25 MG 24 hr capsule  Commonly known as:  ADDERALL XR  Take 50 mg by mouth every morning.     atorvastatin 10 MG tablet  Commonly known as:  LIPITOR  Take 10 mg by mouth every morning.     buPROPion 150 MG 24 hr tablet  Commonly known as:  WELLBUTRIN XL  Take 150 mg by mouth every morning.     lamoTRIgine 100 MG tablet  Commonly known as:  LAMICTAL  Take 150 mg by mouth daily. Takes 1 and 1/2     metoprolol 50 MG tablet  Commonly known as:  LOPRESSOR  TAKE ONE TABLET BY MOUTH TWICE DAILY **DUE  FOR  OFFICE  VISIT  PLEASE  CALL  [phone]**     NEEDLE (DISP) 23 G 23G X 1-1/2" Misc  Use one needle as directed with Testosterone Injection     sertraline 100 MG tablet  Commonly known as:  ZOLOFT  Take 200 mg by mouth every morning.  testosterone cypionate 200 MG/ML injection  Commonly known as:  DEPOTESTOTERONE CYPIONATE  INJECT ONE MILLILITER INTRAMUSCULARLY EVERY 2 WEEKS           Objective:   Physical Exam BP 127/76  Pulse 77  Temp(Src) 98 F (36.7 C)  Wt 299 lb (135.626 kg)  SpO2 96% General -- alert, well-developed, NAD.  Lungs -- normal respiratory effort, no intercostal retractions, no accessory muscle use, and normal breath sounds.  Heart-- normal rate, regular rhythm, no murmur.   Extremities--+/+++ pretibial edema bilaterally ; Skin hyperpigmented, no warm or tender Neurologic--  alert & oriented X3. Speech normal, gait normal, strength normal in all extremities.   Psych-- Cognition and judgment appear intact. Cooperative with normal attention span and concentration. No anxious or depressed appearing.        Assessment & Plan:

## 2013-08-29 NOTE — Progress Notes (Signed)
Pre visit review using our clinic review tool, if applicable. No additional management support is needed unless otherwise documented below in the visit note. 

## 2013-08-29 NOTE — Telephone Encounter (Signed)
Relevant patient education assigned to patient using Emmi. ° °

## 2013-08-29 NOTE — Assessment & Plan Note (Signed)
Seems  well-controlled, labs 

## 2013-08-29 NOTE — Assessment & Plan Note (Signed)
On injections every 2 weeks, sometimes forgets. Recommend good compliance, check labs

## 2013-08-29 NOTE — Assessment & Plan Note (Signed)
Check the A1c 

## 2013-08-29 NOTE — Assessment & Plan Note (Signed)
Bilateral lower extremity edema, seems  he is developing early stasis dermatitis. Recommend compression stockings

## 2013-08-29 NOTE — Assessment & Plan Note (Signed)
Good compliance with medications, not fasting today, schedule labs

## 2013-08-29 NOTE — Patient Instructions (Signed)
Schedule fasting lab for next  week.  CMP--- hypertension Total and free testosterone ----hypogonadism FLP ----high cholesterol A1c --- hyperglycemia  Schedule visit 3 months from today, physical exam, fasting as well

## 2013-09-02 ENCOUNTER — Other Ambulatory Visit: Payer: 59

## 2013-09-03 ENCOUNTER — Other Ambulatory Visit (INDEPENDENT_AMBULATORY_CARE_PROVIDER_SITE_OTHER): Payer: 59

## 2013-09-03 DIAGNOSIS — R739 Hyperglycemia, unspecified: Secondary | ICD-10-CM

## 2013-09-03 DIAGNOSIS — I10 Essential (primary) hypertension: Secondary | ICD-10-CM

## 2013-09-03 DIAGNOSIS — R7309 Other abnormal glucose: Secondary | ICD-10-CM

## 2013-09-03 DIAGNOSIS — E291 Testicular hypofunction: Secondary | ICD-10-CM

## 2013-09-03 DIAGNOSIS — E7800 Pure hypercholesterolemia, unspecified: Secondary | ICD-10-CM

## 2013-09-03 LAB — COMPREHENSIVE METABOLIC PANEL
ALBUMIN: 3.9 g/dL (ref 3.5–5.2)
ALT: 39 U/L (ref 0–53)
AST: 35 U/L (ref 0–37)
Alkaline Phosphatase: 84 U/L (ref 39–117)
BUN: 22 mg/dL (ref 6–23)
CO2: 30 meq/L (ref 19–32)
CREATININE: 1.2 mg/dL (ref 0.4–1.5)
Calcium: 9.3 mg/dL (ref 8.4–10.5)
Chloride: 101 mEq/L (ref 96–112)
GFR: 66.27 mL/min (ref >60.00)
Glucose, Bld: 88 mg/dL (ref 70–99)
POTASSIUM: 4.1 meq/L (ref 3.5–5.1)
Sodium: 139 mEq/L (ref 135–145)
Total Bilirubin: 0.6 mg/dL (ref 0.3–1.2)
Total Protein: 7.2 g/dL (ref 6.0–8.3)

## 2013-09-03 LAB — LIPID PANEL
CHOL/HDL RATIO: 4
Cholesterol: 160 mg/dL (ref 0–200)
HDL: 38.7 mg/dL — ABNORMAL LOW (ref >39.00)
LDL Cholesterol: 99 mg/dL (ref 0–99)
TRIGLYCERIDES: 112 mg/dL (ref 0.0–149.0)
VLDL: 22.4 mg/dL (ref 0.0–40.0)

## 2013-09-03 LAB — HEMOGLOBIN A1C: Hgb A1c MFr Bld: 5.5 % (ref 4.6–6.5)

## 2013-09-04 LAB — TESTOSTERONE, FREE, TOTAL, SHBG
Sex Hormone Binding: 36 nmol/L (ref 13–71)
TESTOSTERONE FREE: 35.3 pg/mL — AB (ref 47.0–244.0)
Testosterone-% Free: 1.8 % (ref 1.6–2.9)
Testosterone: 196 ng/dL — ABNORMAL LOW (ref 300–890)

## 2013-09-09 ENCOUNTER — Telehealth: Payer: Self-pay | Admitting: Internal Medicine

## 2013-09-09 MED ORDER — METOPROLOL TARTRATE 50 MG PO TABS: ORAL_TABLET | ORAL | Status: DC

## 2013-09-09 NOTE — Telephone Encounter (Signed)
Patient called to refill his metoprolol (LOPRESSOR) 50 MG tablet. Please advise

## 2013-09-09 NOTE — Telephone Encounter (Signed)
rx sent

## 2013-09-27 ENCOUNTER — Other Ambulatory Visit: Payer: Self-pay | Admitting: Internal Medicine

## 2013-09-29 NOTE — Telephone Encounter (Signed)
Testosterone cypionate refilled per protocol. JG//CMA

## 2013-11-18 ENCOUNTER — Telehealth: Payer: Self-pay | Admitting: Internal Medicine

## 2013-11-18 NOTE — Telephone Encounter (Signed)
Patient called to get his lab results over the phone. Please advise

## 2013-11-18 NOTE — Telephone Encounter (Signed)
Spoke with patient and advised per labs. Sent copy.

## 2013-11-27 ENCOUNTER — Telehealth: Payer: Self-pay | Admitting: *Deleted

## 2013-11-27 NOTE — Telephone Encounter (Signed)
LMOVM to return call regarding CPE (previst)

## 2013-11-27 NOTE — Telephone Encounter (Signed)
Medication List and allergies:   90 Day supply/mail order:  [facility]  Immunizations Due:   A/P FH, PSH, or Personal Hx: Flu vaccine:  Tdap: Tdap 04/2012 PNA: Shingles: CCS: 08/2009 Diverticulosis, hemorrhoids-->10 years PSA: 03/2011 1.07  To Discuss with Provider:

## 2013-11-28 ENCOUNTER — Encounter: Payer: Self-pay | Admitting: Internal Medicine

## 2013-11-28 ENCOUNTER — Ambulatory Visit (INDEPENDENT_AMBULATORY_CARE_PROVIDER_SITE_OTHER): Payer: 59 | Admitting: Internal Medicine

## 2013-11-28 VITALS — BP 133/74 | HR 61 | Temp 97.8°F | Ht 76.0 in | Wt 297.0 lb

## 2013-11-28 DIAGNOSIS — Z0000 Encounter for general adult medical examination without abnormal findings: Secondary | ICD-10-CM

## 2013-11-28 DIAGNOSIS — E291 Testicular hypofunction: Secondary | ICD-10-CM

## 2013-11-28 DIAGNOSIS — M19071 Primary osteoarthritis, right ankle and foot: Secondary | ICD-10-CM

## 2013-11-28 DIAGNOSIS — R609 Edema, unspecified: Secondary | ICD-10-CM

## 2013-11-28 LAB — BASIC METABOLIC PANEL
BUN: 30 mg/dL — ABNORMAL HIGH (ref 6–23)
CHLORIDE: 100 meq/L (ref 96–112)
CO2: 28 mEq/L (ref 19–32)
Calcium: 9.8 mg/dL (ref 8.4–10.5)
Creatinine, Ser: 1.3 mg/dL (ref 0.4–1.5)
GFR: 59.37 mL/min — ABNORMAL LOW (ref >60.00)
GLUCOSE: 91 mg/dL (ref 70–99)
POTASSIUM: 4.5 meq/L (ref 3.5–5.1)
Sodium: 136 mEq/L (ref 135–145)

## 2013-11-28 LAB — CBC WITH DIFFERENTIAL/PLATELET
Basophils Absolute: 0 10*3/uL (ref 0.0–0.1)
Basophils Relative: 0.3 % (ref 0.0–3.0)
EOS PCT: 6.1 % — AB (ref 0.0–5.0)
Eosinophils Absolute: 0.4 10*3/uL (ref 0.0–0.7)
HEMATOCRIT: 43.9 % (ref 39.0–52.0)
HEMOGLOBIN: 14.6 g/dL (ref 13.0–17.0)
LYMPHS ABS: 1.8 10*3/uL (ref 0.7–4.0)
Lymphocytes Relative: 27.2 % (ref 12.0–46.0)
MCHC: 33.3 g/dL (ref 30.0–36.0)
MCV: 86.6 fl (ref 78.0–100.0)
MONOS PCT: 10.1 % (ref 3.0–12.0)
Monocytes Absolute: 0.7 10*3/uL (ref 0.1–1.0)
NEUTROS ABS: 3.6 10*3/uL (ref 1.4–7.7)
Neutrophils Relative %: 56.3 % (ref 43.0–77.0)
Platelets: 209 10*3/uL (ref 150.0–400.0)
RBC: 5.07 Mil/uL (ref 4.22–5.81)
RDW: 15.7 % — AB (ref 11.5–15.5)
WBC: 6.5 10*3/uL (ref 4.0–10.5)

## 2013-11-28 LAB — PSA: PSA: 1.19 ng/mL (ref 0.10–4.00)

## 2013-11-28 NOTE — Patient Instructions (Signed)
Get your blood work before you leave    Next visit is for routine check up in 6 months No need to come back fasting Please make an appointment    

## 2013-11-28 NOTE — Assessment & Plan Note (Addendum)
Td 2013 he has a positive family history of heart dz: on ASA 08-2009 colonoscopy-- normal, next 10 years  Discussed healthy diet, exercise  Chart reviewed, will order pertinent labs

## 2013-11-28 NOTE — Assessment & Plan Note (Signed)
Still has some edema, encouraged the use of a compression stocking

## 2013-11-28 NOTE — Assessment & Plan Note (Addendum)
Labs, taking his shots almost regularly but  sometimes half of difficulty getting the prescription filled

## 2013-11-28 NOTE — Assessment & Plan Note (Signed)
Has an appointment to see orthopedic surgery regards right ankle pain, his exercise  is quite limited at this time

## 2013-11-28 NOTE — Progress Notes (Signed)
Pre visit review using our clinic review tool, if applicable. No additional management support is needed unless otherwise documented below in the visit note. 

## 2013-11-28 NOTE — Progress Notes (Signed)
Subjective:    Patient ID: James Boone, male    DOB: 05/30/1960, 53 y.o.   MRN: [id number]  DOS:  11/28/2013 Type of visit - description: CPX  History:  In general doing well  ROS Exercise-- Limited due to the pain in the ankle No  CP, SOB No palpitations, cont w/ lower extremity edema Denies  nausea, vomiting diarrhea, blood in the stools (-) cough, sputum production (-) wheezing, chest congestion No dysuria, gross hematuria, difficulty urinating  No headaches  Denies dizziness   Past Medical History  Diagnosis Date  . Hyperlipidemia   . Depression   . Hypertension   . Hypogonadism male   . ADD (attention deficit disorder)   . Abnormal liver function test   . Allergic rhinitis   . Hyperglycemia   . Shortness of breath     with exertion   . Sleep apnea 2005    dx in 2005 , on CPAP  . Arthritis     Past Surgical History  Procedure Laterality Date  . Vasectomy    . Umbilical hernia repair    . Knee arthroscopy  05/2008    left, Dr.Geoffrey  . Knee arthroscopy  09/2009    right, Dr.Geoffrey  . Total knee arthroplasty Left 12/30/2012    Procedure: LEFT TOTAL KNEE ARTHROPLASTY;  Surgeon: Loanne DrillingFrank V Aluisio, MD;  Location: [facility];  Service: Orthopedics;  Laterality: Left;    Family History  Problem Relation Age of Onset  . Depression Sister     no suicide   . Stroke Father     ag ~ 1264  . Coronary artery disease Father     smoker, MI and a CABG at 55 y/o  . Colon cancer Neg Hx   . Prostate cancer Neg Hx   . Diabetes Neg Hx     History   Social History  . Marital Status: Married    Spouse Name: N/A    Number of Children: 2  . Years of Education: N/A   Occupational History  . [employer]     Social History Main Topics  . Smoking status: Never Smoker   . Smokeless tobacco: Never Used  . Alcohol Use: Yes     Comment: socially   . Drug Use: No  . Sexual Activity: Yes   Other Topics Concern  . Not on file   Social History Narrative   Lives w/ wife,  grown chidren        Medication List       This list is accurate as of: 11/28/13 11:59 PM.  Always use your most recent med list.               amphetamine-dextroamphetamine 25 MG 24 hr capsule  Commonly known as:  ADDERALL XR  Take 50 mg by mouth every morning.     atorvastatin 10 MG tablet  Commonly known as:  LIPITOR  Take 10 mg by mouth every morning.     buPROPion 150 MG 24 hr tablet  Commonly known as:  WELLBUTRIN XL  Take 150 mg by mouth every morning.     lamoTRIgine 100 MG tablet  Commonly known as:  LAMICTAL  Take 150 mg by mouth daily. Takes 1 and 1/2     metoprolol 50 MG tablet  Commonly known as:  LOPRESSOR  TAKE ONE TABLET BY MOUTH TWICE DAILY     NEEDLE (DISP) 23 G 23G X 1-1/2" Misc  Use one needle as directed with Testosterone  Injection     sertraline 100 MG tablet  Commonly known as:  ZOLOFT  Take 200 mg by mouth every morning.     testosterone cypionate 200 MG/ML injection  Commonly known as:  DEPOTESTOTERONE CYPIONATE  Inject one milliliter intramuscularly every 2 weeks consistently           Objective:   Physical Exam BP 133/74  Pulse 61  Temp(Src) 97.8 F (36.6 C)  Ht 6\' 4"  (1.93 m)  Wt 297 lb (134.718 kg)  BMI 36.17 kg/m2  SpO2 96% General -- alert, well-developed, NAD.  Neck --no thyromegaly  HEENT-- Not pale.  Lungs -- normal respiratory effort, no intercostal retractions, no accessory muscle use, and normal breath sounds.  Heart-- normal rate, regular rhythm, no murmur.  Abdomen-- Not distended, good bowel sounds,soft, non-tender.  Rectal-- No external abnormalities noted. Normal sphincter tone. No rectal masses or tenderness. Stool brown  Prostate--Prostate gland firm and smooth, no enlargement, nodularity, tenderness, mass, asymmetry or induration. Extremities--+/+++ pretibial edema bilaterally  Neurologic--  alert & oriented X3. Speech normal, gait limited by R ankle deformity  Psych-- Cognition and judgment appear  intact. Cooperative with normal attention span and concentration. No anxious or depressed appearing.        Assessment & Plan:

## 2013-12-01 LAB — TESTOSTERONE, FREE, TOTAL, SHBG
Sex Hormone Binding: 38 nmol/L (ref 13–71)
TESTOSTERONE-% FREE: 2.5 % (ref 1.6–2.9)
Testosterone, Free: 351.1 pg/mL — ABNORMAL HIGH (ref 47.0–244.0)
Testosterone: 1425.43 ng/dL — ABNORMAL HIGH (ref 300–890)

## 2013-12-04 ENCOUNTER — Encounter: Payer: Self-pay | Admitting: *Deleted

## 2013-12-14 ENCOUNTER — Other Ambulatory Visit: Payer: Self-pay | Admitting: Internal Medicine

## 2013-12-17 ENCOUNTER — Telehealth: Payer: Self-pay | Admitting: *Deleted

## 2013-12-17 NOTE — Telephone Encounter (Signed)
Received Referral for Pre-Operative Clearance form via fax [facility].  Placed in folder for Dr. Drue NovelPaz to review and sign.//AB/CMA

## 2014-01-13 ENCOUNTER — Encounter: Payer: Self-pay | Admitting: Internal Medicine

## 2014-01-13 ENCOUNTER — Ambulatory Visit (INDEPENDENT_AMBULATORY_CARE_PROVIDER_SITE_OTHER): Payer: 59 | Admitting: Internal Medicine

## 2014-01-13 VITALS — BP 140/82 | HR 57 | Temp 98.3°F | Wt 290.1 lb

## 2014-01-13 DIAGNOSIS — M19079 Primary osteoarthritis, unspecified ankle and foot: Secondary | ICD-10-CM

## 2014-01-13 DIAGNOSIS — E291 Testicular hypofunction: Secondary | ICD-10-CM

## 2014-01-13 DIAGNOSIS — M19071 Primary osteoarthritis, right ankle and foot: Secondary | ICD-10-CM

## 2014-01-13 NOTE — Patient Instructions (Addendum)
Get your blood work before you leave    Next visit is for routine check up by 05-2014   

## 2014-01-13 NOTE — Progress Notes (Signed)
Subjective:    Patient ID: James Boone, male    DOB: 08/04/1960, 53 y.o.   MRN: [id number]  DOS:  01/13/2014 Type of visit - description: surg clearence  History: To have a right total knee replacement  02-2014, needs surgical clearance. In general the patient feels well. Medication list reviewed, good compliance. Recent labs reviewed, is very good except for increased testosterone level. He has a sleep apnea and uses a CPAP routinely.   ROS Denies chest pain or palpitations. No dyspnea on exertion, no orthopnea. No nausea, vomiting or blood in the stools. No cough, sputum production or wheezing. Anxiety - depression currently well controlled.   Past Medical History  Diagnosis Date  . Hyperlipidemia   . Depression   . Hypertension   . Hypogonadism male   . ADD (attention deficit disorder)   . Abnormal liver function test   . Allergic rhinitis   . Hyperglycemia   . Shortness of breath     with exertion   . Sleep apnea 2005    dx in 2005 , on CPAP  . Arthritis     Past Surgical History  Procedure Laterality Date  . Vasectomy    . Umbilical hernia repair    . Knee arthroscopy  05/2008    left, Dr.Geoffrey  . Knee arthroscopy  09/2009    right, Dr.Geoffrey  . Total knee arthroplasty Left 12/30/2012    Procedure: LEFT TOTAL KNEE ARTHROPLASTY;  Surgeon: Loanne Drilling, MD;  Location: [facility];  Service: Orthopedics;  Laterality: Left;    History   Social History  . Marital Status: Married    Spouse Name: N/A    Number of Children: 2  . Years of Education: N/A   Occupational History  . [employer]     Social History Main Topics  . Smoking status: Never Smoker   . Smokeless tobacco: Never Used  . Alcohol Use: Yes     Comment: socially   . Drug Use: No  . Sexual Activity: Yes   Other Topics Concern  . Not on file   Social History Narrative   Lives w/ wife, grown chidren        Medication List       This list is accurate as of: 01/13/14 11:59 PM.   Always use your most recent med list.               amphetamine-dextroamphetamine 25 MG 24 hr capsule  Commonly known as:  ADDERALL XR  Take 50 mg by mouth every morning.     atorvastatin 10 MG tablet  Commonly known as:  LIPITOR  Take 10 mg by mouth every morning.     buPROPion 150 MG 24 hr tablet  Commonly known as:  WELLBUTRIN XL  Take 150 mg by mouth every morning.     lamoTRIgine 100 MG tablet  Commonly known as:  LAMICTAL  Take 150 mg by mouth daily. Takes 1 and 1/2     metoprolol 50 MG tablet  Commonly known as:  LOPRESSOR  TAKE ONE TABLET BY MOUTH TWICE DAILY     NEEDLE (DISP) 23 G 23G X 1-1/2" Misc  Use one needle as directed with Testosterone Injection     sertraline 100 MG tablet  Commonly known as:  ZOLOFT  Take 200 mg by mouth every morning.     testosterone cypionate 200 MG/ML injection  Commonly known as:  DEPOTESTOTERONE CYPIONATE  Inject 3/4  milliliter intramuscularly every 2 weeks  consistently           Objective:   Physical Exam BP 140/82  Pulse 57  Temp(Src) 98.3 F (36.8 C) (Oral)  Wt 290 lb 2 oz (131.6 kg)  SpO2 96% General -- alert, well-developed, NAD.  Neck -- No JVD at 45 HEENT-- Not pale.  Lungs -- normal respiratory effort, no intercostal retractions, no accessory muscle use, and normal breath sounds.  Heart-- normal rate, regular rhythm, no murmur.  Abdomen-- Not distended, good bowel sounds,soft, non-tender.  Extremities-- trace  pretibial edema bilaterally  Neurologic--  alert & oriented X3. Speech normal, gait appropriate for age, strength symmetric and appropriate for age.  Psych-- Cognition and judgment appear intact. Cooperative with normal attention span and concentration. No anxious or depressed appearing.      Assessment & Plan:

## 2014-01-13 NOTE — Progress Notes (Signed)
Pre-visit discussion using our clinic review tool. No additional management support is needed unless otherwise documented below in the visit note.  

## 2014-01-13 NOTE — Assessment & Plan Note (Signed)
Last testosterone level elevated, we decrease the dose of   his injection to 3/4  ML Labs

## 2014-01-13 NOTE — Assessment & Plan Note (Signed)
Needs  a right total knee replacement. Needs surgical clearance. The patient is doing well, chronic medical problems are well-controlled, review of systems is negative for cardio-pulmonary issues; last EKG and chest x-ray last year and normal. No history of known heart disease or previous a stress test. Based on this information the patient is clear for surgery, he will need routine pre-surgical labs-ekg, form faxed to ortho

## 2014-01-14 LAB — TESTOSTERONE, FREE, TOTAL, SHBG
SEX HORMONE BINDING: 46 nmol/L (ref 13–71)
TESTOSTERONE FREE: 543.5 pg/mL — AB (ref 47.0–244.0)
Testosterone-% Free: 2.5 % (ref 1.6–2.9)
Testosterone: 2141.63 ng/dL — ABNORMAL HIGH (ref 300–890)

## 2014-01-18 ENCOUNTER — Other Ambulatory Visit: Payer: Self-pay | Admitting: Physician Assistant

## 2014-01-19 ENCOUNTER — Telehealth: Payer: Self-pay | Admitting: Internal Medicine

## 2014-01-19 NOTE — Telephone Encounter (Signed)
Advise patient, he is taking too much testosterone, please ask the patient to bring his testosterone bottle to the front desk w/ a note saying how many Milliliters and how often he is taking it. Will call him back once we had that info

## 2014-01-19 NOTE — Telephone Encounter (Signed)
LMOM for Pt to return call.  

## 2014-02-04 NOTE — Telephone Encounter (Signed)
LMOM for Pt to return call.  

## 2014-02-06 NOTE — Telephone Encounter (Signed)
Letter mailed to Pt informing him his testosterone levels are extremely high, and per Dr. Drue Novel he needs to discontinue taking medication and inform us to how much testosterone medication he is currently taking.

## 2014-02-19 ENCOUNTER — Other Ambulatory Visit: Payer: Self-pay | Admitting: Orthopedic Surgery

## 2014-02-19 NOTE — Progress Notes (Signed)
Preoperative surgical orders have been place into the Epic hospital system for James Boone on 02/19/2014, 1:30 PM  by Patrica DuelPERKINS, Araya Roel for surgery on 03/16/2014.  Preop Total Knee orders including Experal, IV Tylenol, and IV Decadron as long as there are no contraindications to the above medications. Avel Peacerew Delron Comer, PA-C

## 2014-02-24 NOTE — Telephone Encounter (Signed)
Completed.//AB/CMA 

## 2014-03-04 ENCOUNTER — Other Ambulatory Visit (HOSPITAL_COMMUNITY): Payer: Self-pay | Admitting: *Deleted

## 2014-03-04 NOTE — Progress Notes (Signed)
01/13/2014-Medical clearance from Dr. Drue NovelPaz on chart.

## 2014-03-05 ENCOUNTER — Encounter (HOSPITAL_COMMUNITY): Payer: Self-pay | Admitting: Pharmacy Technician

## 2014-03-05 ENCOUNTER — Ambulatory Visit (HOSPITAL_COMMUNITY)
Admission: RE | Admit: 2014-03-05 | Discharge: 2014-03-05 | Disposition: A | Discharge status: Home / Self Care | Payer: 59 | Source: Ambulatory Visit | Attending: Anesthesiology | Admitting: Anesthesiology

## 2014-03-05 ENCOUNTER — Encounter (HOSPITAL_COMMUNITY): Payer: Self-pay

## 2014-03-05 ENCOUNTER — Encounter (HOSPITAL_COMMUNITY)
Admission: RE | Admit: 2014-03-05 | Discharge: 2014-03-05 | Disposition: A | Discharge status: Home / Self Care | Payer: 59 | Source: Ambulatory Visit | Attending: Orthopedic Surgery | Admitting: Orthopedic Surgery

## 2014-03-05 DIAGNOSIS — Z01818 Encounter for other preprocedural examination: Secondary | ICD-10-CM | POA: Diagnosis not present

## 2014-03-05 DIAGNOSIS — I10 Essential (primary) hypertension: Secondary | ICD-10-CM

## 2014-03-05 DIAGNOSIS — M179 Osteoarthritis of knee, unspecified: Secondary | ICD-10-CM | POA: Insufficient documentation

## 2014-03-05 DIAGNOSIS — M1990 Unspecified osteoarthritis, unspecified site: Secondary | ICD-10-CM | POA: Diagnosis not present

## 2014-03-05 HISTORY — DX: Pure hypercholesterolemia, unspecified: E78.00

## 2014-03-05 HISTORY — DX: Localized edema: R60.0

## 2014-03-05 HISTORY — DX: Asymptomatic varicose veins of unspecified lower extremity: I83.90

## 2014-03-05 HISTORY — DX: Edema, unspecified: R60.9

## 2014-03-05 HISTORY — DX: Dorsalgia, unspecified: M54.9

## 2014-03-05 HISTORY — DX: Unspecified hemorrhoids: K64.9

## 2014-03-05 LAB — COMPREHENSIVE METABOLIC PANEL
ALK PHOS: 124 U/L — AB (ref 39–117)
ALT: 44 U/L (ref 0–53)
AST: 38 U/L — ABNORMAL HIGH (ref 0–37)
Albumin: 4.2 g/dL (ref 3.5–5.2)
Anion gap: 13 (ref 5–15)
BUN: 25 mg/dL — AB (ref 6–23)
CO2: 28 meq/L (ref 19–32)
Calcium: 10.2 mg/dL (ref 8.4–10.5)
Chloride: 99 mEq/L (ref 96–112)
Creatinine, Ser: 1.08 mg/dL (ref 0.50–1.35)
GFR, EST AFRICAN AMERICAN: 87 mL/min — AB (ref >90)
GFR, EST NON AFRICAN AMERICAN: 75 mL/min — AB (ref >90)
GLUCOSE: 90 mg/dL (ref 70–99)
POTASSIUM: 5 meq/L (ref 3.7–5.3)
Sodium: 140 mEq/L (ref 137–147)
Total Bilirubin: 0.4 mg/dL (ref 0.3–1.2)
Total Protein: 7.8 g/dL (ref 6.0–8.3)

## 2014-03-05 LAB — SURGICAL PCR SCREEN
MRSA, PCR: NEGATIVE
Staphylococcus aureus: POSITIVE — AB

## 2014-03-05 LAB — PROTIME-INR
INR: 1.08 (ref 0.00–1.49)
Prothrombin Time: 14.2 seconds (ref 11.6–15.2)

## 2014-03-05 LAB — CBC
HEMATOCRIT: 47.6 % (ref 39.0–52.0)
HEMOGLOBIN: 15.8 g/dL (ref 13.0–17.0)
MCH: 28.6 pg (ref 26.0–34.0)
MCHC: 33.2 g/dL (ref 30.0–36.0)
MCV: 86.2 fL (ref 78.0–100.0)
Platelets: 182 10*3/uL (ref 150–400)
RBC: 5.52 MIL/uL (ref 4.22–5.81)
RDW: 14.1 % (ref 11.5–15.5)
WBC: 6.7 10*3/uL (ref 4.0–10.5)

## 2014-03-05 LAB — URINALYSIS, ROUTINE W REFLEX MICROSCOPIC
Bilirubin Urine: NEGATIVE
GLUCOSE, UA: NEGATIVE mg/dL
HGB URINE DIPSTICK: NEGATIVE
KETONES UR: NEGATIVE mg/dL
LEUKOCYTES UA: NEGATIVE
Nitrite: NEGATIVE
PH: 6 (ref 5.0–8.0)
Protein, ur: NEGATIVE mg/dL
Specific Gravity, Urine: 1.021 (ref 1.005–1.030)
Urobilinogen, UA: 0.2 mg/dL (ref 0.0–1.0)

## 2014-03-05 LAB — APTT: aPTT: 29 seconds (ref 24–37)

## 2014-03-05 NOTE — Patient Instructions (Addendum)
18 James Boone  03/05/2014   Your procedure is scheduled on: Monday October 26th, 2015  [facility]  Entrance and follow signs to  Short Stay Center at 830  AM.  Call this number if you have problems the morning of surgery [phone]   Remember:bring cpap mask and tubing  Do not eat food or drink liquids :After Midnight.     Take these medicines the morning of surgery with A SIP OF WATER: metoprolol, Lamictal, Sertraline, Bupropion, Atorvstatin                               You may not have any metal on your body including hair pins and piercings  Do not wear jewelry, make-up, lotions, powders, or deodorant.   Men may shave face and neck.  Do not bring valuables to the hospital. [facility] IS NOT RESPONSIBLE FOR VALUABLES.  Contacts, dentures or bridgework may not be worn into surgery.  Leave suitcase in the car. After surgery it may be brought to your room.  For patients admitted to the hospital, checkout time is 11:00 AM the day of discharge.   ________________________________________________________________________  [facility] - Preparing for Surgery Before surgery, you can play an important role.  Because skin is not sterile, your skin needs to be as free of germs as possible.  You can reduce the number of germs on your skin by washing with CHG (chlorahexidine gluconate) soap before surgery.  CHG is an antiseptic cleaner which kills germs and bonds with the skin to continue killing germs even after washing. Please DO NOT use if you have an allergy to CHG or antibacterial soaps.  If your skin becomes reddened/irritated stop using the CHG and inform your nurse when you arrive at Short Stay. Do not shave (including legs and underarms) for at least 48 hours prior to the first CHG shower.  You may shave your face/neck. Please follow these instructions carefully:  1.  Shower with CHG Soap the night before surgery and the  morning of Surgery.  2.  If  you choose to wash your hair, wash your hair first as usual with your  normal  shampoo.  3.  After you shampoo, rinse your hair and body thoroughly to remove the  shampoo.                           4.  Use CHG as you would any other liquid soap.  You can apply chg directly  to the skin and wash                       Gently with a scrungie or clean washcloth.  5.  Apply the CHG Soap to your body ONLY FROM THE NECK DOWN.   Do not use on face/ open                           Wound or open sores. Avoid contact with eyes, ears mouth and genitals (private parts).                       Wash face,  Genitals (private parts) with your normal soap.             6.  Wash thoroughly, paying special attention to the area  where your surgery  will be performed.  7.  Thoroughly rinse your body with warm water from the neck down.  8.  DO NOT shower/wash with your normal soap after using and rinsing off  the CHG Soap.                9.  Pat yourself dry with a clean towel.            10.  Wear clean pajamas.            11.  Place clean sheets on your bed the night of your first shower and do not  sleep with pets. Day of Surgery : Do not apply any lotions/deodorants the morning of surgery.  Please wear clean clothes to the hospital/surgery center.  FAILURE TO FOLLOW THESE INSTRUCTIONS MAY RESULT IN THE CANCELLATION OF YOUR SURGERY PATIENT SIGNATURE_________________________________  NURSE SIGNATURE__________________________________  ________________________________________________________________________   James MireIncentive Spirometer  An incentive spirometer is a tool that can help keep your lungs clear and active. This tool measures how well you are filling your lungs with each breath. Taking long deep breaths may help reverse or decrease the chance of developing breathing (pulmonary) problems (especially infection) following:  A long period of time when you are unable to move or be active. BEFORE THE PROCEDURE    If the spirometer includes an indicator to show your best effort, your nurse or respiratory therapist will set it to a desired goal.  If possible, sit up straight or lean slightly forward. Try not to slouch.  Hold the incentive spirometer in an upright position. INSTRUCTIONS FOR USE  1. Sit on the edge of your bed if possible, or sit up as far as you can in bed or on a chair. 2. Hold the incentive spirometer in an upright position. 3. Breathe out normally. 4. Place the mouthpiece in your mouth and seal your lips tightly around it. 5. Breathe in slowly and as deeply as possible, raising the piston or the ball toward the top of the column. 6. Hold your breath for 3-5 seconds or for as long as possible. Allow the piston or ball to fall to the bottom of the column. 7. Remove the mouthpiece from your mouth and breathe out normally. 8. Rest for a few seconds and repeat Steps 1 through 7 at least 10 times every 1-2 hours when you are awake. Take your time and take a few normal breaths between deep breaths. 9. The spirometer may include an indicator to show your best effort. Use the indicator as a goal to work toward during each repetition. 10. After each set of 10 deep breaths, practice coughing to be sure your lungs are clear. If you have an incision (the cut made at the time of surgery), support your incision when coughing by placing a pillow or rolled up towels firmly against it. Once you are able to get out of bed, walk around indoors and cough well. You may stop using the incentive spirometer when instructed by your caregiver.  RISKS AND COMPLICATIONS  Take your time so you do not get dizzy or light-headed.  If you are in pain, you may need to take or ask for pain medication before doing incentive spirometry. It is harder to take a deep breath if you are having pain. AFTER USE  Rest and breathe slowly and easily.  It can be helpful to keep track of a log of your progress. Your caregiver  can provide you with a simple  table to help with this. If you are using the spirometer at home, follow these instructions: [place] IF:   You are having difficultly using the spirometer.  You have trouble using the spirometer as often as instructed.  Your pain medication is not giving enough relief while using the spirometer.  You develop fever of 100.5 F (38.1 C) or higher. SEEK IMMEDIATE MEDICAL CARE IF:   You cough up bloody sputum that had not been present before.  You develop fever of 102 F (38.9 C) or greater.  You develop worsening pain at or near the incision site. MAKE SURE YOU:   Understand these instructions.  Will watch your condition.  Will get help right away if you are not doing well or get worse. Document Released: 09/18/2006 Document Revised: 07/31/2011 Document Reviewed: 11/19/2006 Exit[employer] Patient Information 2014 [employer], [place].   ________________________________________________________________________  WHAT IS A BLOOD TRANSFUSION? Blood Transfusion Information  A transfusion is the replacement of blood or some of its parts. Blood is made up of multiple cells which provide different functions.  Red blood cells carry oxygen and are used for blood loss replacement.  White blood cells fight against infection.  Platelets control bleeding.  Plasma helps clot blood.  Other blood products are available for specialized needs, such as hemophilia or other clotting disorders. BEFORE THE TRANSFUSION  Who gives blood for transfusions?   Healthy volunteers who are fully evaluated to make sure their blood is safe. This is blood bank blood. Transfusion therapy is the safest it has ever been in the practice of medicine. Before blood is taken from a donor, a complete history is taken to make sure that person has no history of diseases nor engages in risky social behavior (examples are intravenous drug use or sexual activity with multiple partners). The  donor's travel history is screened to minimize risk of transmitting infections, such as malaria. The donated blood is tested for signs of infectious diseases, such as HIV and hepatitis. The blood is then tested to be sure it is compatible with you in order to minimize the chance of a transfusion reaction. If you or a relative donates blood, this is often done in anticipation of surgery and is not appropriate for emergency situations. It takes many days to process the donated blood. RISKS AND COMPLICATIONS Although transfusion therapy is very safe and saves many lives, the main dangers of transfusion include:   Getting an infectious disease.  Developing a transfusion reaction. This is an allergic reaction to something in the blood you were given. Every precaution is taken to prevent this. The decision to have a blood transfusion has been considered carefully by your caregiver before blood is given. Blood is not given unless the benefits outweigh the risks. AFTER THE TRANSFUSION  Right after receiving a blood transfusion, you will usually feel much better and more energetic. This is especially true if your red blood cells have gotten low (anemic). The transfusion raises the level of the red blood cells which carry oxygen, and this usually causes an energy increase.  The nurse administering the transfusion will monitor you carefully for complications. HOME CARE INSTRUCTIONS  No special instructions are needed after a transfusion. You may find your energy is better. Speak with your caregiver about any limitations on activity for underlying diseases you may have. SEEK MEDICAL CARE IF:   Your condition is not improving after your transfusion.  You develop redness or irritation at the intravenous (IV) site. [place]  IF:  Any of the following symptoms occur over the next 12 hours:  Shaking chills.  You have a temperature by mouth above 102 F (38.9 C), not controlled by  medicine.  Chest, back, or muscle pain.  People around you feel you are not acting correctly or are confused.  Shortness of breath or difficulty breathing.  Dizziness and fainting.  You get a rash or develop hives.  You have a decrease in urine output.  Your urine turns a dark color or changes to pink, red, or brown. Any of the following symptoms occur over the next 10 days:  You have a temperature by mouth above 102 F (38.9 C), not controlled by medicine.  Shortness of breath.  Weakness after normal activity.  The white part of the eye turns yellow (jaundice).  You have a decrease in the amount of urine or are urinating less often.  Your urine turns a dark color or changes to pink, red, or brown. Document Released: 05/05/2000 Document Revised: 07/31/2011 Document Reviewed: 12/23/2007 [facility] Patient Information 2014 [place], [place].  _______________________________________________________________________

## 2014-03-15 ENCOUNTER — Ambulatory Visit: Payer: Self-pay | Admitting: Orthopedic Surgery

## 2014-03-15 MED ORDER — DEXTROSE 5 % IV SOLN
3.0000 g | INTRAVENOUS | Status: AC
  Administered 2014-03-16: 3 g via INTRAVENOUS
  Filled 2014-03-15: qty 3000

## 2014-03-15 NOTE — H&P (Signed)
Rush LandmarkIan C. Rolon DOB: 11/14/1960 Married / Language: [place] P[place] / Race: White Male Date of Admission: 03/16/2014 Chief Complaint:  Right Knee Pain History of Present Illness The patient is a 53 year old male who comes in for a preoperative history and physical. The patient is scheduled for a right total knee arthroplasty to be performed by Dr. Gus RankinFrank V. Aluisio, MD at [facility] on 03/16/2014. The patient is a 53 year old male who comes in today 1 year out from left total knee arthroplasty. The patient states that he is doing well at this time. The pain is under excellent control at this time and describe their pain as mild. They are currently on no medication for their pain. The patient is currently doing home exercise program. The patient feels that they are progressing well at this time. He is very pleased with how the left knee is doing. It does not limit his activity at all. His activity is limited due to his right knee. Symptoms with the right knee include: pain, aching and throbbing, while the patient does not report symptoms of: giving way (not at the knee but his ankle gives way). The patient feels that they are doing poorly and report their pain level to be moderate to severe (worsened in the past couple of weeks). The following medication has been used for pain control: none. He has had cortisone injections as well as Euflexxa in the right knee previously by Dr. Darrelyn HillockGioffre. No injections recently. He is at a point where the knee bothers him at all times and he would like to have the right knee replaced. He is ready to proceed with surgery for the right knee at this time. They have been treated conservatively in the past for the above stated problem and despite conservative measures, they continue to have progressive pain and severe functional limitations and dysfunction. They have failed non-operative management including home exercise, medications, and injections. It is felt that they would  benefit from undergoing total joint replacement. Risks and benefits of the procedure have been discussed with the patient and they elect to proceed with surgery. There are no active contraindications to surgery such as ongoing infection or rapidly progressive neurological disease.   Problem List/Past Medical  Primary osteoarthritis of right knee (715.16  M17.11) Status post total left knee replacement (V43.65  Z96.652) Sleep Apnea uses CPAP Impaired Vision Hemorrhoids High blood pressure Depression Hypercholesterolemia Peripheral Edema Varicose veins Umbilical Hernia  Allergies No Known Drug Allergies  Family History Depression sister Heart Disease father Heart disease in male family member before age 55 Cerebrovascular Accident First Degree Relatives. father  Social History  Pain Contract no Tobacco / smoke exposure no Drug/Alcohol Rehab (Previously) no Illicit drug use no Children 2 Alcohol use current drinker; drinks beer and wine; only occasionally per week Tobacco use Never smoker. never smoker Current work status working part [employer] Exercise Exercises daily; does running / walking Living situation live with spouse Drug/Alcohol Rehab (Currently) no Marital status married Post-Surgical Plans Home  Medication History  Meloxicam (15MG  Tablet, 1 Oral daily, Taken starting 12/22/2013) Active. (last time till labs done) BuPROPion HCl ER (XL) (150MG  Tablet ER 24HR, Oral) Active. Metoprolol Tartrate (50MG  Tablet, Oral) Active. Atorvastatin Calcium (10MG  Tablet, Oral) Active. Amphetamine Salt Combo (25MG  Capsule ER 24HR, Oral) Active. LamoTRIgine ER (200MG  Tablet ER 24HR, Oral) Active. Aspirin EC (81MG  Tablet DR, Oral) Active. Vitamin C (Oral) Active. Super B Complex (Oral) Active. Fish Oil + D3 (1200-1000MG -UNIT Capsule, Oral) Active.  Glucosamine-Chondroitin DS (Oral) Active. Multivitamin (Oral) Active. Sertraline HCl (100MG  Tablet, Oral) Active.  Past Surgical History Arthroscopy of Knee bilateral; Right - 2008, Left - 2010 Wisdom Teeth Extraction Abdominal Hernia Repair Date: 2001. Vasectomy Date: 1990. Umbilical Hernia Repair Total Knee Replacement - Left Date: 12/2012.   Review of Systems General Not Present- Chills, Fatigue, Fever, Memory Loss, Night Sweats, Weight Gain and Weight Loss. Skin Not Present- Eczema, Hives, Itching, Lesions and Rash. HEENT Not Present- Dentures, Double Vision, Headache, Hearing Loss, Tinnitus and Visual Loss. Respiratory Not Present- Allergies, Chronic Cough, Coughing up blood, Shortness of breath at rest and Shortness of breath with exertion. Cardiovascular Not Present- Chest Pain, Difficulty Breathing Lying Down, Murmur, Palpitations, Racing/skipping heartbeats and Swelling. Gastrointestinal Not Present- Abdominal Pain, Bloody Stool, Constipation, Diarrhea, Difficulty Swallowing, Heartburn, Jaundice, Loss of appetitie, Nausea and Vomiting. Male Genitourinary Not Present- Blood in Urine, Discharge, Flank Pain, Incontinence, Painful Urination, Urgency, Urinary frequency, Urinary Retention, Urinating at Night and Weak urinary stream. Musculoskeletal Present- Back Pain and Joint Swelling. Not Present- Morning Stiffness, Muscle Pain, Muscle Weakness and Spasms. Neurological Not Present- Blackout spells, Difficulty with balance, Dizziness, Paralysis, Tremor and Weakness. Psychiatric Not Present- Insomnia.  Vitals Weight: 290 lb Height: 76in Weight was reported by patient. Height was reported by patient. Body Surface Area: 2.66 m Body Mass Index: 35.3 kg/m  Pulse: 64 (Regular)  BP: 130/82 (Sitting, Right Arm, Standard)   Physical Exam  General Mental Status -Alert, cooperative and good historian. General Appearance-pleasant, Not in acute distress. Orientation-Oriented X3. Build & Nutrition-Well nourished and Well developed.  Head and  Neck Head-normocephalic, atraumatic . Neck Global Assessment - supple, no bruit auscultated on the right, no bruit auscultated on the left.  Eye Vision-Wears corrective lenses. Pupil - Bilateral-Regular and Round. Motion - Bilateral-EOMI.  Chest and Lung Exam Auscultation Breath sounds - clear at anterior chest wall and clear at posterior chest wall. Adventitious sounds - No Adventitious sounds.  Cardiovascular Auscultation Rhythm - Regular rate and rhythm. Heart Sounds - S1 WNL and S2 WNL. Murmurs & Other Heart Sounds: Murmur 1 - Location - Aortic Area. Timing - Early systolic. Grade - II/VI(faint). Character - Low pitched.  Abdomen Inspection Contour - Generalized mild distention. Palpation/Percussion Tenderness - Abdomen is non-tender to palpation. Rigidity (guarding) - Abdomen is soft. Auscultation Auscultation of the abdomen reveals - Bowel sounds normal.  Male Genitourinary Note: Not done, not pertinent to present illness   Musculoskeletal Note: He is a well developed male in no distress. His left knee shows no swelling. His range of motion on the left is 0-125 with no tenderness or instability. The right knee has a varus deformity. Range is 5-115. There is marked crepitus on range of motion. She is tender medial greater than lateral with no instability noted.  RADIOGRAPHS: AP of both knees and lateral show the prosthesis on the left to be in excellent position. No periprosthetic abnormalities. On the right he is bone on bone medial and patellofemoral with a varus deformity.   Assessment & Plan Primary osteoarthritis of right knee (715.16  M17.11) Note:Plan is for a Right Total Knee Replacement by Dr. Aluisio.  Plan is to go home.  PCP - Dr. Paz - Patient has been seen preoperatively and felt to be stable for surgery.  The patient does not have any contraindications and will receive TXA (tranexamic acid) prior to surgery.  Signed electronically by  Alexzandrew L Perkins, III PA-C 

## 2014-03-16 ENCOUNTER — Encounter (HOSPITAL_COMMUNITY): Payer: 59 | Admitting: Anesthesiology

## 2014-03-16 ENCOUNTER — Encounter (HOSPITAL_COMMUNITY): Payer: Self-pay | Admitting: *Deleted

## 2014-03-16 ENCOUNTER — Encounter (HOSPITAL_COMMUNITY)
Admission: RE | Disposition: A | Discharge status: Home Health | Payer: Self-pay | Source: Ambulatory Visit | Attending: Orthopedic Surgery

## 2014-03-16 ENCOUNTER — Inpatient Hospital Stay (HOSPITAL_COMMUNITY): Payer: 59 | Admitting: Anesthesiology

## 2014-03-16 ENCOUNTER — Inpatient Hospital Stay (HOSPITAL_COMMUNITY)
Admission: RE | Admit: 2014-03-16 | Discharge: 2014-03-18 | DRG: 470 | Disposition: A | Discharge status: Home Health | Payer: 59 | Source: Ambulatory Visit | Attending: Orthopedic Surgery | Admitting: Orthopedic Surgery

## 2014-03-16 DIAGNOSIS — H547 Unspecified visual loss: Secondary | ICD-10-CM | POA: Diagnosis present

## 2014-03-16 DIAGNOSIS — M1711 Unilateral primary osteoarthritis, right knee: Secondary | ICD-10-CM | POA: Diagnosis present

## 2014-03-16 DIAGNOSIS — I10 Essential (primary) hypertension: Secondary | ICD-10-CM | POA: Diagnosis present

## 2014-03-16 DIAGNOSIS — E785 Hyperlipidemia, unspecified: Secondary | ICD-10-CM | POA: Diagnosis present

## 2014-03-16 DIAGNOSIS — Z7982 Long term (current) use of aspirin: Secondary | ICD-10-CM | POA: Diagnosis not present

## 2014-03-16 DIAGNOSIS — F329 Major depressive disorder, single episode, unspecified: Secondary | ICD-10-CM | POA: Diagnosis present

## 2014-03-16 DIAGNOSIS — G4730 Sleep apnea, unspecified: Secondary | ICD-10-CM | POA: Diagnosis present

## 2014-03-16 DIAGNOSIS — Z6835 Body mass index (BMI) 35.0-35.9, adult: Secondary | ICD-10-CM | POA: Diagnosis not present

## 2014-03-16 DIAGNOSIS — M25561 Pain in right knee: Secondary | ICD-10-CM | POA: Diagnosis present

## 2014-03-16 DIAGNOSIS — Z96652 Presence of left artificial knee joint: Secondary | ICD-10-CM | POA: Diagnosis present

## 2014-03-16 DIAGNOSIS — E780 Pure hypercholesterolemia: Secondary | ICD-10-CM | POA: Diagnosis present

## 2014-03-16 DIAGNOSIS — Z6836 Body mass index (BMI) 36.0-36.9, adult: Secondary | ICD-10-CM

## 2014-03-16 HISTORY — PX: TOTAL KNEE ARTHROPLASTY: SHX125

## 2014-03-16 LAB — TYPE AND SCREEN
ABO/RH(D): O POS
Antibody Screen: NEGATIVE

## 2014-03-16 SURGERY — ARTHROPLASTY, KNEE, TOTAL
Anesthesia: Spinal | Site: Knee | Laterality: Right

## 2014-03-16 MED ORDER — PROMETHAZINE HCL 25 MG/ML IJ SOLN: 6.2500 mg | INTRAMUSCULAR | Status: DC | PRN

## 2014-03-16 MED ORDER — PROPOFOL 10 MG/ML IV BOLUS
INTRAVENOUS | Status: AC
  Filled 2014-03-16: qty 20

## 2014-03-16 MED ORDER — METHOCARBAMOL 500 MG PO TABS
500.0000 mg | ORAL_TABLET | Freq: Four times a day (QID) | ORAL | Status: DC | PRN
  Administered 2014-03-16 – 2014-03-18 (×6): 500 mg via ORAL
  Filled 2014-03-16 (×6): qty 1

## 2014-03-16 MED ORDER — PHENOL 1.4 % MT LIQD
1.0000 | OROMUCOSAL | Status: DC | PRN
  Filled 2014-03-16: qty 177

## 2014-03-16 MED ORDER — TRANEXAMIC ACID 100 MG/ML IV SOLN
1000.0000 mg | INTRAVENOUS | Status: AC
  Administered 2014-03-16: 1000 mg via INTRAVENOUS
  Filled 2014-03-16: qty 10

## 2014-03-16 MED ORDER — ACETAMINOPHEN 500 MG PO TABS
1000.0000 mg | ORAL_TABLET | Freq: Four times a day (QID) | ORAL | Status: AC
  Administered 2014-03-16 – 2014-03-17 (×3): 1000 mg via ORAL
  Filled 2014-03-16 (×4): qty 2

## 2014-03-16 MED ORDER — DEXAMETHASONE SODIUM PHOSPHATE 10 MG/ML IJ SOLN
INTRAMUSCULAR | Status: DC | PRN
  Administered 2014-03-16: 10 mg via INTRAVENOUS

## 2014-03-16 MED ORDER — MIDAZOLAM HCL 5 MG/5ML IJ SOLN
INTRAMUSCULAR | Status: DC | PRN
  Administered 2014-03-16: 2 mg via INTRAVENOUS

## 2014-03-16 MED ORDER — MENTHOL 3 MG MT LOZG
1.0000 | LOZENGE | OROMUCOSAL | Status: DC | PRN
  Filled 2014-03-16: qty 9

## 2014-03-16 MED ORDER — SODIUM CHLORIDE 0.9 % IJ SOLN
INTRAMUSCULAR | Status: AC
  Filled 2014-03-16: qty 50

## 2014-03-16 MED ORDER — AMPHETAMINE-DEXTROAMPHET ER 10 MG PO CP24
50.0000 mg | ORAL_CAPSULE | Freq: Every morning | ORAL | Status: DC
Start: 2014-03-16 — End: 2014-03-18
  Administered 2014-03-17 – 2014-03-18 (×2): 50 mg via ORAL
  Filled 2014-03-16 (×3): qty 5

## 2014-03-16 MED ORDER — DEXAMETHASONE SODIUM PHOSPHATE 10 MG/ML IJ SOLN
INTRAMUSCULAR | Status: AC
  Filled 2014-03-16: qty 1

## 2014-03-16 MED ORDER — OXYCODONE HCL 5 MG PO TABS
5.0000 mg | ORAL_TABLET | ORAL | Status: DC | PRN
  Administered 2014-03-16: 10 mg via ORAL
  Administered 2014-03-16: 5 mg via ORAL
  Administered 2014-03-17 – 2014-03-18 (×9): 10 mg via ORAL
  Filled 2014-03-16 (×4): qty 2
  Filled 2014-03-16: qty 1
  Filled 2014-03-16 (×6): qty 2

## 2014-03-16 MED ORDER — FENTANYL CITRATE 0.05 MG/ML IJ SOLN
INTRAMUSCULAR | Status: AC
  Filled 2014-03-16: qty 2

## 2014-03-16 MED ORDER — BISACODYL 10 MG RE SUPP: 10.0000 mg | Freq: Every day | RECTAL | Status: DC | PRN

## 2014-03-16 MED ORDER — RIVAROXABAN 10 MG PO TABS
10.0000 mg | ORAL_TABLET | Freq: Every day | ORAL | Status: DC
  Administered 2014-03-17 – 2014-03-18 (×2): 10 mg via ORAL
  Filled 2014-03-16 (×3): qty 1

## 2014-03-16 MED ORDER — DEXAMETHASONE SODIUM PHOSPHATE 10 MG/ML IJ SOLN: 10.0000 mg | Freq: Once | INTRAMUSCULAR | Status: DC

## 2014-03-16 MED ORDER — BUPIVACAINE IN DEXTROSE 0.75-8.25 % IT SOLN
INTRATHECAL | Status: DC | PRN
  Administered 2014-03-16: 2 mL via INTRATHECAL

## 2014-03-16 MED ORDER — ONDANSETRON HCL 4 MG/2ML IJ SOLN: 4.0000 mg | Freq: Four times a day (QID) | INTRAMUSCULAR | Status: DC | PRN

## 2014-03-16 MED ORDER — FLEET ENEMA 7-19 GM/118ML RE ENEM: 1.0000 | ENEMA | Freq: Once | RECTAL | Status: AC | PRN

## 2014-03-16 MED ORDER — MIDAZOLAM HCL 2 MG/2ML IJ SOLN
INTRAMUSCULAR | Status: AC
  Filled 2014-03-16: qty 2

## 2014-03-16 MED ORDER — BUPROPION HCL ER (XL) 150 MG PO TB24
150.0000 mg | ORAL_TABLET | Freq: Every morning | ORAL | Status: DC
  Administered 2014-03-17 – 2014-03-18 (×2): 150 mg via ORAL
  Filled 2014-03-16 (×2): qty 1

## 2014-03-16 MED ORDER — TRAMADOL HCL 50 MG PO TABS: 50.0000 mg | ORAL_TABLET | Freq: Four times a day (QID) | ORAL | Status: DC | PRN

## 2014-03-16 MED ORDER — BUPIVACAINE LIPOSOME 1.3 % IJ SUSP
INTRAMUSCULAR | Status: DC | PRN
  Administered 2014-03-16: 20 mL

## 2014-03-16 MED ORDER — ACETAMINOPHEN 650 MG RE SUPP: 650.0000 mg | Freq: Four times a day (QID) | RECTAL | Status: DC | PRN

## 2014-03-16 MED ORDER — ONDANSETRON HCL 4 MG PO TABS: 4.0000 mg | ORAL_TABLET | Freq: Four times a day (QID) | ORAL | Status: DC | PRN

## 2014-03-16 MED ORDER — FENTANYL CITRATE 0.05 MG/ML IJ SOLN
INTRAMUSCULAR | Status: DC | PRN
  Administered 2014-03-16: 25 ug via INTRAVENOUS

## 2014-03-16 MED ORDER — OXYCODONE HCL 5 MG/5ML PO SOLN: 5.0000 mg | Freq: Once | ORAL | Status: DC | PRN

## 2014-03-16 MED ORDER — BUPIVACAINE HCL (PF) 0.25 % IJ SOLN
INTRAMUSCULAR | Status: AC
  Filled 2014-03-16: qty 30

## 2014-03-16 MED ORDER — BUPIVACAINE HCL 0.25 % IJ SOLN
INTRAMUSCULAR | Status: DC | PRN
  Administered 2014-03-16: 20 mL

## 2014-03-16 MED ORDER — CEFAZOLIN SODIUM-DEXTROSE 2-3 GM-% IV SOLR
2.0000 g | Freq: Four times a day (QID) | INTRAVENOUS | Status: AC
  Administered 2014-03-16 – 2014-03-17 (×2): 2 g via INTRAVENOUS
  Filled 2014-03-16 (×2): qty 50

## 2014-03-16 MED ORDER — LAMOTRIGINE 150 MG PO TABS
150.0000 mg | ORAL_TABLET | Freq: Every evening | ORAL | Status: DC
  Administered 2014-03-16 – 2014-03-17 (×2): 150 mg via ORAL
  Filled 2014-03-16 (×3): qty 1

## 2014-03-16 MED ORDER — METHOCARBAMOL 1000 MG/10ML IJ SOLN
500.0000 mg | Freq: Four times a day (QID) | INTRAVENOUS | Status: DC | PRN
  Filled 2014-03-16: qty 5

## 2014-03-16 MED ORDER — MORPHINE SULFATE 2 MG/ML IJ SOLN: 1.0000 mg | INTRAMUSCULAR | Status: DC | PRN

## 2014-03-16 MED ORDER — ATORVASTATIN CALCIUM 10 MG PO TABS
10.0000 mg | ORAL_TABLET | Freq: Every morning | ORAL | Status: DC
  Administered 2014-03-17 – 2014-03-18 (×2): 10 mg via ORAL
  Filled 2014-03-16 (×2): qty 1

## 2014-03-16 MED ORDER — DOCUSATE SODIUM 100 MG PO CAPS
100.0000 mg | ORAL_CAPSULE | Freq: Two times a day (BID) | ORAL | Status: DC
  Administered 2014-03-16 – 2014-03-18 (×5): 100 mg via ORAL

## 2014-03-16 MED ORDER — PROPOFOL 10 MG/ML IV BOLUS
INTRAVENOUS | Status: DC | PRN
  Administered 2014-03-16: 20 mg via INTRAVENOUS
  Administered 2014-03-16: 30 mg via INTRAVENOUS

## 2014-03-16 MED ORDER — ACETAMINOPHEN 10 MG/ML IV SOLN
1000.0000 mg | Freq: Once | INTRAVENOUS | Status: DC
  Filled 2014-03-16: qty 100

## 2014-03-16 MED ORDER — ACETAMINOPHEN 325 MG PO TABS: 650.0000 mg | ORAL_TABLET | Freq: Four times a day (QID) | ORAL | Status: DC | PRN

## 2014-03-16 MED ORDER — BUPIVACAINE LIPOSOME 1.3 % IJ SUSP
20.0000 mL | Freq: Once | INTRAMUSCULAR | Status: DC
  Filled 2014-03-16: qty 20

## 2014-03-16 MED ORDER — SERTRALINE HCL 100 MG PO TABS
200.0000 mg | ORAL_TABLET | Freq: Every morning | ORAL | Status: DC
  Administered 2014-03-17 – 2014-03-18 (×2): 200 mg via ORAL
  Filled 2014-03-16 (×2): qty 2

## 2014-03-16 MED ORDER — LACTATED RINGERS IV SOLN
INTRAVENOUS | Status: DC
  Administered 2014-03-16 (×2): via INTRAVENOUS
  Administered 2014-03-16: 1000 mL via INTRAVENOUS
  Administered 2014-03-16: 13:00:00 via INTRAVENOUS

## 2014-03-16 MED ORDER — MEPERIDINE HCL 50 MG/ML IJ SOLN: 6.2500 mg | INTRAMUSCULAR | Status: DC | PRN

## 2014-03-16 MED ORDER — METOPROLOL TARTRATE 50 MG PO TABS
50.0000 mg | ORAL_TABLET | Freq: Two times a day (BID) | ORAL | Status: DC
  Administered 2014-03-16 – 2014-03-18 (×4): 50 mg via ORAL
  Filled 2014-03-16 (×5): qty 1

## 2014-03-16 MED ORDER — DIPHENHYDRAMINE HCL 12.5 MG/5ML PO ELIX: 12.5000 mg | ORAL_SOLUTION | ORAL | Status: DC | PRN

## 2014-03-16 MED ORDER — PROPOFOL INFUSION 10 MG/ML OPTIME
INTRAVENOUS | Status: DC | PRN
  Administered 2014-03-16: 75 ug/kg/min via INTRAVENOUS

## 2014-03-16 MED ORDER — POLYETHYLENE GLYCOL 3350 17 G PO PACK: 17.0000 g | PACK | Freq: Every day | ORAL | Status: DC | PRN

## 2014-03-16 MED ORDER — KETOROLAC TROMETHAMINE 15 MG/ML IJ SOLN: 7.5000 mg | Freq: Four times a day (QID) | INTRAMUSCULAR | Status: AC | PRN

## 2014-03-16 MED ORDER — DEXAMETHASONE SODIUM PHOSPHATE 10 MG/ML IJ SOLN
10.0000 mg | Freq: Once | INTRAMUSCULAR | Status: AC
  Administered 2014-03-17: 10 mg via INTRAVENOUS
  Filled 2014-03-16: qty 1

## 2014-03-16 MED ORDER — DEXTROSE-NACL 5-0.9 % IV SOLN
INTRAVENOUS | Status: DC
Start: 2014-03-16 — End: 2014-03-18
  Administered 2014-03-16 – 2014-03-17 (×3): via INTRAVENOUS

## 2014-03-16 MED ORDER — METOCLOPRAMIDE HCL 5 MG/ML IJ SOLN: 5.0000 mg | Freq: Three times a day (TID) | INTRAMUSCULAR | Status: DC | PRN

## 2014-03-16 MED ORDER — ACETAMINOPHEN 10 MG/ML IV SOLN
INTRAVENOUS | Status: DC | PRN
Start: 2014-03-16 — End: 2014-03-16
  Administered 2014-03-16: 1000 mg via INTRAVENOUS

## 2014-03-16 MED ORDER — HYDROMORPHONE HCL 1 MG/ML IJ SOLN: 0.2500 mg | INTRAMUSCULAR | Status: DC | PRN

## 2014-03-16 MED ORDER — SODIUM CHLORIDE 0.9 % IJ SOLN
INTRAMUSCULAR | Status: DC | PRN
  Administered 2014-03-16: 30 mL

## 2014-03-16 MED ORDER — SODIUM CHLORIDE 0.9 % IV SOLN: INTRAVENOUS | Status: DC

## 2014-03-16 MED ORDER — METOCLOPRAMIDE HCL 10 MG PO TABS: 5.0000 mg | ORAL_TABLET | Freq: Three times a day (TID) | ORAL | Status: DC | PRN

## 2014-03-16 MED ORDER — OXYCODONE HCL 5 MG PO TABS: 5.0000 mg | ORAL_TABLET | Freq: Once | ORAL | Status: DC | PRN

## 2014-03-16 SURGICAL SUPPLY — 63 items
BAG SPEC THK2 15X12 ZIP CLS (MISCELLANEOUS) ×1
BAG ZIPLOCK 12X15 (MISCELLANEOUS) ×3 IMPLANT
BANDAGE ELASTIC 6 VELCRO ST LF (GAUZE/BANDAGES/DRESSINGS) ×3 IMPLANT
BANDAGE ESMARK 6X9 LF (GAUZE/BANDAGES/DRESSINGS) ×1 IMPLANT
BLADE SAG 18X100X1.27 (BLADE) ×3 IMPLANT
BLADE SAW SGTL 11.0X1.19X90.0M (BLADE) ×3 IMPLANT
BNDG CMPR 9X6 STRL LF SNTH (GAUZE/BANDAGES/DRESSINGS) ×1
BNDG ESMARK 6X9 LF (GAUZE/BANDAGES/DRESSINGS) ×3
BOWL SMART MIX CTS (DISPOSABLE) ×3 IMPLANT
CAPT RP KNEE ×2 IMPLANT
CEMENT HV SMART SET (Cement) ×6 IMPLANT
CLOSURE WOUND 1/2 X4 (GAUZE/BANDAGES/DRESSINGS) ×1
CUFF TOURN SGL QUICK 34 (TOURNIQUET CUFF) ×3
CUFF TRNQT CYL 34X4X40X1 (TOURNIQUET CUFF) ×1 IMPLANT
DECANTER SPIKE VIAL GLASS SM (MISCELLANEOUS) ×3 IMPLANT
DRAPE EXTREMITY TIBURON (DRAPES) ×3 IMPLANT
DRAPE POUCH INSTRU U-SHP 10X18 (DRAPES) ×3 IMPLANT
DRAPE U-SHAPE 47X51 STRL (DRAPES) ×3 IMPLANT
DRSG ADAPTIC 3X8 NADH LF (GAUZE/BANDAGES/DRESSINGS) ×3 IMPLANT
DRSG PAD ABDOMINAL 8X10 ST (GAUZE/BANDAGES/DRESSINGS) ×1 IMPLANT
DURAPREP 26ML APPLICATOR (WOUND CARE) ×3 IMPLANT
ELECT REM PT RETURN 9FT ADLT (ELECTROSURGICAL) ×3
ELECTRODE REM PT RTRN 9FT ADLT (ELECTROSURGICAL) ×1 IMPLANT
EVACUATOR 1/8 PVC DRAIN (DRAIN) ×3 IMPLANT
FACESHIELD WRAPAROUND (MASK) ×15 IMPLANT
FACESHIELD WRAPAROUND OR TEAM (MASK) ×5 IMPLANT
GAUZE SPONGE 4X4 12PLY STRL (GAUZE/BANDAGES/DRESSINGS) ×3 IMPLANT
GLOVE BIO SURGEON STRL SZ7.5 (GLOVE) IMPLANT
GLOVE BIO SURGEON STRL SZ8 (GLOVE) ×3 IMPLANT
GLOVE BIOGEL PI IND STRL 6.5 (GLOVE) IMPLANT
GLOVE BIOGEL PI IND STRL 8 (GLOVE) ×1 IMPLANT
GLOVE BIOGEL PI INDICATOR 6.5 (GLOVE)
GLOVE BIOGEL PI INDICATOR 8 (GLOVE) ×2
GLOVE SURG SS PI 6.5 STRL IVOR (GLOVE) IMPLANT
GOWN STRL REUS W/TWL LRG LVL3 (GOWN DISPOSABLE) ×3 IMPLANT
GOWN STRL REUS W/TWL XL LVL3 (GOWN DISPOSABLE) IMPLANT
HANDPIECE INTERPULSE COAX TIP (DISPOSABLE) ×3
IMMOBILIZER KNEE 20 (SOFTGOODS) ×2 IMPLANT
IMMOBILIZER KNEE 20 THIGH 36 (SOFTGOODS) ×1 IMPLANT
KIT BASIN OR (CUSTOM PROCEDURE TRAY) ×3 IMPLANT
MANIFOLD NEPTUNE II (INSTRUMENTS) ×3 IMPLANT
NDL SAFETY ECLIPSE 18X1.5 (NEEDLE) ×2 IMPLANT
NEEDLE HYPO 18GX1.5 SHARP (NEEDLE) ×6
NS IRRIG 1000ML POUR BTL (IV SOLUTION) ×3 IMPLANT
PACK TOTAL JOINT (CUSTOM PROCEDURE TRAY) ×3 IMPLANT
PAD ABD 8X10 STRL (GAUZE/BANDAGES/DRESSINGS) ×2 IMPLANT
PADDING CAST COTTON 6X4 STRL (CAST SUPPLIES) ×4 IMPLANT
POSITIONER SURGICAL ARM (MISCELLANEOUS) ×3 IMPLANT
SET HNDPC FAN SPRY TIP SCT (DISPOSABLE) ×1 IMPLANT
STRIP CLOSURE SKIN 1/2X4 (GAUZE/BANDAGES/DRESSINGS) ×3 IMPLANT
SUCTION FRAZIER 12FR DISP (SUCTIONS) ×3 IMPLANT
SUT MNCRL AB 4-0 PS2 18 (SUTURE) ×3 IMPLANT
SUT VIC AB 2-0 CT1 27 (SUTURE) ×12
SUT VIC AB 2-0 CT1 TAPERPNT 27 (SUTURE) ×3 IMPLANT
SUT VLOC 180 0 24IN GS25 (SUTURE) ×3 IMPLANT
SYR 20CC LL (SYRINGE) ×3 IMPLANT
SYR 50ML LL SCALE MARK (SYRINGE) ×3 IMPLANT
TOWEL OR 17X26 10 PK STRL BLUE (TOWEL DISPOSABLE) ×3 IMPLANT
TOWEL OR NON WOVEN STRL DISP B (DISPOSABLE) IMPLANT
TRAY FOLEY CATH 14FRSI W/METER (CATHETERS) ×3 IMPLANT
TRAY FOLEY CATH 16FRSI W/METER (SET/KITS/TRAYS/PACK) IMPLANT
WATER STERILE IRR 1500ML POUR (IV SOLUTION) ×3 IMPLANT
WRAP KNEE MAXI GEL POST OP (GAUZE/BANDAGES/DRESSINGS) ×3 IMPLANT

## 2014-03-16 NOTE — Interval H&P Note (Signed)
History and Physical Interval Note:  03/16/2014 9:41 AM  James Boone  has presented today for surgery, with the diagnosis of right knee osteoarthritis  The various methods of treatment have been discussed with the patient and family. After consideration of risks, benefits and other options for treatment, the patient has consented to  Procedure(s): RIGHT TOTAL KNEE ARTHROPLASTY (Right) as a surgical intervention .  The patient's history has been reviewed, patient examined, no change in status, stable for surgery.  I have reviewed the patient's chart and labs.  Questions were answered to the patient's satisfaction.     Loanne DrillingALUISIO,Devoiry Corriher V

## 2014-03-16 NOTE — Anesthesia Preprocedure Evaluation (Signed)
Anesthesia Evaluation  Patient identified by MRN, date of birth, ID band Patient awake    Reviewed: Allergy & Precautions, H&P , NPO status , Patient's Chart, lab work & pertinent test results, reviewed documented beta blocker date and time   Airway Mallampati: III  TM Distance: <3 FB Neck ROM: Full    Dental no notable dental hx.    Pulmonary sleep apnea ,  breath sounds clear to auscultation  + decreased breath sounds      Cardiovascular hypertension, Pt. on medications and Pt. on home beta blockers Rhythm:Regular Rate:Normal     Neuro/Psych PSYCHIATRIC DISORDERS Depression negative neurological ROS     GI/Hepatic negative GI ROS, Neg liver ROS,   Endo/Other  Morbid obesity  Renal/GU negative Renal ROS     Musculoskeletal  (+) Arthritis -,   Abdominal   Peds  Hematology negative hematology ROS (+)   Anesthesia Other Findings   Reproductive/Obstetrics negative OB ROS                             Anesthesia Physical  Anesthesia Plan  ASA: III  Anesthesia Plan: Spinal   Post-op Pain Management:    Induction:   Airway Management Planned: Nasal Cannula  Additional Equipment:   Intra-op Plan:   Post-operative Plan:   Informed Consent: I have reviewed the patients History and Physical, chart, labs and discussed the procedure including the risks, benefits and alternatives for the proposed anesthesia with the patient or authorized representative who has indicated his/her understanding and acceptance.     Plan Discussed with: CRNA and Surgeon  Anesthesia Plan Comments:         Anesthesia Quick Evaluation

## 2014-03-16 NOTE — Op Note (Signed)
Pre-operative diagnosis- Osteoarthritis  Right knee(s)  Post-operative diagnosis- Osteoarthritis Right knee(s)  Procedure-  Right  Total Knee Arthroplasty  Surgeon- James RankinFrank V. Jillien Yakel, MD  Assistant- Dimitri PedAmber Constable, PA-C   Anesthesia-  Spinal  EBL-* No blood loss amount entered *   Drains Hemovac  Tourniquet time-  Total Tourniquet Time Documented: Thigh (Right) - 44 minutes Total: Thigh (Right) - 44 minutes     Complications- None  Condition-PACU - hemodynamically stable.   Brief Clinical Note  James Boone is a 53 y.o. year old male with end stage OA of his right knee with progressively worsening pain and dysfunction. He has constant pain, with activity and at rest and significant functional deficits with difficulties even with ADLs. He has had extensive non-op management including analgesics, injections of cortisone, and home exercise program, but remains in significant pain with significant dysfunction. He presents now for right Total Knee Arthroplasty.    Procedure in detail---   The patient is brought into the operating room and positioned supine on the operating table. After successful administration of  Spinal,   a tourniquet is placed high on the  Right thigh(s) and the lower extremity is prepped and draped in the usual sterile fashion. Time out is performed by the operating team and then the  Right lower extremity is wrapped in Esmarch, knee flexed and the tourniquet inflated to 300 mmHg.       A midline incision is made with a ten blade through the subcutaneous tissue to the level of the extensor mechanism. A fresh blade is used to make a medial parapatellar arthrotomy. Soft tissue over the proximal medial tibia is subperiosteally elevated to the joint line with a knife and into the semimembranosus bursa with a Cobb elevator. Soft tissue over the proximal lateral tibia is elevated with attention being paid to avoiding the patellar tendon on the tibial tubercle. The patella is  everted, knee flexed 90 degrees and the ACL and PCL are removed. Findings are bone on bone medial and patellofemoral with large global osteophytes.        The drill is used to create a starting hole in the distal femur and the canal is thoroughly irrigated with sterile saline to remove the fatty contents. The 5 degree Right  valgus alignment guide is placed into the femoral canal and the distal femoral cutting block is pinned to remove 10 mm off the distal femur. Resection is made with an oscillating saw.      The tibia is subluxed forward and the menisci are removed. The extramedullary alignment guide is placed referencing proximally at the medial aspect of the tibial tubercle and distally along the second metatarsal axis and tibial crest. The block is pinned to remove 2mm off the more deficient medial  side. Resection is made with an oscillating saw. Size 6is the most appropriate size for the tibia and the proximal tibia is prepared with the modular drill and keel punch for that size.      The femoral sizing guide is placed and size 6 is most appropriate. Rotation is marked off the epicondylar axis and confirmed by creating a rectangular flexion gap at 90 degrees. The size 6 cutting block is pinned in this rotation and the anterior, posterior and chamfer cuts are made with the oscillating saw. The intercondylar block is then placed and that cut is made.      Trial size 6 tibial component, trial size 6 posterior stabilized femur and a 10  mm  posterior stabilized rotating platform insert trial is placed. Full extension is achieved with excellent varus/valgus and anterior/posterior balance throughout full range of motion. The patella is everted and thickness measured to be 27  mm. Free hand resection is taken to 15 mm, a 41 template is placed, lug holes are drilled, trial patella is placed, and it tracks normally. Osteophytes are removed off the posterior femur with the trial in place. All trials are removed and  the cut bone surfaces prepared with pulsatile lavage. Cement is mixed and once ready for implantation, the size 6 tibial implant, size  6 posterior stabilized femoral component, and the size 41 patella are cemented in place and the patella is held with the clamp. The trial insert is placed and the knee held in full extension. The Exparel (20 ml mixed with 30 ml saline) and .25% Bupivicaine, are injected into the extensor mechanism, posterior capsule, medial and lateral gutters and subcutaneous tissues.  All extruded cement is removed and once the cement is hard the permanent 10 mm posterior stabilized rotating platform insert is placed into the tibial tray.      The wound is copiously irrigated with saline solution and the extensor mechanism closed over a hemovac drain with #1 V-loc suture. The tourniquet is released for a total tourniquet time of 44  minutes. Flexion against gravity is 135 degrees and the patella tracks normally. Subcutaneous tissue is closed with 2.0 vicryl and subcuticular with running 4.0 Monocryl. The incision is cleaned and dried and steri-strips and a bulky sterile dressing are applied. The limb is placed into a knee immobilizer and the patient is awakened and transported to recovery in stable condition.      Please note that a surgical assistant was a medical necessity for this procedure in order to perform it in a safe and expeditious manner. Surgical assistant was necessary to retract the ligaments and vital neurovascular structures to prevent injury to them and also necessary for proper positioning of the limb to allow for anatomic placement of the prosthesis.   James RankinFrank V. Caydence Koenig, MD    03/16/2014, 12:43 PM

## 2014-03-16 NOTE — H&P (View-Only) (Signed)
James Boone DOB: 11/14/1960 Married / Language: [place] / Race: White Male Date of Admission: 03/16/2014 Chief Complaint:  Right Knee Pain History of Present Illness The patient is a 53 year old male who comes in for a preoperative history and physical. The patient is scheduled for a right total knee arthroplasty to be performed by Dr. Gus RankinFrank V. Aluisio, MD at [facility] on 03/16/2014. The patient is a 53 year old male who comes in today 1 year out from left total knee arthroplasty. The patient states that he is doing well at this time. The pain is under excellent control at this time and describe their pain as mild. They are currently on no medication for their pain. The patient is currently doing home exercise program. The patient feels that they are progressing well at this time. He is very pleased with how the left knee is doing. It does not limit his activity at all. His activity is limited due to his right knee. Symptoms with the right knee include: pain, aching and throbbing, while the patient does not report symptoms of: giving way (not at the knee but his ankle gives way). The patient feels that they are doing poorly and report their pain level to be moderate to severe (worsened in the past couple of weeks). The following medication has been used for pain control: none. He has had cortisone injections as well as Euflexxa in the right knee previously by Dr. Darrelyn HillockGioffre. No injections recently. He is at a point where the knee bothers him at all times and he would like to have the right knee replaced. He is ready to proceed with surgery for the right knee at this time. They have been treated conservatively in the past for the above stated problem and despite conservative measures, they continue to have progressive pain and severe functional limitations and dysfunction. They have failed non-operative management including home exercise, medications, and injections. It is felt that they would  benefit from undergoing total joint replacement. Risks and benefits of the procedure have been discussed with the patient and they elect to proceed with surgery. There are no active contraindications to surgery such as ongoing infection or rapidly progressive neurological disease.   Problem List/Past Medical  Primary osteoarthritis of right knee (715.16  M17.11) Status post total left knee replacement (V43.65  Z96.652) Sleep Apnea uses CPAP Impaired Vision Hemorrhoids High blood pressure Depression Hypercholesterolemia Peripheral Edema Varicose veins Umbilical Hernia  Allergies No Known Drug Allergies  Family History Depression sister Heart Disease father Heart disease in male family member before age 55 Cerebrovascular Accident First Degree Relatives. father  Social History  Pain Contract no Tobacco / smoke exposure no Drug/Alcohol Rehab (Previously) no Illicit drug use no Children 2 Alcohol use current drinker; drinks beer and wine; only occasionally per week Tobacco use Never smoker. never smoker Current work status working part [employer]site ge[employer]time Exercise Exercises daily; does running / walking Living situation live with spouse Drug/Alcohol Rehab (Currently) no Marital status married Post-Surgical Plans Home  Medication History  Meloxicam (15MG  Tablet, 1 Oral daily, Taken starting 12/22/2013) Active. (last time till labs done) BuPROPion HCl ER (XL) (150MG  Tablet ER 24HR, Oral) Active. Metoprolol Tartrate (50MG  Tablet, Oral) Active. Atorvastatin Calcium (10MG  Tablet, Oral) Active. Amphetamine Salt Combo (25MG  Capsule ER 24HR, Oral) Active. LamoTRIgine ER (200MG  Tablet ER 24HR, Oral) Active. Aspirin EC (81MG  Tablet DR, Oral) Active. Vitamin C (Oral) Active. Super B Complex (Oral) Active. Fish Oil + D3 (1200-1000MG -UNIT Capsule, Oral) Active.  Glucosamine-Chondroitin DS (Oral) Active. Multivitamin (Oral) Active. Sertraline HCl (100MG   Tablet, Oral) Active.  Past Surgical History Arthroscopy of Knee bilateral; Right - 2008, Left - 2010 Wisdom Teeth Extraction Abdominal Hernia Repair Date: 2001. Vasectomy Date: 71990. Umbilical Hernia Repair Total Knee Replacement - Left Date: 12/2012.   Review of Systems General Not Present- Chills, Fatigue, Fever, Memory Loss, Night Sweats, Weight Gain and Weight Loss. Skin Not Present- Eczema, Hives, Itching, Lesions and Rash. HEENT Not Present- Dentures, Double Vision, Headache, Hearing Loss, Tinnitus and Visual Loss. Respiratory Not Present- Allergies, Chronic Cough, Coughing up blood, Shortness of breath at rest and Shortness of breath with exertion. Cardiovascular Not Present- Chest Pain, Difficulty Breathing Lying Down, Murmur, Palpitations, Racing/skipping heartbeats and Swelling. Gastrointestinal Not Present- Abdominal Pain, Bloody Stool, Constipation, Diarrhea, Difficulty Swallowing, Heartburn, Jaundice, Loss of appetitie, Nausea and Vomiting. Male Genitourinary Not Present- Blood in Urine, Discharge, Flank Pain, Incontinence, Painful Urination, Urgency, Urinary frequency, Urinary Retention, Urinating at Night and Weak urinary stream. Musculoskeletal Present- Back Pain and Joint Swelling. Not Present- Morning Stiffness, Muscle Pain, Muscle Weakness and Spasms. Neurological Not Present- Blackout spells, Difficulty with balance, Dizziness, Paralysis, Tremor and Weakness. Psychiatric Not Present- Insomnia.  Vitals Weight: 290 lb Height: 76in Weight was reported by patient. Height was reported by patient. Body Surface Area: 2.66 m Body Mass Index: 35.3 kg/m  Pulse: 64 (Regular)  BP: 130/82 (Sitting, Right Arm, Standard)   Physical Exam  General Mental Status -Alert, cooperative and good historian. General Appearance-pleasant, Not in acute distress. Orientation-Oriented X3. Build & Nutrition-Well nourished and Well developed.  Head and  Neck Head-normocephalic, atraumatic . Neck Global Assessment - supple, no bruit auscultated on the right, no bruit auscultated on the left.  Eye Vision-Wears corrective lenses. Pupil - Bilateral-Regular and Round. Motion - Bilateral-EOMI.  Chest and Lung Exam Auscultation Breath sounds - clear at anterior chest wall and clear at posterior chest wall. Adventitious sounds - No Adventitious sounds.  Cardiovascular Auscultation Rhythm - Regular rate and rhythm. Heart Sounds - S1 WNL and S2 WNL. Murmurs & Other Heart Sounds: Murmur 1 - Location - Aortic Area. Timing - Early systolic. Grade - II/VI(faint). Character - Low pitched.  Abdomen Inspection Contour - Generalized mild distention. Palpation/Percussion Tenderness - Abdomen is non-tender to palpation. Rigidity (guarding) - Abdomen is soft. Auscultation Auscultation of the abdomen reveals - Bowel sounds normal.  Male Genitourinary Note: Not done, not pertinent to present illness   Musculoskeletal Note: He is a well developed male in no distress. His left knee shows no swelling. His range of motion on the left is 0-125 with no tenderness or instability. The right knee has a varus deformity. Range is 5-115. There is marked crepitus on range of motion. She is tender medial greater than lateral with no instability noted.  RADIOGRAPHS: AP of both knees and lateral show the prosthesis on the left to be in excellent position. No periprosthetic abnormalities. On the right he is bone on bone medial and patellofemoral with a varus deformity.   Assessment & Plan Primary osteoarthritis of right knee (715.16  M17.11) Note:Plan is for a Right Total Knee Replacement by Dr. Lequita HaltAluisio.  Plan is to go home.  PCP - Dr. Drue NovelPaz - Patient has been seen preoperatively and felt to be stable for surgery.  The patient does not have any contraindications and will receive TXA (tranexamic acid) prior to surgery.  Signed electronically by  Lauraine RinneAlexzandrew L Omero Kowal, III PA-C

## 2014-03-16 NOTE — Anesthesia Procedure Notes (Signed)
Spinal  Patient location during procedure: OR Start time: 03/16/2014 11:23 AM End time: 03/16/2014 11:29 AM Staffing CRNA/Resident: Anne Fu Performed by: resident/CRNA  Preanesthetic Checklist Completed: patient identified, site marked, surgical consent, pre-op evaluation, timeout performed, IV checked, risks and benefits discussed and monitors and equipment checked Spinal Block Patient position: sitting Prep: Betadine Patient monitoring: heart rate, continuous pulse ox and blood pressure Approach: right paramedian Location: L2-3 Injection technique: single-shot Needle Needle type: Sprotte  Needle gauge: 24 G Needle length: 10 cm Assessment Sensory level: T6 Additional Notes Expiration date of kit checked and confirmed. Patient tolerated procedure well, without complications. X 1 attempt with noted clear CSF return loss of motor and sensory on exam post injection.

## 2014-03-16 NOTE — Transfer of Care (Signed)
Immediate Anesthesia Transfer of Care Note  Patient: James Boone  Procedure(s) Performed: Procedure(s) (LRB): RIGHT TOTAL KNEE ARTHROPLASTY (Right)  Patient Location: PACU  Anesthesia Type: Spinal  Level of Consciousness: sedated, patient cooperative and responds to stimulation  Airway & Oxygen Therapy: Patient Spontanous Breathing and Patient connected to face mask oxgen  Post-op Assessment: Report given to PACU RN and Post -op Vital signs reviewed and stable  Post vital signs: Reviewed and stable  Complications: No apparent anesthesia complications

## 2014-03-16 NOTE — Anesthesia Postprocedure Evaluation (Signed)
Anesthesia Post Note  Patient: James Boone  Procedure(s) Performed: Procedure(s) (LRB): RIGHT TOTAL KNEE ARTHROPLASTY (Right)  Anesthesia type: Spinal  Patient location: PACU  Post pain: Pain level controlled  Post assessment: Post-op Vital signs reviewed  Last Vitals: BP 133/76  Pulse 57  Temp(Src) 36.5 C (Oral)  Resp 16  Ht 6\' 4"  (1.93 m)  Wt 296 lb (134.265 kg)  BMI 36.05 kg/m2  SpO2 98%  Post vital signs: Reviewed  Level of consciousness: sedated  Complications: No apparent anesthesia complications

## 2014-03-17 ENCOUNTER — Encounter (HOSPITAL_COMMUNITY): Payer: Self-pay | Admitting: Orthopedic Surgery

## 2014-03-17 LAB — CBC
HEMATOCRIT: 37.6 % — AB (ref 39.0–52.0)
Hemoglobin: 12.6 g/dL — ABNORMAL LOW (ref 13.0–17.0)
MCH: 28.4 pg (ref 26.0–34.0)
MCHC: 33.5 g/dL (ref 30.0–36.0)
MCV: 84.9 fL (ref 78.0–100.0)
Platelets: 151 10*3/uL (ref 150–400)
RBC: 4.43 MIL/uL (ref 4.22–5.81)
RDW: 13.7 % (ref 11.5–15.5)
WBC: 10.7 10*3/uL — AB (ref 4.0–10.5)

## 2014-03-17 LAB — BASIC METABOLIC PANEL
Anion gap: 8 (ref 5–15)
BUN: 19 mg/dL (ref 6–23)
CO2: 26 mEq/L (ref 19–32)
Calcium: 8.6 mg/dL (ref 8.4–10.5)
Chloride: 101 mEq/L (ref 96–112)
Creatinine, Ser: 0.99 mg/dL (ref 0.50–1.35)
GFR calc Af Amer: >90 mL/min (ref >90)
Glucose, Bld: 121 mg/dL — ABNORMAL HIGH (ref 70–99)
POTASSIUM: 4.4 meq/L (ref 3.7–5.3)
Sodium: 135 mEq/L — ABNORMAL LOW (ref 137–147)

## 2014-03-17 MED ORDER — RIVAROXABAN 10 MG PO TABS: 10.0000 mg | ORAL_TABLET | Freq: Every day | ORAL | Status: DC

## 2014-03-17 MED ORDER — TRAMADOL HCL 50 MG PO TABS: 50.0000 mg | ORAL_TABLET | Freq: Four times a day (QID) | ORAL | Status: DC | PRN

## 2014-03-17 MED ORDER — OXYCODONE HCL 5 MG PO TABS: 5.0000 mg | ORAL_TABLET | ORAL | Status: DC | PRN

## 2014-03-17 MED ORDER — METHOCARBAMOL 500 MG PO TABS: 500.0000 mg | ORAL_TABLET | Freq: Four times a day (QID) | ORAL | Status: DC | PRN

## 2014-03-17 NOTE — Progress Notes (Signed)
03/17/14 1514  PT Visit Information  Last PT Received On 03/17/14  Assistance Needed +1  History of Present Illness R TKA  PT Time Calculation  PT Start Time 1422  PT Stop Time 1440  PT Time Calculation (min) 18 min  Subjective Data  Patient Stated Goal to go home  Precautions  Precautions Knee  Restrictions  Other Position/Activity Restrictions WBAT  Pain Assessment  Pain Assessment 0-10  Pain Score 4  Pain Location right knee  Pain Intervention(s) Limited activity within patient's tolerance;Monitored during session;Patient requesting pain meds-RN notified  Cognition  Arousal/Alertness Awake/alert  Behavior During Therapy WFL for tasks assessed/performed  Overall Cognitive Status Within Functional Limits for tasks assessed  Bed Mobility  Overal bed mobility Modified Independent  Transfers  Overall transfer level Needs assistance  Equipment used Rolling walker (2 wheeled)  Transfers Sit to/from Stand  Sit to Stand Supervision  General transfer comment cues for  hand placement  Ambulation/Gait  Ambulation/Gait assistance Supervision;Min guard  Ambulation Distance (Feet) 130 Feet  Assistive device Rolling walker (2 wheeled)  Gait Pattern/deviations Step-to pattern;Step-through pattern;Decreased stride length;Antalgic  General Gait Details cues for RW position, step length  Exercises  Exercises Total Joint  Total Joint Exercises  Ankle Circles/Pumps AROM;10 reps;Both  Quad Sets Strengthening;Both;10 reps  Heel Slides AROM;Strengthening;Right;10 reps  Straight Leg Raises AROM;Strengthening;Right;10 reps  Short Arc Quad AROM;Strengthening;Right;10 reps  Goniometric ROM ~ -9 to 70*  PT - End of Session  Activity Tolerance Patient tolerated treatment well  Patient left in bed;with call bell/phone within reach  Nurse Communication Mobility status;Patient requests pain meds  PT - Assessment/Plan  PT Plan Current plan remains appropriate  PT Frequency 7X/week  Follow Up  Recommendations Home health PT  PT equipment None recommended by PT  PT Goal Progression  Progress towards PT goals Progressing toward goals  Acute Rehab PT Goals  PT Goal Formulation With patient  Time For Goal Achievement 03/20/14  Potential to Achieve Goals Good  PT General Charges  $$ ACUTE PT VISIT 1 Procedure  PT Treatments  $Gait Training 8-22 mins

## 2014-03-17 NOTE — Progress Notes (Signed)
Utilization review completed.  

## 2014-03-17 NOTE — Discharge Instructions (Addendum)
° °Dr. Frank Aluisio °Total Joint Specialist °[facility] °[address] °[place], [place] [address] °[phone] ° °TOTAL KNEE REPLACEMENT POSTOPERATIVE DIRECTIONS ° ° ° °Knee Rehabilitation, Guidelines Following Surgery  °Results after knee surgery are often greatly improved when you follow the exercise, range of motion and muscle strengthening exercises prescribed by your doctor. Safety measures are also important to protect the knee from further injury. Any time any of these exercises cause you to have increased pain or swelling in your knee joint, decrease the amount until you are comfortable again and slowly increase them. If you have problems or questions, call your caregiver or physical therapist for advice.  ° °HOME CARE INSTRUCTIONS  °Remove items at home which could result in a fall. This includes throw rugs or furniture in walking pathways.  °Continue medications as instructed at time of discharge. °You may have some home medications which will be placed on hold until you complete the course of blood thinner medication.  °You may start showering once you are discharged home but do not submerge the incision under water. Just pat the incision dry and apply a dry gauze dressing on daily. °Walk with walker as instructed.  °You may resume a sexual relationship in one month or when given the OK by  your doctor.  °· Use walker as long as suggested by your caregivers. °· Avoid periods of inactivity such as sitting longer than an hour when not asleep. This helps prevent blood clots.  °You may put full weight on your legs and walk as much as is comfortable.  °You may return to work once you are cleared by your doctor.  °Do not drive a car for 6 weeks or until released by you surgeon.  °· Do not drive while taking narcotics.  °Wear the elastic stockings for three weeks following surgery during the day but you may remove then at night. °Make sure you keep all of your appointments after your  operation with all of your doctors and caregivers. You should call the office at the above phone number and make an appointment for approximately two weeks after the date of your surgery. °Change the dressing daily and reapply a dry dressing each time. °Please pick up a stool softener and laxative for home use as long as you are requiring pain medications. °· Continue to use ice on the knee for pain and swelling from surgery. You may notice swelling that will progress down to the foot and ankle.  This is normal after surgery.  Elevate the leg when you are not up walking on it.   °It is important for you to complete the blood thinner medication as prescribed by your doctor. °· Continue to use the breathing machine which will help keep your temperature down.  It is common for your temperature to cycle up and down following surgery, especially at night when you are not up moving around and exerting yourself.  The breathing machine keeps your lungs expanded and your temperature down. ° °RANGE OF MOTION AND STRENGTHENING EXERCISES  °Rehabilitation of the knee is important following a knee injury or an operation. After just a few days of immobilization, the muscles of the thigh which control the knee become weakened and shrink (atrophy). Knee exercises are designed to build up the tone and strength of the thigh muscles and to improve knee motion. Often times heat used for twenty to thirty minutes before working out will loosen up your tissues and help with improving the   range of motion but do not use heat for the first two weeks following surgery. These exercises can be done on a training (exercise) mat, on the floor, on a table or on a bed. Use what ever works the best and is most comfortable for you Knee exercises include:  Leg Lifts - While your knee is still immobilized in a splint or cast, you can do straight leg raises. Lift the leg to 60 degrees, hold for 3 sec, and slowly lower the leg. Repeat 10-20 times 2-3  times daily. Perform this exercise against resistance later as your knee gets better.  Quad and Hamstring Sets - Tighten up the muscle on the front of the thigh (Quad) and hold for 5-10 sec. Repeat this 10-20 times hourly. Hamstring sets are done by pushing the foot backward against an object and holding for 5-10 sec. Repeat as with quad sets.  A rehabilitation program following serious knee injuries can speed recovery and prevent re-injury in the future due to weakened muscles. Contact your doctor or a physical therapist for more information on knee rehabilitation.   SKILLED REHAB INSTRUCTIONS: If the patient is transferred to a skilled rehab facility following release from the hospital, a list of the current medications will be sent to the facility for the patient to continue.  When discharged from the skilled rehab facility, please have the facility set up the patient's Home Health Physical Therapy prior to being released. Also, the skilled facility will be responsible for providing the patient with their medications at time of release from the facility to include their pain medication, the muscle relaxants, and their blood thinner medication. If the patient is still at the rehab facility at time of the two week follow up appointment, the skilled rehab facility will also need to assist the patient in arranging follow up appointment in our office and any transportation needs.  MAKE SURE YOU:  Understand these instructions.  Will watch your condition.  Will get help right away if you are not doing well or get worse.    Pick up stool softner and laxative for home. Do not submerge incision under water. May shower starting Thursday 03/19/2014 Continue to use ice for pain and swelling from surgery.  Take Xarelto for three weeks, then discontinue Xarelto. Once the patient has completed the Xarelto, they may resume the 81 mg Aspirin.   Information on my medicine - XARELTO (Rivaroxaban)  This  medication education was reviewed with me or my healthcare representative as part of my discharge preparation.  The pharmacist that spoke with me during my hospital stay was:  Milus GlazierMoran, Elisabel Hanover S, [facility]  Why was Xarelto prescribed for you? Xarelto was prescribed for you to reduce the risk of blood clots forming after orthopedic surgery. The medical term for these abnormal blood clots is venous thromboembolism (VTE).  What do you need to know about xarelto ? Take your Xarelto ONCE DAILY at the same time every day. You may take it either with or without food.  If you have difficulty swallowing the tablet whole, you may crush it and mix in applesauce just prior to taking your dose.  Take Xarelto exactly as prescribed by your doctor and DO NOT stop taking Xarelto without talking to the doctor who prescribed the medication.  Stopping without other VTE prevention medication to take the place of Xarelto may increase your risk of developing a clot.  After discharge, you should have regular check-up appointments with your healthcare provider that is prescribing  your Xarelto.    What do you do if you miss a dose? If you miss a dose, take it as soon as you remember on the same day then continue your regularly scheduled once daily regimen the next day. Do not take two doses of Xarelto on the same day.   Important Safety Information A possible side effect of Xarelto is bleeding. You should call your healthcare provider right away if you experience any of the following:   Bleeding from an injury or your nose that does not stop.   Unusual colored urine (red or dark brown) or unusual colored stools (red or black).   Unusual bruising for unknown reasons.   A serious fall or if you hit your head (even if there is no bleeding).  Some medicines may interact with Xarelto and might increase your risk of bleeding while on Xarelto. To help avoid this, consult your healthcare provider or pharmacist prior to  using any new prescription or non-prescription medications, including herbals, vitamins, non-steroidal anti-inflammatory drugs (NSAIDs) and supplements.  This website has more information on Xarelto: [email or web address].

## 2014-03-17 NOTE — Evaluation (Addendum)
Physical Therapy Evaluation Patient Details Name: James Boone MRN: [id number] DOB: 06/27/1960 Today's Date: 03/17/2014   History of Present Illness  R TKA  Clinical Impression  Pt will benefit from PT to address deficits below; should progress very well; Pt is pleasant and motivated to work with PT    Follow Up Recommendations Home health PT    Equipment Recommendations  None recommended by PT    Recommendations for Other Services       Precautions / Restrictions Precautions Precautions: Knee Restrictions Other Position/Activity Restrictions: WBAT      Mobility  Bed Mobility               General bed mobility comments: in chair  Transfers Overall transfer level: Needs assistance Equipment used: Rolling walker (2 wheeled) Transfers: Sit to/from Stand Sit to Stand: Min guard         General transfer comment: cues for  hand placement  Ambulation/Gait Ambulation/Gait assistance: Min assist Ambulation Distance (Feet): 90 Feet Assistive device: Rolling walker (2 wheeled) Gait Pattern/deviations: Step-to pattern     General Gait Details: cues for RW position  Stairs            Wheelchair Mobility    Modified Rankin (Stroke Patients Only)       Balance                                             Pertinent Vitals/Pain Pain Assessment: 0-10 Pain Score: 5  Pain Location: R knee Pain Descriptors / Indicators: Constant Pain Intervention(s): Monitored during session;Premedicated before session;Limited activity within patient's tolerance    Home Living Family/patient expects to be discharged to:: Private residence Living Arrangements: Spouse/significant other Available Help at Discharge: Available 24 hours/day Type of Home: House Home Access: Stairs to enter   Enter[employer] CorporationEntrance Stairs-Number of Steps: 3 Home Layout: Multi-level;Full bath on main level;Other (Comment) Home Equipment: Crutches;Walker - 2 wheels;Shower seat;Bedside  commode      Prior Function Level of Independence: Independent               Hand Dominance        Extremity/Trunk Assessment   Upper Extremity Assessment: Defer to OT evaluation;Overall WFL for tasks assessed             RLE Deficits / Details: knee grossly ext 3/5; ankle WFL       Communication   Communication: No difficulties  Cognition Arousal/Alertness: Awake/alert Behavior During Therapy: WFL for tasks assessed/performed Overall Cognitive Status: Within Functional Limits for tasks assessed                      General Comments General comments (skin integrity, edema, etc.): pt with old R ankle injury and WBs on the lateral border of his foot, using incr toe extension, df to maintain balance    Exercises Total Joint Exercises Ankle Circles/Pumps: AROM;10 reps;Both Quad Sets: Strengthening;Both;10 reps Heel Slides: AROM;Strengthening;Right;10 reps Straight Leg Raises: AROM;Strengthening;Right;10 reps      Assessment/Plan    PT Assessment Patient needs continued PT services  PT Diagnosis Difficulty walking   PT Problem List Decreased strength;Decreased activity tolerance;Decreased balance;Decreased mobility;Decreased knowledge of use of DME;Decreased range of motion  PT Treatment Interventions DME instruction;Gait training;Functional mobility training;Therapeutic activities;Therapeutic exercise;Stair training;Patient/family education;Balance training   PT Goals (Current goals can be found in the Care Plan section)  Acute Rehab PT Goals Patient Stated Goal: to go home PT Goal Formulation: With patient Time For Goal Achievement: 03/20/14 Potential to Achieve Goals: Good    Frequency 7X/week   Barriers to discharge        Co-evaluation               End of Session   Activity Tolerance: Patient tolerated treatment well Patient left: with call bell/phone within reach;in chair           Time: [id number] PT Time Calculation  (min): 18 min   Charges:   PT Evaluation $Initial PT Evaluation Tier I: 1 Procedure PT Treatments $Gait Training: 8-22 mins   PT G Codes:          James Boone 03/17/2014, 3:10 PM

## 2014-03-17 NOTE — Progress Notes (Addendum)
   Subjective: 1 Day Post-Op Procedure(s) (LRB): RIGHT TOTAL KNEE ARTHROPLASTY (Right) Patient reports pain as mild and moderate.   Patient seen in rounds with Dr. Lequita HaltAluisio. Patient is well, but has had some minor complaints of pain in the knee, requiring pain medications We will start therapy today.  Plan is to go Home after hospital stay.  Objective: Vital signs in last 24 hours: Temp:  [97.4 F (36.3 C)-97.9 F (36.6 C)] 97.8 F (36.6 C) (10/27 0535) Pulse Rate:  [55-85] 62 (10/27 0535) Resp:  [15-20] 16 (10/27 0600) BP: (97-144)/(50-89) 118/63 mmHg (10/27 0535) SpO2:  [94 %-100 %] 94 % (10/27 0600) Weight:  [134.265 kg (296 lb)] 134.265 kg (296 lb) (10/26 1445)  Intake/Output from previous day:  Intake/Output Summary (Last 24 hours) at 03/17/14 0827 Last data filed at 03/17/14 0609  Gross per 24 hour  Intake 6122.66 ml  Output   4700 ml  Net 1422.66 ml    Labs:  Recent Labs  03/17/14 0406  HGB 12.6*    Recent Labs  03/17/14 0406  WBC 10.7*  RBC 4.43  HCT 37.6*  PLT 151    Recent Labs  03/17/14 0406  NA 135*  K 4.4  CL 101  CO2 26  BUN 19  CREATININE 0.99  GLUCOSE 121*  CALCIUM 8.6   No results found for this basename: LABPT, INR,  in the last 72 hours  EXAM General - Patient is Alert, Appropriate and Oriented Extremity - Neurovascular intact Sensation intact distally Dressing - dressing C/D/I Motor Function - intact, moving foot and toes well on exam.  Hemovac pulled without difficulty.  Past Medical History  Diagnosis Date  . Hyperlipidemia   . Depression   . Hypertension   . Hypogonadism male   . ADD (attention deficit disorder)   . Abnormal liver function test   . Allergic rhinitis   . Hyperglycemia   . Arthritis   . Hemorrhoid   . Hypercholesteremia   . Peripheral edema   . Varicose veins     both legs  . Sleep apnea 2005    dx in 2005 , on CPAP setting of 13  . Back pain     lower  . ADD (attention deficit disorder)      Assessment/Plan: 1 Day Post-Op Procedure(s) (LRB): RIGHT TOTAL KNEE ARTHROPLASTY (Right) Active Problems:   OA (osteoarthritis) of knee  Estimated body mass index is 36.05 kg/(m^2) as calculated from the following:   Height as of this encounter: 6\' 4"  (1.93 m).   Weight as of this encounter: 134.265 kg (296 lb). Advance diet Up with therapy Discharge home with home health  DVT Prophylaxis - Xarelto Weight-Bearing as tolerated to right leg D/C O2 and Pulse OX and try on Room Air  Addendum: Patient doing well this morning.  If does well with both sessions of therapy and meets all goals, then possibly home later this afternoon.  Will see how he does.  Will set up possible discharge but will wait until after sessions to set about his progress.  Will need to get notified/called if ready to go in order to put in DC home order.  Diet - Cardiac diet Follow up - in 2 weeks Activity - WBAT Disposition - Home Condition Upon Discharge - Pending upon therapy results today. D/C Meds - See DC Summary DVT Prophylaxis - Xarelto  Avel Peacerew Perkins, PA-C Orthopaedic Surgery 03/17/2014, 8:19 AM

## 2014-03-17 NOTE — Discharge Summary (Signed)
Physician Discharge Summary   Patient ID: James Boone MRN: [id number] DOB/AGE: 1960/11/03 53 y.o.  Admit date: 03/16/2014 Discharge date: 03/18/2014  Primary Diagnosis:  Osteoarthritis Right knee(s)  Admission Diagnoses:  Past Medical History  Diagnosis Date  . Hyperlipidemia   . Depression   . Hypertension   . Hypogonadism male   . ADD (attention deficit disorder)   . Abnormal liver function test   . Allergic rhinitis   . Hyperglycemia   . Arthritis   . Hemorrhoid   . Hypercholesteremia   . Peripheral edema   . Varicose veins     both legs  . Sleep apnea 2005    dx in 2005 , on CPAP setting of 13  . Back pain     lower  . ADD (attention deficit disorder)    Discharge Diagnoses:   Active Problems:   OA (osteoarthritis) of knee  Estimated body mass index is 36.05 kg/(m^2) as calculated from the following:   Height as of this encounter: _0  (1.93 m).   Weight as of this encounter: 134.265 kg (296 lb).  Procedure:  Procedure(s) (LRB): RIGHT TOTAL KNEE ARTHROPLASTY (Right)   Consults: None  HPI: James Boone is a 53 y.o. year old male with end stage OA of his right knee with progressively worsening pain and dysfunction. He has constant pain, with activity and at rest and significant functional deficits with difficulties even with ADLs. He has had extensive non-op management including analgesics, injections of cortisone, and home exercise program, but remains in significant pain with significant dysfunction. He presents now for right Total Knee Arthroplasty.   Laboratory Data: Admission on 03/16/2014, Discharged on 03/18/2014  Component Date Value Ref Range Status  . ABO/RH(D) 03/16/2014 O POS   Final  . Antibody Screen 03/16/2014 NEG   Final  . Sample Expiration 03/16/2014 03/19/2014   Final  . WBC 03/17/2014 10.7* 4.0 - 10.5 K/uL Final  . RBC 03/17/2014 4.43  4.22 - 5.81 MIL/uL Final  . Hemoglobin 03/17/2014 12.6* 13.0 - 17.0 g/dL Final  . HCT 03/17/2014 37.6*  39.0 - 52.0 % Final  . MCV 03/17/2014 84.9  78.0 - 100.0 fL Final  . MCH 03/17/2014 28.4  26.0 - 34.0 pg Final  . MCHC 03/17/2014 33.5  30.0 - 36.0 g/dL Final  . RDW 03/17/2014 13.7  11.5 - 15.5 % Final  . Platelets 03/17/2014 151  150 - 400 K/uL Final  . Sodium 03/17/2014 135* 137 - 147 mEq/L Final  . Potassium 03/17/2014 4.4  3.7 - 5.3 mEq/L Final  . Chloride 03/17/2014 101  96 - 112 mEq/L Final  . CO2 03/17/2014 26  19 - 32 mEq/L Final  . Glucose, Bld 03/17/2014 121* 70 - 99 mg/dL Final  . BUN 03/17/2014 19  6 - 23 mg/dL Final  . Creatinine, Ser 03/17/2014 0.99  0.50 - 1.35 mg/dL Final  . Calcium 03/17/2014 8.6  8.4 - 10.5 mg/dL Final  . GFR calc non Af Amer 03/17/2014 >90  >90 mL/min Final  . GFR calc Af Amer 03/17/2014 >90  >90 mL/min Final   Comment: (NOTE)                          The eGFR has been calculated using the CKD EPI equation.                          This calculation has not  been validated in all clinical situations.                          eGFR's persistently <90 mL/min signify possible Chronic Kidney                          Disease.  . Anion gap 03/17/2014 8  5 - 15 Final  . WBC 03/18/2014 9.1  4.0 - 10.5 K/uL Final  . RBC 03/18/2014 4.18* 4.22 - 5.81 MIL/uL Final  . Hemoglobin 03/18/2014 12.0* 13.0 - 17.0 g/dL Final  . HCT 03/18/2014 35.3* 39.0 - 52.0 % Final  . MCV 03/18/2014 84.4  78.0 - 100.0 fL Final  . MCH 03/18/2014 28.7  26.0 - 34.0 pg Final  . MCHC 03/18/2014 34.0  30.0 - 36.0 g/dL Final  . RDW 03/18/2014 14.2  11.5 - 15.5 % Final  . Platelets 03/18/2014 158  150 - 400 K/uL Final  . Sodium 03/18/2014 133* 137 - 147 mEq/L Final  . Potassium 03/18/2014 4.3  3.7 - 5.3 mEq/L Final  . Chloride 03/18/2014 96  96 - 112 mEq/L Final  . CO2 03/18/2014 26  19 - 32 mEq/L Final  . Glucose, Bld 03/18/2014 116* 70 - 99 mg/dL Final  . BUN 03/18/2014 19  6 - 23 mg/dL Final  . Creatinine, Ser 03/18/2014 0.92  0.50 - 1.35 mg/dL Final  . Calcium 03/18/2014 8.8  8.4 -  10.5 mg/dL Final  . GFR calc non Af Amer 03/18/2014 >90  >90 mL/min Final  . GFR calc Af Amer 03/18/2014 >90  >90 mL/min Final   Comment: (NOTE)                          The eGFR has been calculated using the CKD EPI equation.                          This calculation has not been validated in all clinical situations.                          eGFR's persistently <90 mL/min signify possible Chronic Kidney                          Disease.  Georgiann Hahn gap 03/18/2014 11  5 - 15 Final  Hospital Outpatient Visit on 03/05/2014  Component Date Value Ref Range Status  . MRSA, PCR 03/05/2014 NEGATIVE  NEGATIVE Final  . Staphylococcus aureus 03/05/2014 POSITIVE* NEGATIVE Final   Comment:                                 The Xpert SA Assay (FDA                          approved for NASAL specimens                          in patients over 55 years of age),                          is one component of  a comprehensive surveillance                          program.  Test performance has                          been validated by [facility] for patients greater                          than or equal to 89 year old.                          It is not intended                          to diagnose infection nor to                          guide or monitor treatment.  Marland Kitchen aPTT 03/05/2014 29  24 - 37 seconds Final  . WBC 03/05/2014 6.7  4.0 - 10.5 K/uL Final  . RBC 03/05/2014 5.52  4.22 - 5.81 MIL/uL Final  . Hemoglobin 03/05/2014 15.8  13.0 - 17.0 g/dL Final  . HCT 03/05/2014 47.6  39.0 - 52.0 % Final  . MCV 03/05/2014 86.2  78.0 - 100.0 fL Final  . MCH 03/05/2014 28.6  26.0 - 34.0 pg Final  . MCHC 03/05/2014 33.2  30.0 - 36.0 g/dL Final  . RDW 03/05/2014 14.1  11.5 - 15.5 % Final  . Platelets 03/05/2014 182  150 - 400 K/uL Final  . Sodium 03/05/2014 140  137 - 147 mEq/L Final  . Potassium 03/05/2014 5.0  3.7 - 5.3 mEq/L Final  . Chloride 03/05/2014 99   96 - 112 mEq/L Final  . CO2 03/05/2014 28  19 - 32 mEq/L Final  . Glucose, Bld 03/05/2014 90  70 - 99 mg/dL Final  . BUN 03/05/2014 25* 6 - 23 mg/dL Final  . Creatinine, Ser 03/05/2014 1.08  0.50 - 1.35 mg/dL Final  . Calcium 03/05/2014 10.2  8.4 - 10.5 mg/dL Final  . Total Protein 03/05/2014 7.8  6.0 - 8.3 g/dL Final  . Albumin 03/05/2014 4.2  3.5 - 5.2 g/dL Final  . AST 03/05/2014 38* 0 - 37 U/L Final  . ALT 03/05/2014 44  0 - 53 U/L Final  . Alkaline Phosphatase 03/05/2014 124* 39 - 117 U/L Final  . Total Bilirubin 03/05/2014 0.4  0.3 - 1.2 mg/dL Final  . GFR calc non Af Amer 03/05/2014 75* >90 mL/min Final  . GFR calc Af Amer 03/05/2014 87* >90 mL/min Final   Comment: (NOTE)                          The eGFR has been calculated using the CKD EPI equation.                          This calculation has not been validated in all clinical situations.  eGFR's persistently <90 mL/min signify possible Chronic Kidney                          Disease.  . Anion gap 03/05/2014 13  5 - 15 Final  . Prothrombin Time 03/05/2014 14.2  11.6 - 15.2 seconds Final  . INR 03/05/2014 1.08  0.00 - 1.49 Final  . Color, Urine 03/05/2014 YELLOW  YELLOW Final  . APPearance 03/05/2014 CLEAR  CLEAR Final  . Specific Gravity, Urine 03/05/2014 1.021  1.005 - 1.030 Final  . pH 03/05/2014 6.0  5.0 - 8.0 Final  . Glucose, UA 03/05/2014 NEGATIVE  NEGATIVE mg/dL Final  . Hgb urine dipstick 03/05/2014 NEGATIVE  NEGATIVE Final  . Bilirubin Urine 03/05/2014 NEGATIVE  NEGATIVE Final  . Ketones, ur 03/05/2014 NEGATIVE  NEGATIVE mg/dL Final  . Protein, ur 03/05/2014 NEGATIVE  NEGATIVE mg/dL Final  . Urobilinogen, UA 03/05/2014 0.2  0.0 - 1.0 mg/dL Final  . Nitrite 03/05/2014 NEGATIVE  NEGATIVE Final  . Leukocytes, UA 03/05/2014 NEGATIVE  NEGATIVE Final   MICROSCOPIC NOT DONE ON URINES WITH NEGATIVE PROTEIN, BLOOD, LEUKOCYTES, NITRITE, OR GLUCOSE <1000 mg/dL.     X-Rays:Dg Chest 2  View  03/05/2014   CLINICAL DATA:  54 year old male preop for knee surgery. Nonsmoker with hypertension.  EXAM: CHEST - 2 VIEW  COMPARISON:  12/23/2012  FINDINGS: Cardiomediastinal silhouette projects within normal limits in size and contour. No confluent airspace disease, pneumothorax, or pleural effusion.  No displaced fracture.  Unremarkable appearance of the upper abdomen.  IMPRESSION: No radiographic evidence of acute cardiopulmonary disease.  Signed,  Dulcy Fanny. Earleen Newport, DO  [facility]  [facility]   Electronically Signed   By: Corrie Mckusick D.O.   On: 03/05/2014 16:11    EKG: Orders placed or performed during the hospital encounter of 03/16/14  . EKG     Hospital Course: TREXTON ESCAMILLA is a 53 y.o. who was admitted to [facility]. They were brought to the operating room on 03/16/2014 and underwent Procedure(s): RIGHT TOTAL KNEE ARTHROPLASTY.  Patient tolerated the procedure well and was later transferred to the recovery room and then to the orthopaedic floor for postoperative care.  They were given PO and IV analgesics for pain control following their surgery.  They were given 24 hours of postoperative antibiotics of  Anti-infectives    Start     Dose/Rate Route Frequency Ordered Stop   03/16/14 1800  ceFAZolin (ANCEF) IVPB 2 g/50 mL premix     2 g100 mL/hr over 30 Minutes Intravenous Every 6 hours 03/16/14 1454 03/17/14 0041   03/16/14 0600  ceFAZolin (ANCEF) 3 g in dextrose 5 % 50 mL IVPB     3 g160 mL/hr over 30 Minutes Intravenous On call to O.R. 03/15/14 1815 03/16/14 1138     and started on DVT prophylaxis in the form of Xarelto.   PT and OT were ordered for total joint protocol.  Discharge planning consulted to help with postop disposition and equipment needs.  Patient had a decent night on the evening of surgery.  They started to get up OOB with therapy on day one. Hemovac drain was pulled without difficulty.  Continued to work  with therapy into day two.  Dressing was changed on day two and the incision was healing well.  Patient was seen in rounds and was ready to go home.  Diet - Cardiac diet  Follow up - in 2  weeks  Activity - WBAT  Disposition - Home  Condition Upon Discharge - improving D/C Meds - See DC Summary  DVT Prophylaxis - Xarelto       Discharge Instructions    Call MD / Call 911    Complete by:  As directed   If you experience chest pain or shortness of breath, CALL 911 and be transported to the hospital emergency room.  If you develope a fever above 101 F, pus (white drainage) or increased drainage or redness at the wound, or calf pain, call your surgeon's office.     Change dressing    Complete by:  As directed   Change dressing daily with sterile 4 x 4 inch gauze dressing and apply TED hose. Do not submerge the incision under water.     Constipation Prevention    Complete by:  As directed   Drink plenty of fluids.  Prune juice may be helpful.  You may use a stool softener, such as Colace (over the counter) 100 mg twice a day.  Use MiraLax (over the counter) for constipation as needed.     Diet - low sodium heart healthy    Complete by:  As directed      Discharge instructions    Complete by:  As directed   Pick up stool softner and laxative for home. Do not submerge incision under water. May shower. Continue to use ice for pain and swelling from surgery.  Take Xarelto for three weeks, then discontinue Xarelto. Once the patient has completed the Xarelto, they may resume the 81 mg Aspirin.     Do not put a pillow under the knee. Place it under the heel.    Complete by:  As directed      Do not sit on low chairs, stoools or toilet seats, as it may be difficult to get up from low surfaces    Complete by:  As directed      Driving restrictions    Complete by:  As directed   No driving until released by the physician.     Increase activity slowly as tolerated    Complete by:  As directed       Lifting restrictions    Complete by:  As directed   No lifting until released by the physician.     Patient may shower    Complete by:  As directed   You may shower without a dressing once there is no drainage.  Do not wash over the wound.  If drainage remains, do not shower until drainage stops.     TED hose    Complete by:  As directed   Use stockings (TED hose) for 3 weeks on both leg(s).  You may remove them at night for sleeping.     Weight bearing as tolerated    Complete by:  As directed   Laterality:  right  Extremity:  Lower            Medication List    STOP taking these medications        aspirin EC 81 MG tablet     b complex vitamins tablet     Fish Oil 500 MG Caps     glucosamine-chondroitin 500-400 MG tablet     multivitamin with minerals Tabs tablet     naproxen sodium 220 MG tablet  Commonly known as:  ANAPROX     NEEDLE (DISP) 23 G 23G X 1-1/2" Misc  testosterone cypionate 200 MG/ML injection  Commonly known as:  DEPOTESTOTERONE CYPIONATE     vitamin C 1000 MG tablet      TAKE these medications        amphetamine-dextroamphetamine 25 MG 24 hr capsule  Commonly known as:  ADDERALL XR  Take 50 mg by mouth every morning.     atorvastatin 10 MG tablet  Commonly known as:  LIPITOR  Take 10 mg by mouth every morning.     buPROPion 150 MG 24 hr tablet  Commonly known as:  WELLBUTRIN XL  Take 150 mg by mouth every morning.     lamoTRIgine 100 MG tablet  Commonly known as:  LAMICTAL  Take 150 mg by mouth every evening.     methocarbamol 500 MG tablet  Commonly known as:  ROBAXIN  Take 1 tablet (500 mg total) by mouth every 6 (six) hours as needed for muscle spasms.     metoprolol 50 MG tablet  Commonly known as:  LOPRESSOR  Take 50 mg by mouth 2 (two) times daily.     oxyCODONE 5 MG immediate release tablet  Commonly known as:  Oxy IR/ROXICODONE  Take 1-2 tablets (5-10 mg total) by mouth every 3 (three) hours as needed for  moderate pain, severe pain or breakthrough pain.     rivaroxaban 10 MG Tabs tablet  Commonly known as:  XARELTO  - Take 1 tablet (10 mg total) by mouth daily with breakfast. Take Xarelto for three weeks, then discontinue Xarelto.  - Once the patient has completed the Xarelto, they may resume the 81 mg Aspirin.     sertraline 100 MG tablet  Commonly known as:  ZOLOFT  Take 200 mg by mouth every morning.     traMADol 50 MG tablet  Commonly known as:  ULTRAM  Take 1-2 tablets (50-100 mg total) by mouth every 6 (six) hours as needed (mild pain).       Follow-up Information    Follow up with Gearlean Alf, MD. Schedule an appointment as soon as possible for a visit in 2 weeks.   Specialty:  Orthopedic Surgery   Why:  Call office at [phone] to set up follow up appointment.   Contact information:   [address] [place] [address] [phone]       Follow up with [facility].   Why:  home health physical therapy   Contact information:   [address] [place] [address] [phone]       Signed: Arlee Muslim, PA-C Orthopaedic Surgery 03/26/2014, 9:47 AM

## 2014-03-17 NOTE — Progress Notes (Signed)
Occupational Therapy Evaluation Patient Details Name: James Boone MRN: [id number] DOB: 11/18/1960 Today's Date: 03/17/2014    History of Present Illness R TKA   Clinical Impression   PTA, pt independent with ADL and mobility. Pt making excellent progress. Completed all education regarding ADL and functional mobility for ADL and will most likely be able to D/C home with intermittent S after pt able to navigate stairs and pain is controlled. Pt will have adequate S for home D/C. OT signing off.     Follow Up Recommendations  No OT follow up;Supervision - Intermittent    Equipment Recommendations  None recommended by OT    Recommendations for Other Services       Precautions / Restrictions Precautions Precautions: Knee      Mobility Bed Mobility Overal bed mobility: Modified Independent                Transfers Overall transfer level: Needs assistance Equipment used: Rolling walker (2 wheeled) Transfers: Sit to/from BJ'sStand;Stand Pivot Transfers Sit to Stand: Min guard Stand pivot transfers: Min assist            Balance Overall balance assessment: Needs assistance           Standing balance-Leahy Scale: Fair                              ADL Overall ADL's : Needs assistance/impaired                                     Functional mobility during ADLs: Minimal assistance;Rolling walker;Cueing for safety General ADL Comments: Overall min a for LB ADL. Educated on compensatory techniques and shower transfer techniques. Educated on appropriate use of DME for ADL. Pt verbalized understanding.Will be able to assist as needed.     Vision                     Perception     Praxis      Pertinent Vitals/Pain Pain Assessment: 0-10 Pain Score: 5  Pain Location: R knee Pain Descriptors / Indicators: Aching Pain Intervention(s): Limited activity within patient's tolerance;Monitored during session;Repositioned     Hand  Dominance Right   Extremity/Trunk Assessment Upper Extremity Assessment Upper Extremity Assessment: Overall WFL for tasks assessed   Lower Extremity Assessment Lower Extremity Assessment: Defer to PT evaluation   Cervical / Trunk Assessment Cervical / Trunk Assessment: Normal   Communication Communication Communication: No difficulties   Cognition Arousal/Alertness: Awake/alert Behavior During Therapy: WFL for tasks assessed/performed Overall Cognitive Status: Within Functional Limits for tasks assessed                     General Comments       Exercises       Shoulder Instructions      Home Living Family/patient expects to be discharged to:: Private residence Living Arrangements: Spouse/significant other Available Help at Discharge: Available 24 hours/day Type of Home: House Home Access: Stairs to enter Entergy CorporationEntrance Stairs-Number of Steps: 3 Entrance Stairs-Rails: Left Home Layout: Multi-level;Full bath on main level;Other (Comment) (plans to live on main levelon sleeper sofa)     Bathroom Shower/Tub: Producer, television/film/videoWalk-in shower   Bathroom Toilet: Standard     Home Equipment: Crutches;Walker - 2 wheels;Shower seat;Bedside commode          Prior Functioning/Environment Level of  Independence: Independent             OT Diagnosis: Generalized weakness;Acute pain   OT Problem List: Decreased strength;Decreased activity tolerance;Pain   OT Treatment/Interventions:      OT Goals(Current goals can be found in the care plan section) Acute Rehab OT Goals Patient Stated Goal: to go home OT Goal Formulation:  (eval only)  OT Frequency:     Barriers to D/C:            Co-evaluation              End of Session Equipment Utilized During Treatment: Gait belt;Rolling walker CPM Right Knee CPM Right Knee: Off Nurse Communication: Mobility status  Activity Tolerance: Patient tolerated treatment well Patient left: in chair;with call bell/phone within reach    Time: [id number] OT Time Calculation (min): 17 min Charges:  OT General Charges $OT Visit: 1 Procedure OT Evaluation $Initial OT Evaluation Tier I: 1 Procedure OT Treatments $Self Care/Home Management : 8-22 mins G-Codes:    Jailan Trimm,HILLARY 03/17/2014, 9:58 AM   Luisa DagoHilary Okie Bogacz, OTR/L  [phone] 03/17/2014

## 2014-03-18 LAB — CBC
HEMATOCRIT: 35.3 % — AB (ref 39.0–52.0)
Hemoglobin: 12 g/dL — ABNORMAL LOW (ref 13.0–17.0)
MCH: 28.7 pg (ref 26.0–34.0)
MCHC: 34 g/dL (ref 30.0–36.0)
MCV: 84.4 fL (ref 78.0–100.0)
Platelets: 158 10*3/uL (ref 150–400)
RBC: 4.18 MIL/uL — ABNORMAL LOW (ref 4.22–5.81)
RDW: 14.2 % (ref 11.5–15.5)
WBC: 9.1 10*3/uL (ref 4.0–10.5)

## 2014-03-18 LAB — BASIC METABOLIC PANEL
ANION GAP: 11 (ref 5–15)
BUN: 19 mg/dL (ref 6–23)
CALCIUM: 8.8 mg/dL (ref 8.4–10.5)
CO2: 26 mEq/L (ref 19–32)
CREATININE: 0.92 mg/dL (ref 0.50–1.35)
Chloride: 96 mEq/L (ref 96–112)
GFR calc Af Amer: >90 mL/min (ref >90)
Glucose, Bld: 116 mg/dL — ABNORMAL HIGH (ref 70–99)
Potassium: 4.3 mEq/L (ref 3.7–5.3)
Sodium: 133 mEq/L — ABNORMAL LOW (ref 137–147)

## 2014-03-18 NOTE — Progress Notes (Signed)
Physical Therapy Treatment Patient Details Name: James Boone MRN: [id number] DOB: 12/23/1960 Today's Date: 03/18/2014    History of Present Illness R TKA    PT Comments    Pt progressing well, ready for D/C home today  Follow Up Recommendations  Home health PT     Equipment Recommendations  None recommended by PT    Recommendations for Other Services       Precautions / Restrictions Precautions Precautions: Knee Precaution Comments: I SLR, Ki D/C'd Restrictions Weight Bearing Restrictions: No Other Position/Activity Restrictions: WBAT    Mobility  Bed Mobility Overal bed mobility: Modified Independent                Transfers Overall transfer level: Modified independent Equipment used: Rolling walker (2 wheeled) Transfers: Sit to/from Stand Sit to Stand: Modified independent (Device/Increase time)         General transfer comment: pt demo's correct hand placement  Ambulation/Gait Ambulation/Gait assistance: Min guard;Supervision Ambulation Distance (Feet): 100 Feet (20' more) Assistive device: Rolling walker (2 wheeled) Gait Pattern/deviations: Step-to pattern;Antalgic   Gait velocity interpretation: Below normal speed for age/gender General Gait Details:  initial cues for RW position, step length, pt walks on lateral border  R foot d/t old ankle injury   Stairs Stairs: Yes Stairs assistance: Min guard Stair Management: One rail Left;Forwards;Step to pattern;With crutches Number of Stairs: 4 General stair comments: cues for sequence and technique  Wheelchair Mobility    Modified Rankin (Stroke Patients Only)       Balance                                    Cognition Arousal/Alertness: Awake/alert Behavior During Therapy: WFL for tasks assessed/performed Overall Cognitive Status: Within Functional Limits for tasks assessed                      Exercises Total Joint Exercises Ankle Circles/Pumps: AROM;10  reps;Both Quad Sets: Strengthening;Both;10 reps Gluteal Sets: Strengthening;Both;10 reps Towel Squeeze: AROM;Strengthening;Both;10 reps Heel Slides: AROM;Strengthening;Right;10 reps Straight Leg Raises: AROM;Strengthening;Right;10 reps Goniometric ROM: ~ -10 to 75*    General Comments        Pertinent Vitals/Pain Pain Assessment: 0-10 Pain Score: 2  Pain Location: right knee Pain Descriptors / Indicators: Sore    Home Living                      Prior Function            PT Goals (current goals can now be found in the care plan section) Acute Rehab PT Goals Patient Stated Goal: to go home Time For Goal Achievement: 03/20/14 Potential to Achieve Goals: Good Progress towards PT goals: Progressing toward goals    Frequency  7X/week    PT Plan Current plan remains appropriate    Co-evaluation             End of Session Equipment Utilized During Treatment: Gait belt Activity Tolerance: Patient tolerated treatment well Patient left: in chair;with call bell/phone within reach     Time: 1029-1100 PT Time Calculation (min): 31 min  Charges:  $Gait Training: 8-22 mins $Therapeutic Exercise: 8-22 mins                    G Codes:      Manda Holstad 03/18/2014, 11:12 AM

## 2014-03-18 NOTE — Care Management Note (Signed)
    Page 1 of 2   03/18/2014     8:37:27 AM CARE MANAGEMENT NOTE 03/18/2014  Patient:  James Boone, James Boone   Account Number:  [id number]  Date Initiated:  03/18/2014  Documentation initiated by:  [facility]  Subjective/Objective Assessment:   adm: RIGHT TOTAL KNEE ARTHROPLASTY (Right)     Action/Plan:   discharge planning   Anticipated DC Date:  03/18/2014   Anticipated DC Plan:  [place]  CM consult      [facility] Choice  HOME HEALTH   Choice offered to / List presented to:  C-1 Patient   DME arranged  NA      DME agency  NA     [place] arranged  HH-2 PT      [place]   Status of service:  Completed, signed off Medicare Important Message given?   (If response is "NO", the following Medicare IM given date fields will be blank) Date Medicare IM given:   Medicare IM given by:   Date Additional Medicare IM given:   Additional Medicare IM given by:    Discharge Disposition:  [place]  Per UR Regulation:    If discussed at Long Length of Stay Meetings, dates discussed:    Comments:  1028/25 07:45 Cm met with pt in room to confirm choice of home health agency.  Pt confirms [facility] will render HHPT srevices.  Address and Contact information verified with pt.  No  dme needed as this is his second surgery in 6 mos. Referral called to [facility].  No other CM needs were communicated.  Mariane Masters, BSN, CM [phone].

## 2014-03-18 NOTE — Progress Notes (Signed)
RN reviewed discharge instructions with patient and family. All questions answered.   Paperwork and prescriptions given.   Patient wheeled down in wheelchair to family vehicle.

## 2014-03-18 NOTE — Progress Notes (Signed)
Discharge summary sent to payer through [facility]  

## 2014-03-18 NOTE — Progress Notes (Signed)
   Subjective: 2 Days Post-Op Procedure(s) (LRB): RIGHT TOTAL KNEE ARTHROPLASTY (Right) Patient reports pain as mild.   Patient seen in rounds for Dr. Lequita HaltAluisio. Patient is well, and has had no acute complaints or problems Patient is ready to go home  Objective: Vital signs in last 24 hours: Temp:  [97.6 F (36.4 C)-98.1 F (36.7 C)] 98.1 F (36.7 C) (10/28 0532) Pulse Rate:  [64-72] 65 (10/28 0532) Resp:  [18-20] 18 (10/28 0532) BP: (114-123)/(63-69) 119/68 mmHg (10/28 0532) SpO2:  [95 %-99 %] 95 % (10/28 0532)  Intake/Output from previous day:  Intake/Output Summary (Last 24 hours) at 03/18/14 21300812 Last data filed at 03/18/14 0600  Gross per 24 hour  Intake   1332 ml  Output   2075 ml  Net   -743 ml    Intake/Output this shift:    Labs:  Recent Labs  03/17/14 0406 03/18/14 0125  HGB 12.6* 12.0*    Recent Labs  03/17/14 0406 03/18/14 0125  WBC 10.7* 9.1  RBC 4.43 4.18*  HCT 37.6* 35.3*  PLT 151 158    Recent Labs  03/17/14 0406 03/18/14 0125  NA 135* 133*  K 4.4 4.3  CL 101 96  CO2 26 26  BUN 19 19  CREATININE 0.99 0.92  GLUCOSE 121* 116*  CALCIUM 8.6 8.8   No results found for this basename: LABPT, INR,  in the last 72 hours  EXAM: General - Patient is Alert, Appropriate and Oriented Extremity - Neurovascular intact Sensation intact distally Dorsiflexion/Plantar flexion intact Incision - clean, dry, no drainage, healing Motor Function - intact, moving foot and toes well on exam.   Assessment/Plan: 2 Days Post-Op Procedure(s) (LRB): RIGHT TOTAL KNEE ARTHROPLASTY (Right) Procedure(s) (LRB): RIGHT TOTAL KNEE ARTHROPLASTY (Right) Past Medical History  Diagnosis Date  . Hyperlipidemia   . Depression   . Hypertension   . Hypogonadism male   . ADD (attention deficit disorder)   . Abnormal liver function test   . Allergic rhinitis   . Hyperglycemia   . Arthritis   . Hemorrhoid   . Hypercholesteremia   . Peripheral edema   . Varicose  veins     both legs  . Sleep apnea 2005    dx in 2005 , on CPAP setting of 13  . Back pain     lower  . ADD (attention deficit disorder)    Active Problems:   OA (osteoarthritis) of knee  Estimated body mass index is 36.05 kg/(m^2) as calculated from the following:   Height as of this encounter: 6\' 4"  (1.93 m).   Weight as of this encounter: 134.265 kg (296 lb). Up with therapy Discharge home with home health Diet - Cardiac diet Follow up - in 2 weeks Activity - WBAT Disposition - Home Condition Upon Discharge - Good D/C Meds - See DC Summary DVT Prophylaxis - Xarelto  Avel Peacerew Perkins, PA-C Orthopaedic Surgery 03/18/2014, 8:12 AM

## 2014-04-07 ENCOUNTER — Ambulatory Visit: Payer: 59 | Attending: Physical Therapy | Admitting: Physical Therapy

## 2014-04-07 DIAGNOSIS — Z96652 Presence of left artificial knee joint: Secondary | ICD-10-CM | POA: Insufficient documentation

## 2014-04-07 DIAGNOSIS — M2560 Stiffness of unspecified joint, not elsewhere classified: Secondary | ICD-10-CM | POA: Diagnosis not present

## 2014-04-07 DIAGNOSIS — I10 Essential (primary) hypertension: Secondary | ICD-10-CM | POA: Diagnosis not present

## 2014-04-07 DIAGNOSIS — M25559 Pain in unspecified hip: Secondary | ICD-10-CM | POA: Diagnosis not present

## 2014-04-07 DIAGNOSIS — Z5189 Encounter for other specified aftercare: Secondary | ICD-10-CM | POA: Diagnosis not present

## 2014-04-07 DIAGNOSIS — M25579 Pain in unspecified ankle and joints of unspecified foot: Secondary | ICD-10-CM | POA: Diagnosis not present

## 2014-04-09 ENCOUNTER — Ambulatory Visit: Payer: 59 | Admitting: Physical Therapy

## 2014-04-09 DIAGNOSIS — Z5189 Encounter for other specified aftercare: Secondary | ICD-10-CM | POA: Diagnosis not present

## 2014-04-14 ENCOUNTER — Ambulatory Visit: Payer: 59 | Admitting: Physical Therapy

## 2014-04-14 DIAGNOSIS — Z5189 Encounter for other specified aftercare: Secondary | ICD-10-CM | POA: Diagnosis not present

## 2014-04-20 ENCOUNTER — Ambulatory Visit: Payer: 59 | Admitting: Physical Therapy

## 2014-04-20 DIAGNOSIS — Z5189 Encounter for other specified aftercare: Secondary | ICD-10-CM | POA: Diagnosis not present

## 2014-04-23 ENCOUNTER — Ambulatory Visit: Payer: 59 | Attending: Orthopedic Surgery | Admitting: Physical Therapy

## 2014-04-23 DIAGNOSIS — Z96652 Presence of left artificial knee joint: Secondary | ICD-10-CM | POA: Diagnosis not present

## 2014-04-23 DIAGNOSIS — M25559 Pain in unspecified hip: Secondary | ICD-10-CM | POA: Insufficient documentation

## 2014-04-23 DIAGNOSIS — Z5189 Encounter for other specified aftercare: Secondary | ICD-10-CM | POA: Diagnosis present

## 2014-04-23 DIAGNOSIS — I10 Essential (primary) hypertension: Secondary | ICD-10-CM | POA: Insufficient documentation

## 2014-04-23 DIAGNOSIS — M25579 Pain in unspecified ankle and joints of unspecified foot: Secondary | ICD-10-CM | POA: Insufficient documentation

## 2014-04-23 DIAGNOSIS — M2560 Stiffness of unspecified joint, not elsewhere classified: Secondary | ICD-10-CM | POA: Diagnosis not present

## 2014-04-28 ENCOUNTER — Ambulatory Visit: Payer: 59 | Admitting: Physical Therapy

## 2014-04-28 DIAGNOSIS — Z5189 Encounter for other specified aftercare: Secondary | ICD-10-CM | POA: Diagnosis not present

## 2014-04-30 ENCOUNTER — Ambulatory Visit: Payer: 59 | Admitting: Physical Therapy

## 2014-04-30 DIAGNOSIS — Z5189 Encounter for other specified aftercare: Secondary | ICD-10-CM | POA: Diagnosis not present

## 2014-05-04 ENCOUNTER — Ambulatory Visit: Payer: 59 | Admitting: Physical Therapy

## 2014-05-07 ENCOUNTER — Ambulatory Visit: Payer: 59 | Admitting: Physical Therapy

## 2014-05-08 ENCOUNTER — Ambulatory Visit: Payer: 59 | Admitting: Physical Therapy

## 2014-05-22 DIAGNOSIS — M216X1 Other acquired deformities of right foot: Secondary | ICD-10-CM

## 2014-05-22 HISTORY — PX: TOTAL ANKLE REPLACEMENT: SUR1218

## 2014-05-22 HISTORY — DX: Other acquired deformities of right foot: M21.6X1

## 2014-06-01 ENCOUNTER — Ambulatory Visit: Payer: 59 | Admitting: Internal Medicine

## 2014-07-16 ENCOUNTER — Ambulatory Visit (INDEPENDENT_AMBULATORY_CARE_PROVIDER_SITE_OTHER): Payer: 59 | Admitting: Internal Medicine

## 2014-07-16 ENCOUNTER — Encounter: Payer: Self-pay | Admitting: Internal Medicine

## 2014-07-16 VITALS — BP 129/86 | HR 69 | Temp 98.0°F | Ht 76.0 in | Wt 304.5 lb

## 2014-07-16 DIAGNOSIS — E785 Hyperlipidemia, unspecified: Secondary | ICD-10-CM

## 2014-07-16 DIAGNOSIS — E291 Testicular hypofunction: Secondary | ICD-10-CM

## 2014-07-16 DIAGNOSIS — I10 Essential (primary) hypertension: Secondary | ICD-10-CM

## 2014-07-16 MED ORDER — ATORVASTATIN CALCIUM 10 MG PO TABS: 10.0000 mg | ORAL_TABLET | Freq: Every morning | ORAL | Status: DC

## 2014-07-16 MED ORDER — TESTOSTERONE CYPIONATE 200 MG/ML IM SOLN: INTRAMUSCULAR | Status: DC

## 2014-07-16 NOTE — Progress Notes (Signed)
Subjective:    Patient ID: James Boone, male    DOB: Jan 03, 1961, 54 y.o.   MRN: [id number]  DOS:  07/16/2014 Type of visit - description : rov Interval history: Hypogonadism, not taking testosterone for months High cholesterol, not taking Lipitor for months DJD, status post right knee replacement, doing well Hypertension, ambulatory BPs very good.   Review of Systems Denies chest pain or shortness of breath No nausea, vomiting, diarrhea. I asked the patient he feels any different without testosterone he said "maybe slightly more fatigued".   Past Medical History  Diagnosis Date  . Hyperlipidemia   . Depression   . Hypertension   . Hypogonadism male   . ADD (attention deficit disorder)   . Abnormal liver function test   . Allergic rhinitis   . Hyperglycemia   . Arthritis   . Hemorrhoid   . Hypercholesteremia   . Peripheral edema   . Varicose veins     both legs  . Sleep apnea 2005    dx in 2005 , on CPAP setting of 13  . Back pain     lower  . ADD (attention deficit disorder)     Past Surgical History  Procedure Laterality Date  . Vasectomy  1990  . Knee arthroscopy  05/2008    left, Dr.Geoffrey  . Knee arthroscopy  2008    right, Dr.Geoffrey  . Total knee arthroplasty Left 12/30/2012    Procedure: LEFT TOTAL KNEE ARTHROPLASTY;  Surgeon: Loanne Drilling, MD;  Location: [facility];  Service: Orthopedics;  Laterality: Left;  . Wisdom teeth extractiion    . Hernia repair  2001    abdominal hernia  . Total knee arthroplasty Right 03/16/2014    Procedure: RIGHT TOTAL KNEE ARTHROPLASTY;  Surgeon: Loanne Drilling, MD;  Location: [facility];  Service: Orthopedics;  Laterality: Right;    History   Social History  . Marital Status: Married    Spouse Name: N/A  . Number of Children: 2  . Years of Education: N/A   Occupational History  . [employer]     Social History Main Topics  . Smoking status: Never Smoker   . Smokeless tobacco: Never Used  . Alcohol Use: Yes     Comment: socially   . Drug Use: No  . Sexual Activity: Yes   Other Topics Concern  . Not on file   Social History Narrative   Lives w/ wife, grown chidren        Medication List       This list is accurate as of: 07/16/14  5:45 PM.  Always use your most recent med list.               amphetamine-dextroamphetamine 25 MG 24 hr capsule  Commonly known as:  ADDERALL XR  Take 50 mg by mouth every morning.     atorvastatin 10 MG tablet  Commonly known as:  LIPITOR  Take 1 tablet (10 mg total) by mouth every morning.     buPROPion 150 MG 24 hr tablet  Commonly known as:  WELLBUTRIN XL  Take 150 mg by mouth every morning.     lamoTRIgine 100 MG tablet  Commonly known as:  LAMICTAL  Take 150 mg by mouth every evening.     metoprolol 50 MG tablet  Commonly known as:  LOPRESSOR  Take 50 mg by mouth 2 (two) times daily.     sertraline 100 MG tablet  Commonly known as:  ZOLOFT  Take 200 mg by mouth every morning.     testosterone cypionate 200 MG/ML injection  Commonly known as:  DEPOTESTOTERONE CYPIONATE  INJECT ONE MILLILITER INTRAMUSCULARLY EVERY 2 WEEKS           Objective:   Physical Exam BP 129/86 mmHg  Pulse 69  Temp(Src) 98 F (36.7 C) (Oral)  Ht 6\' 4"  (1.93 m)  Wt 304 lb 8 oz (138.12 kg)  BMI 37.08 kg/m2  SpO2 98% General:   Well developed, well nourished . NAD.  HEENT:  Normocephalic . Face symmetric, atraumatic Lungs:  CTA B Normal respiratory effort, no intercostal retractions, no accessory muscle use. Heart: RRR,  no murmur.  Muscle skeletal: no pretibial edema bilaterally  Skin: Not pale. Not jaundice Neurologic:  alert & oriented X3.  Speech normal, gait appropriate for age and unassisted Psych--  Cognition and judgment appear intact.  Cooperative with normal attention span and concentration.  Behavior appropriate. No anxious or depressed appearing.      Assessment & Plan:

## 2014-07-16 NOTE — Progress Notes (Signed)
Pre visit review using our clinic review tool, if applicable. No additional management support is needed unless otherwise documented below in the visit note. 

## 2014-07-16 NOTE — Assessment & Plan Note (Signed)
Due to a misunderstanding, he stopped taking Lipitor, recommend to go back on it.

## 2014-07-16 NOTE — Assessment & Plan Note (Addendum)
Last testosterone level was quite elevated while  the patient was taking 2 mL  of testosterone every 2 weeks. Due to a misunderstanding he stopped taking testosterone. Plan: Restart testosterone 1 mL every 2 weeks, labs in 2 months

## 2014-07-16 NOTE — Patient Instructions (Addendum)
Please schedule labs to be done in 2 months (testosterone)  Come back by 11-2014 for a   physical exam   Come back fasting

## 2014-07-16 NOTE — Assessment & Plan Note (Signed)
Seems well-controlled, no change 

## 2014-08-03 ENCOUNTER — Other Ambulatory Visit: Payer: Self-pay

## 2014-08-03 MED ORDER — METOPROLOL TARTRATE 50 MG PO TABS: 50.0000 mg | ORAL_TABLET | Freq: Two times a day (BID) | ORAL | Status: DC

## 2014-09-16 ENCOUNTER — Other Ambulatory Visit: Payer: Self-pay

## 2014-09-16 DIAGNOSIS — E291 Testicular hypofunction: Secondary | ICD-10-CM

## 2014-09-18 ENCOUNTER — Other Ambulatory Visit (INDEPENDENT_AMBULATORY_CARE_PROVIDER_SITE_OTHER): Payer: 59

## 2014-09-18 ENCOUNTER — Encounter: Payer: Self-pay | Admitting: Internal Medicine

## 2014-09-18 DIAGNOSIS — E291 Testicular hypofunction: Secondary | ICD-10-CM

## 2014-09-18 LAB — TESTOSTERONE: Testosterone: 112.55 ng/dL — ABNORMAL LOW (ref 300.00–890.00)

## 2014-11-13 ENCOUNTER — Telehealth: Payer: Self-pay | Admitting: Internal Medicine

## 2014-11-13 NOTE — Telephone Encounter (Signed)
pre visit letter mailed 11/13/14 °

## 2014-12-03 ENCOUNTER — Telehealth: Payer: Self-pay | Admitting: Behavioral Health

## 2014-12-03 NOTE — Telephone Encounter (Signed)
Unable to reach patient at time of Pre-Visit Call.  Left message for patient to return call when available.    

## 2014-12-04 ENCOUNTER — Ambulatory Visit (INDEPENDENT_AMBULATORY_CARE_PROVIDER_SITE_OTHER): Payer: 59 | Admitting: Internal Medicine

## 2014-12-04 ENCOUNTER — Encounter: Payer: Self-pay | Admitting: Internal Medicine

## 2014-12-04 VITALS — BP 134/78 | HR 59 | Temp 97.9°F | Ht 76.0 in | Wt 306.1 lb

## 2014-12-04 DIAGNOSIS — M19071 Primary osteoarthritis, right ankle and foot: Secondary | ICD-10-CM

## 2014-12-04 DIAGNOSIS — Z0000 Encounter for general adult medical examination without abnormal findings: Secondary | ICD-10-CM

## 2014-12-04 DIAGNOSIS — E291 Testicular hypofunction: Secondary | ICD-10-CM

## 2014-12-04 LAB — CBC WITH DIFFERENTIAL/PLATELET
BASOS PCT: 0.3 % (ref 0.0–3.0)
Basophils Absolute: 0 10*3/uL (ref 0.0–0.1)
EOS ABS: 0.2 10*3/uL (ref 0.0–0.7)
Eosinophils Relative: 4.4 % (ref 0.0–5.0)
HCT: 45.2 % (ref 39.0–52.0)
HEMOGLOBIN: 14.9 g/dL (ref 13.0–17.0)
Lymphocytes Relative: 27.9 % (ref 12.0–46.0)
Lymphs Abs: 1.4 10*3/uL (ref 0.7–4.0)
MCHC: 33 g/dL (ref 30.0–36.0)
MCV: 84 fl (ref 78.0–100.0)
MONO ABS: 0.4 10*3/uL (ref 0.1–1.0)
MONOS PCT: 8.8 % (ref 3.0–12.0)
Neutro Abs: 2.9 10*3/uL (ref 1.4–7.7)
Neutrophils Relative %: 58.6 % (ref 43.0–77.0)
PLATELETS: 189 10*3/uL (ref 150.0–400.0)
RBC: 5.38 Mil/uL (ref 4.22–5.81)
RDW: 16.5 % — ABNORMAL HIGH (ref 11.5–15.5)
WBC: 5 10*3/uL (ref 4.0–10.5)

## 2014-12-04 LAB — LIPID PANEL
CHOLESTEROL: 144 mg/dL (ref 0–200)
HDL: 53.8 mg/dL (ref >39.00)
LDL Cholesterol: 74 mg/dL (ref 0–99)
NONHDL: 90.2
Total CHOL/HDL Ratio: 3
Triglycerides: 83 mg/dL (ref 0.0–149.0)
VLDL: 16.6 mg/dL (ref 0.0–40.0)

## 2014-12-04 LAB — TESTOSTERONE: Testosterone: 52.76 ng/dL — ABNORMAL LOW (ref 300.00–890.00)

## 2014-12-04 LAB — COMPREHENSIVE METABOLIC PANEL
ALT: 32 U/L (ref 0–53)
AST: 33 U/L (ref 0–37)
Albumin: 4.2 g/dL (ref 3.5–5.2)
Alkaline Phosphatase: 109 U/L (ref 39–117)
BILIRUBIN TOTAL: 0.6 mg/dL (ref 0.2–1.2)
BUN: 24 mg/dL — ABNORMAL HIGH (ref 6–23)
CO2: 25 mEq/L (ref 19–32)
CREATININE: 1.1 mg/dL (ref 0.40–1.50)
Calcium: 9.3 mg/dL (ref 8.4–10.5)
Chloride: 106 mEq/L (ref 96–112)
GFR: 73.63 mL/min (ref >60.00)
Glucose, Bld: 90 mg/dL (ref 70–99)
Potassium: 4.2 mEq/L (ref 3.5–5.1)
SODIUM: 140 meq/L (ref 135–145)
Total Protein: 6.8 g/dL (ref 6.0–8.3)

## 2014-12-04 LAB — TSH: TSH: 2.38 u[IU]/mL (ref 0.35–4.50)

## 2014-12-04 LAB — PSA: PSA: 0.92 ng/mL (ref 0.10–4.00)

## 2014-12-04 MED ORDER — METOPROLOL TARTRATE 50 MG PO TABS: 50.0000 mg | ORAL_TABLET | Freq: Two times a day (BID) | ORAL | Status: DC

## 2014-12-04 MED ORDER — ATORVASTATIN CALCIUM 10 MG PO TABS: 10.0000 mg | ORAL_TABLET | Freq: Every morning | ORAL | Status: DC

## 2014-12-04 NOTE — Progress Notes (Signed)
Subjective:    Patient ID: James Boone, male    DOB: 03-Nov-1960, 54 y.o.   MRN: [id number]  DOS:  12/04/2014 Type of visit - description :  CPX Interval history: In general feeling well   Review of Systems Constitutional: No fever. No chills. No unexplained wt changes. On and off sweats, at baseline for years  HEENT: No dental problems, no ear discharge, no facial swelling, no voice changes. No eye discharge, no eye  redness , no  intolerance to light   Respiratory: No wheezing , no  difficulty breathing. No cough , no mucus production  Cardiovascular: No CP, no palpitations, chronic leg swelling worse on the right at baseline  GI: no nausea, no vomiting, no diarrhea , no  abdominal pain.  No blood in the stools. No dysphagia, no odynophagia    Endocrine: No polyphagia, no polyuria , no polydipsia  GU: No dysuria, gross hematuria, difficulty urinating. No urinary urgency, no frequency.  Musculoskeletal: No joint swellings or unusual aches or pains  Skin: No rash, chronic changes of the skin in the pretibial area actually better  Allergic, immunologic: No environmental allergies , no  food allergies  Neurological: No dizziness no  syncope. No headaches. No diplopia, no slurred, no slurred speech, no motor deficits, no facial  Numbness  Hematological: No enlarged lymph nodes, no easy bruising , no unusual bleedings  Psychiatry: No suicidal ideas, no hallucinations, no beavior problems, no confusion.  No unusual/severe anxiety, no depression    Past Medical History  Diagnosis Date  . Hyperlipidemia   . Depression   . Hypertension   . Hypogonadism male   . ADD (attention deficit disorder)   . Abnormal liver function test   . Allergic rhinitis   . Hyperglycemia   . Arthritis   . Hemorrhoid   . Hypercholesteremia   . Peripheral edema   . Varicose veins     both legs  . Sleep apnea 2005    dx in 2005 , on CPAP setting of 13  . Back pain     lower  . ADD  (attention deficit disorder)     Past Surgical History  Procedure Laterality Date  . Vasectomy  1990  . Knee arthroscopy  05/2008    left, Dr.Geoffrey  . Knee arthroscopy  2008    right, Dr.Geoffrey  . Total knee arthroplasty Left 12/30/2012    Procedure: LEFT TOTAL KNEE ARTHROPLASTY;  Surgeon: Loanne Drilling, MD;  Location: [facility];  Service: Orthopedics;  Laterality: Left;  . Wisdom teeth extractiion    . Hernia repair  2001    abdominal hernia  . Total knee arthroplasty Right 03/16/2014    Procedure: RIGHT TOTAL KNEE ARTHROPLASTY;  Surgeon: Loanne Drilling, MD;  Location: [facility];  Service: Orthopedics;  Laterality: Right;    History   Social History  . Marital Status: Married    Spouse Name: N/A  . Number of Children: 2  . Years of Education: N/A   Occupational History  . [employer]     Social History Main Topics  . Smoking status: Never Smoker   . Smokeless tobacco: Never Used  . Alcohol Use: Yes     Comment: socially   . Drug Use: No  . Sexual Activity: Yes   Other Topics Concern  . Not on file   Social History Narrative   Lives w/ wife, grown chidren     Family History  Problem Relation Age of Onset  .  Depression Sister     no suicide   . Stroke Father     ag ~ 4264  . Coronary artery disease Father     smoker, MI and a CABG at 56 y/o  . Colon cancer Neg Hx   . Prostate cancer Neg Hx   . Diabetes Neg Hx       Medication List       This list is accurate as of: 12/04/14 10:21 AM.  Always use your most recent med list.               amphetamine-dextroamphetamine 25 MG 24 hr capsule  Commonly known as:  ADDERALL XR  Take 50 mg by mouth every morning.     atorvastatin 10 MG tablet  Commonly known as:  LIPITOR  Take 1 tablet (10 mg total) by mouth every morning.     buPROPion 150 MG 24 hr tablet  Commonly known as:  WELLBUTRIN XL  Take 150 mg by mouth every morning.     lamoTRIgine 100 MG tablet  Commonly known as:  LAMICTAL  Take 150 mg  by mouth every evening.     metoprolol 50 MG tablet  Commonly known as:  LOPRESSOR  Take 1 tablet (50 mg total) by mouth 2 (two) times daily.     sertraline 100 MG tablet  Commonly known as:  ZOLOFT  Take 200 mg by mouth every morning.     testosterone cypionate 200 MG/ML injection  Commonly known as:  DEPOTESTOSTERONE CYPIONATE  INJECT ONE MILLILITER INTRAMUSCULARLY EVERY 2 WEEKS           Objective:   Physical Exam BP 134/78 mmHg  Pulse 59  Temp(Src) 97.9 F (36.6 C) (Oral)  Ht 6\' 4"  (1.93 m)  Wt 306 lb 2 oz (138.857 kg)  BMI 37.28 kg/m2  SpO2 96%  General:   Well developed, well nourished . NAD.  Neck:  Full range of motion. Supple. No  thyromegaly , normal carotid pulse HEENT:  Normocephalic . Face symmetric, atraumatic Lungs:  CTA B Normal respiratory effort, no intercostal retractions, no accessory muscle use. Heart: RRR,  no murmur.  + pretibial edema bilaterally worse on the right. Abdomen:  Not distended, soft, non-tender. No rebound or rigidity. No mass,organomegaly Skin: thin and hyperpigmented skin on the pretibial area bilaterally Rectal:  External abnormalities: none. Normal sphincter tone. No rectal masses or tenderness.  Stool brown  Prostate: Prostate gland firm and smooth, no enlargement, nodularity, tenderness, mass, asymmetry or induration.   Neurologic:  alert & oriented X3.  Speech normal, gait appropriate for age and unassisted Strength symmetric and appropriate for age.  Psych: Cognition and judgment appear intact.  Cooperative with normal attention span and concentration.  Behavior appropriate. No anxious or depressed appearing.       Assessment & Plan:    CMP, FLP, CBC, TSH, PSA, HIV. Free testosterone

## 2014-12-04 NOTE — Assessment & Plan Note (Signed)
Last testosterone slightly low, he was recommended to increase testosterone from 1 mL to 1.5 mL every 2 weeks however he didn't. Plan: Check labs

## 2014-12-04 NOTE — Assessment & Plan Note (Signed)
Continue with chronic pain and edema of the right ankle follow-up by orthopedic

## 2014-12-04 NOTE — Patient Instructions (Signed)
Get your blood work before you leave    

## 2014-12-04 NOTE — Assessment & Plan Note (Addendum)
Td 2013 pnm 23-- declined zostavax-- discussed   he has a positive family history of heart dz: on ASA 08-2009 colonoscopy-- normal, next 10 years  prostate cancer screening: On HRT, DRE negative, checking a PSA Discussed healthy diet, exercise

## 2014-12-04 NOTE — Progress Notes (Signed)
Pre visit review using our clinic review tool, if applicable. No additional management support is needed unless otherwise documented below in the visit note. 

## 2014-12-05 LAB — HIV ANTIBODY (ROUTINE TESTING W REFLEX): HIV: NONREACTIVE

## 2014-12-05 LAB — TESTOSTERONE, FREE

## 2014-12-05 LAB — TESTOSTERONE, % FREE

## 2014-12-08 LAB — TESTOSTERONE, FREE, TOTAL, SHBG
SEX HORMONE BINDING: 41 nmol/L (ref 10–50)
Testosterone, Free: 10.8 pg/mL — ABNORMAL LOW (ref 47.0–244.0)
Testosterone-% Free: 1.6 % (ref 1.6–2.9)
Testosterone: 68 ng/dL — ABNORMAL LOW (ref 300–890)

## 2014-12-09 MED ORDER — TESTOSTERONE CYPIONATE 200 MG/ML IM SOLN: INTRAMUSCULAR | Status: DC

## 2014-12-09 NOTE — Addendum Note (Signed)
Addended by: Dorette GrateFAULKNER, Garold Sheeler C on: 12/09/2014 04:48 PM   Modules accepted: Orders

## 2015-02-11 ENCOUNTER — Telehealth: Payer: Self-pay | Admitting: Internal Medicine

## 2015-02-11 DIAGNOSIS — G4733 Obstructive sleep apnea (adult) (pediatric): Secondary | ICD-10-CM

## 2015-02-11 NOTE — Telephone Encounter (Signed)
Spoke with Pt, he was calling to see if he can get a new order for a new CPAP machine with supplies. He informed me that the machine he has is making a "funny noise." He informed me that he has called [facility] and was quoted a price of $400 odd dollars to send it in to see if the machine can be fixed. Pt is requesting a new CPAP and supplies. Informed him I would contact [facility] to see what I can do. Pt verbalized understanding.

## 2015-02-11 NOTE — Telephone Encounter (Signed)
Caller name: Lukasz Rogus  Relationship to patient: Self  Can be reached:[phone] Pharmacy:  Reason for call: pt request call back to discuss previous visit.

## 2015-02-11 NOTE — Telephone Encounter (Signed)
CPAP Supplies  Received: Today    James Boone, CMA  James Boone            Good Afternoon,  I received a call from James Boone regarding his CPAP machine. He was informing me that the machine he has now is making "a funny noise" and has been quoted a price of $400 odd dollars to have it sent in and fixed. How can we go about getting another CPAP machine ordered for him with supplies? Can you fax an order over for Dr. Drue Boone just to sign? Pt's last OV with [place] was December 04, 2014. The patient can be reached at [phone] if further information is needed from him.   Thanks,  James Boone        Message sent to [facility] Representative James Boone. Awaiting reply.

## 2015-02-12 NOTE — Telephone Encounter (Signed)
James Boone  Dorette Grate, CMA            I just ned an order for a replacement unit which you guys can put into Epic. It's called "For Home Use Only DME - CPAP." Please fill in all required fields and be sure to add the pressure setting.  Once we get the order, we can submit it with the last office visit note to his insurance.  Let me know when the order is in Epic and I can pull it. Also let me know if I can help.  Thanks      Per Pt's CPAP Setup with Resmed S9 Elite, Pressure of 13 cmh2o, has a Quattro FX Full face mask large on April 01, 2010.

## 2015-02-12 NOTE — Telephone Encounter (Signed)
CPAP ordered. Will inform Melissa Stenson and Pt.

## 2015-02-12 NOTE — Telephone Encounter (Signed)
LMOM informing Pt that new CPAP and supplies have been ordered through [facility]. Informed him to call if he has any questions or concerns.

## 2015-06-07 ENCOUNTER — Encounter: Payer: Self-pay | Admitting: Internal Medicine

## 2015-06-07 ENCOUNTER — Ambulatory Visit (INDEPENDENT_AMBULATORY_CARE_PROVIDER_SITE_OTHER): Payer: 59 | Admitting: Internal Medicine

## 2015-06-07 VITALS — BP 136/78 | HR 65 | Temp 97.8°F | Ht 76.0 in | Wt 309.2 lb

## 2015-06-07 DIAGNOSIS — I10 Essential (primary) hypertension: Secondary | ICD-10-CM | POA: Diagnosis not present

## 2015-06-07 DIAGNOSIS — E291 Testicular hypofunction: Secondary | ICD-10-CM

## 2015-06-07 NOTE — Progress Notes (Signed)
Subjective:    Patient ID: James Boone, male    DOB: [birth year], 55 y.o.   MRN: [id number]  DOS:  06/07/2015 Type of visit - description :  Routine office visit Interval history: Good compliance with medication, using testosterone 1.5 ML every 3 weeks. recovering from ankle surgery  Review of Systems Denies fever chills No chest pain, difficulty breathing. Lower extremity edema, some at the R leg  (site of surgery) none on the left.  Past Medical History  Diagnosis Date  . Hyperlipidemia   . Depression   . Hypertension   . Hypogonadism male   . ADD (attention deficit disorder)   . Abnormal liver function test   . Allergic rhinitis   . Hyperglycemia   . Arthritis   . Hemorrhoid   . Hypercholesteremia   . Peripheral edema   . Varicose veins     both legs  . Sleep apnea 2005    dx in 2005 , on CPAP setting of 13  . Back pain     lower  . ADD (attention deficit disorder)   . Acquired cavovarus deformity of right foot 2016    Dr. Victorino Dike, using boot modification and [place] brace    Past Surgical History  Procedure Laterality Date  . Vasectomy  1990  . Knee arthroscopy  05/2008    left, Dr.Geoffrey  . Knee arthroscopy  2008    right, Dr.Geoffrey  . Total knee arthroplasty Left 12/30/2012    Procedure: LEFT TOTAL KNEE ARTHROPLASTY;  Surgeon: Loanne Drilling, MD;  Location: [facility];  Service: Orthopedics;  Laterality: Left;  . Wisdom teeth extractiion    . Hernia repair  2001    abdominal hernia  . Total knee arthroplasty Right 03/16/2014    Procedure: RIGHT TOTAL KNEE ARTHROPLASTY;  Surgeon: Loanne Drilling, MD;  Location: [facility];  Service: Orthopedics;  Laterality: Right;  . Total ankle replacement Right 2016    Social History   Social History  . Marital Status: Married    Spouse Name: N/A  . Number of Children: 2  . Years of Education: N/A   Occupational History  . [employer]     Social History Main Topics  . Smoking status: Never Smoker   . Smokeless  tobacco: Never Used  . Alcohol Use: Yes     Comment: socially   . Drug Use: No  . Sexual Activity: Yes   Other Topics Concern  . Not on file   Social History Narrative   Lives w/ wife, grown chidren        Medication List       This list is accurate as of: 06/07/15 11:59 PM.  Always use your most recent med list.               amphetamine-dextroamphetamine 25 MG 24 hr capsule  Commonly known as:  ADDERALL XR  Take 50 mg by mouth every morning.     atorvastatin 10 MG tablet  Commonly known as:  LIPITOR  Take 1 tablet (10 mg total) by mouth every morning.     buPROPion 150 MG 24 hr tablet  Commonly known as:  WELLBUTRIN XL  Take 150 mg by mouth every morning.     lamoTRIgine 100 MG tablet  Commonly known as:  LAMICTAL  Take 150 mg by mouth every evening.     metoprolol 50 MG tablet  Commonly known as:  LOPRESSOR  Take 1 tablet (50 mg total) by mouth 2 (  two) times daily.     sertraline 100 MG tablet  Commonly known as:  ZOLOFT  Take 200 mg by mouth every morning.     testosterone cypionate 200 MG/ML injection  Commonly known as:  DEPOTESTOSTERONE CYPIONATE  Inject 1.5 mL intramuscularly every 2 weeks.           Objective:   Physical Exam BP 136/78 mmHg  Pulse 65  Temp(Src) 97.8 F (36.6 C) (Oral)  Ht 6\' 4"  (1.93 m)  Wt 309 lb 4 oz (140.275 kg)  BMI 37.66 kg/m2  SpO2 93% General:   Well developed, well nourished . NAD.  HEENT:  Normocephalic . Face symmetric, atraumatic Lungs:  CTA B Normal respiratory effort, no intercostal retractions, no accessory muscle use. Heart: RRR,  no murmur.  No pretibial edema on the L  Skin: Not pale. Not jaundice Neurologic:  alert & oriented X3.  Speech normal, gait appropriate for age and unassisted Psych--  Cognition and judgment appear intact.  Cooperative with normal attention span and concentration.  Behavior appropriate. No anxious or depressed appearing.      Assessment & Plan:    Assessment Hyperglycemia  HTN-- lopressor Hyperlipidemia-- lipitor  Elevated LFTs Edema DJD OSA -- on CPAP Hypogonadism -- f/u pcp Depression, ADD Dr. Nolen MuMcKinney  PLAN:  HTN: Well-controlled Hyperlipidemia, continue with Lipitor, last FLP satisfactory Hypogonadism:: Testosterone 1.5 ML every 2 weeks, check labs RTC 11-2015, CPX

## 2015-06-07 NOTE — Patient Instructions (Addendum)
BEFORE YOU LEAVE THE OFFICE:  GO TO THE LAB  Get the blood work    GO TO THE FRONT DESK Schedule a complete physical exam to be done by 11-2015 Please be fastingc

## 2015-06-07 NOTE — Progress Notes (Signed)
Pre visit review using our clinic review tool, if applicable. No additional management support is needed unless otherwise documented below in the visit note. 

## 2015-06-09 LAB — TESTOS,TOTAL,FREE AND SHBG (FEMALE)
Sex Hormone Binding Glob.: 37 nmol/L (ref 22–77)
TESTOSTERONE,TOTAL,LC/MS/MS: 1426 ng/dL — AB (ref 250–1100)
Testosterone, Free: 213.9 pg/mL — ABNORMAL HIGH (ref 35.0–155.0)

## 2015-06-11 NOTE — Addendum Note (Signed)
Addended by: Conrad Red Cloud D on: 06/11/2015 11:12 AM   Modules accepted: Orders, Medications

## 2015-07-02 ENCOUNTER — Other Ambulatory Visit: Payer: Self-pay | Admitting: Internal Medicine

## 2015-07-02 NOTE — Telephone Encounter (Signed)
Rx faxed to [facility]  

## 2015-08-10 ENCOUNTER — Other Ambulatory Visit (INDEPENDENT_AMBULATORY_CARE_PROVIDER_SITE_OTHER): Payer: 59

## 2015-08-10 DIAGNOSIS — E291 Testicular hypofunction: Secondary | ICD-10-CM | POA: Diagnosis not present

## 2015-08-13 LAB — TESTOS,TOTAL,FREE AND SHBG (FEMALE)
Sex Hormone Binding Glob.: 34 nmol/L (ref 22–77)
Testosterone, Free: 248.7 pg/mL — ABNORMAL HIGH (ref 35.0–155.0)
Testosterone,Total,LC/MS/MS: 1397 ng/dL — ABNORMAL HIGH (ref 250–1100)

## 2015-09-24 ENCOUNTER — Other Ambulatory Visit: Payer: Self-pay | Admitting: Internal Medicine

## 2015-09-24 NOTE — Telephone Encounter (Signed)
Rx sent to the pharmacy by e-script.//AB/CMA 

## 2015-10-22 ENCOUNTER — Other Ambulatory Visit: Payer: Self-pay | Admitting: Internal Medicine

## 2015-10-22 NOTE — Telephone Encounter (Signed)
Pt is requesting refill on Testosterone inj.  Last OV: 06/07/2015 Last Fill: 07/02/2015 #2420mL and 1 RF  Okay to refill?

## 2015-10-24 NOTE — Telephone Encounter (Signed)
Okay to refill, he uses 2  mLs q 2 weeks

## 2015-10-25 NOTE — Telephone Encounter (Signed)
Discussed below w/ Dr. Drue NovelPaz:   Notes Recorded by Conrad BurlingtonKaylyn Alithia Zavaleta, CMA on 08/18/2015 at 11:58 AM Letter printed and mailed to Pt. Notes Recorded by Conrad BurlingtonKaylyn Senovia Gauer, CMA on 08/16/2015 at 11:42 AM [facility] to return call regarding lab results. Notes Recorded by Wanda PlumpJose E Paz, MD on 08/16/2015 at 11:10 AM Correction: Level is slightly high, recommend to decrease dose to 1 mL every 2 weeks. Notes Recorded by Wanda PlumpJose E Paz, MD on 08/16/2015 at 11:01 AM Advise patient: T is low ---> Increased testosterone to 2 mL's injection every 2 weeks   Correct dosage 1mL every 2 weeks.

## 2015-10-25 NOTE — Telephone Encounter (Signed)
Rx printed, awaiting MD signature.  

## 2015-10-25 NOTE — Telephone Encounter (Signed)
Rx faxed to [facility]  

## 2015-12-08 ENCOUNTER — Encounter: Payer: 59 | Admitting: Internal Medicine

## 2016-02-04 ENCOUNTER — Telehealth: Payer: Self-pay | Admitting: Internal Medicine

## 2016-02-04 MED ORDER — METOPROLOL TARTRATE 50 MG PO TABS: 50.0000 mg | ORAL_TABLET | Freq: Two times a day (BID) | ORAL | 0 refills | Status: DC

## 2016-02-04 MED ORDER — ATORVASTATIN CALCIUM 10 MG PO TABS: 10.0000 mg | ORAL_TABLET | Freq: Every morning | ORAL | 0 refills | Status: DC

## 2016-02-04 NOTE — Telephone Encounter (Signed)
Self. Refill for atorvastatin, metoprolol      Pharmacy: [facility] [address] - [place], [place] - [address]

## 2016-02-04 NOTE — Telephone Encounter (Signed)
90 day supply sent. Pt overdue for CPE. No further refills w/o appt.

## 2016-02-04 NOTE — Telephone Encounter (Signed)
Called pt. lvm informing pt of refill and also to schedule his CPE per PCP.

## 2016-02-06 ENCOUNTER — Encounter: Payer: Self-pay | Admitting: Internal Medicine

## 2016-03-01 MED FILL — DEXTROAMP-AMPHET ER 25 MG C: 25 | 30 days supply | Qty: 60 | Fill #0

## 2016-03-02 MED FILL — lamoTRIgine 200 MG TABS: 200 | 90 days supply | Qty: 135 | Fill #0

## 2016-03-02 MED FILL — BUPROPION HCL XL 150 MG TAB: 150 | 90 days supply | Qty: 90 | Fill #0

## 2016-03-20 MED FILL — SERTRALINE HCL 100 MG TAB: 100 | 90 days supply | Qty: 180 | Fill #0

## 2016-03-20 MED FILL — ATORVASTATIN 10 MG TABLET: 10 | 60 days supply | Qty: 60 | Fill #0

## 2016-04-04 MED FILL — DEXTROAMP-AMPHET ER 25 MG C: 25 | 30 days supply | Qty: 60 | Fill #0

## 2016-04-17 ENCOUNTER — Encounter: Payer: Self-pay | Admitting: Internal Medicine

## 2016-04-18 ENCOUNTER — Encounter: Payer: Self-pay | Admitting: Internal Medicine

## 2016-04-19 DIAGNOSIS — Z471 Aftercare following joint replacement surgery: Secondary | ICD-10-CM | POA: Diagnosis not present

## 2016-04-19 DIAGNOSIS — Z96661 Presence of right artificial ankle joint: Secondary | ICD-10-CM | POA: Diagnosis not present

## 2016-04-19 DIAGNOSIS — M7989 Other specified soft tissue disorders: Secondary | ICD-10-CM | POA: Diagnosis not present

## 2016-04-19 DIAGNOSIS — M19171 Post-traumatic osteoarthritis, right ankle and foot: Secondary | ICD-10-CM | POA: Diagnosis not present

## 2016-04-20 ENCOUNTER — Telehealth: Payer: Self-pay | Admitting: Internal Medicine

## 2016-04-20 DIAGNOSIS — E291 Testicular hypofunction: Secondary | ICD-10-CM

## 2016-04-20 DIAGNOSIS — E785 Hyperlipidemia, unspecified: Secondary | ICD-10-CM

## 2016-04-20 NOTE — Telephone Encounter (Signed)
Patient message, he has no insurance and unable to come for office visit but he is willing to get labs. Ask about medication compliance, if he has been taking Lipitor consistently then scheduled BMP, AST, ALT, FLP fasting , DX high cholesterol. If he has been taking testosterone supplements consistently schedule a free and total testosterone checked. At some point we'll have to schedule a visit

## 2016-04-20 NOTE — Telephone Encounter (Signed)
MyChart message sent.     Labs ordered

## 2016-04-24 DIAGNOSIS — F900 Attention-deficit hyperactivity disorder, predominantly inattentive type: Secondary | ICD-10-CM | POA: Diagnosis not present

## 2016-04-24 DIAGNOSIS — F331 Major depressive disorder, recurrent, moderate: Secondary | ICD-10-CM | POA: Diagnosis not present

## 2016-04-24 DIAGNOSIS — F411 Generalized anxiety disorder: Secondary | ICD-10-CM | POA: Diagnosis not present

## 2016-04-24 MED ORDER — METOPROLOL TARTRATE 50 MG PO TABS: 50.0000 mg | ORAL_TABLET | Freq: Two times a day (BID) | ORAL | 0 refills | Status: DC

## 2016-04-24 NOTE — Telephone Encounter (Signed)
Send metoprolol for 3 months  Received: Today  Message Contents  Wanda PlumpJose E Paz, MD  Conrad BurlingtonKaylyn Broly Hatfield, CMA

## 2016-05-05 MED FILL — DEXTROAMP-AMPHET ER 25 MG C: 25 | 30 days supply | Qty: 60 | Fill #0

## 2016-05-19 ENCOUNTER — Encounter: Payer: Self-pay | Admitting: Internal Medicine

## 2016-05-19 ENCOUNTER — Other Ambulatory Visit: Payer: Self-pay | Admitting: Internal Medicine

## 2016-05-19 MED ORDER — METOPROLOL TARTRATE 50 MG PO TABS: 50.0000 mg | ORAL_TABLET | Freq: Two times a day (BID) | ORAL | 0 refills | Status: DC

## 2016-05-19 MED FILL — METOPROLOL TARTRATE 50 MG T: 50 | 90 days supply | Qty: 180 | Fill #0

## 2016-05-26 ENCOUNTER — Other Ambulatory Visit: Payer: Self-pay | Admitting: Internal Medicine

## 2016-05-26 MED FILL — ATORVASTATIN 10 MG TABLET: 10 | 30 days supply | Qty: 30 | Fill #0

## 2016-05-26 MED FILL — BUPROPION HCL XL 150 MG TAB: 150 | 90 days supply | Qty: 90 | Fill #1

## 2016-05-26 NOTE — Telephone Encounter (Signed)
Pt still has not completed lab work which was agreed upon w/ Dr. Drue NovelPaz in 03/2016. Informed Pt via MyChart that I can only refill 30 day's until he has come into office for labs. Instructed him to call office to schedule lab appt.

## 2016-06-06 ENCOUNTER — Other Ambulatory Visit (INDEPENDENT_AMBULATORY_CARE_PROVIDER_SITE_OTHER): Payer: 59

## 2016-06-06 DIAGNOSIS — E785 Hyperlipidemia, unspecified: Secondary | ICD-10-CM | POA: Diagnosis not present

## 2016-06-06 DIAGNOSIS — E291 Testicular hypofunction: Secondary | ICD-10-CM | POA: Diagnosis not present

## 2016-06-06 LAB — BASIC METABOLIC PANEL
BUN: 18 mg/dL (ref 6–23)
CHLORIDE: 102 meq/L (ref 96–112)
CO2: 32 meq/L (ref 19–32)
CREATININE: 1.07 mg/dL (ref 0.40–1.50)
Calcium: 9.7 mg/dL (ref 8.4–10.5)
GFR: 75.61 mL/min (ref >60.00)
GLUCOSE: 93 mg/dL (ref 70–99)
Potassium: 4.6 mEq/L (ref 3.5–5.1)
Sodium: 137 mEq/L (ref 135–145)

## 2016-06-06 LAB — LIPID PANEL
Cholesterol: 165 mg/dL (ref 0–200)
HDL: 55.3 mg/dL (ref >39.00)
LDL Cholesterol: 72 mg/dL (ref 0–99)
NONHDL: 109.93
Total CHOL/HDL Ratio: 3
Triglycerides: 189 mg/dL — ABNORMAL HIGH (ref 0.0–149.0)
VLDL: 37.8 mg/dL (ref 0.0–40.0)

## 2016-06-06 LAB — AST: AST: 29 U/L (ref 0–37)

## 2016-06-06 LAB — ALT: ALT: 33 U/L (ref 0–53)

## 2016-06-08 LAB — TESTOS,TOTAL,FREE AND SHBG (FEMALE)
SEX HORMONE BINDING GLOB.: 40 nmol/L (ref 22–77)
Testosterone, Free: 35.3 pg/mL (ref 35.0–155.0)
Testosterone,Total,LC/MS/MS: 242 ng/dL — ABNORMAL LOW (ref 250–1100)

## 2016-06-09 MED FILL — ADDERALL XR 25 MG CAPSULE: 25 | 30 days supply | Qty: 60 | Fill #0

## 2016-06-13 ENCOUNTER — Telehealth: Payer: Self-pay | Admitting: Internal Medicine

## 2016-06-13 DIAGNOSIS — E291 Testicular hypofunction: Secondary | ICD-10-CM

## 2016-06-13 NOTE — Telephone Encounter (Signed)
Advise patient, labs okay except testosterone is slightly elevated. Decrease testosterone to 1 mL every 2 weeks. Schedule labs to be done in 2 months. Free-total testosterone  Schedule office visit at his earliest convenience (he doesn't have insurance that may be a problem

## 2016-06-13 NOTE — Telephone Encounter (Signed)
MyChart message sent to Pt. Med list updated and Free-total Testosterone ordered for 2 months.

## 2016-06-15 NOTE — Addendum Note (Signed)
Addended byConrad Herman: Cutler Sunday D on: 06/15/2016 09:53 AM   Modules accepted: Orders

## 2016-06-16 ENCOUNTER — Encounter: Payer: Self-pay | Admitting: Internal Medicine

## 2016-06-16 DIAGNOSIS — E291 Testicular hypofunction: Secondary | ICD-10-CM

## 2016-06-19 ENCOUNTER — Telehealth: Payer: Self-pay

## 2016-06-19 MED ORDER — TESTOSTERONE CYPIONATE 200 MG/ML IM SOLN: 200.0000 mg | INTRAMUSCULAR | 0 refills | Status: DC

## 2016-06-19 NOTE — Telephone Encounter (Signed)
PA initiated via Covermymeds; KEY: [id number]. Awaiting determination.

## 2016-06-19 NOTE — Telephone Encounter (Signed)
Rx faxed to [facility].  

## 2016-06-19 NOTE — Telephone Encounter (Signed)
Rx printed, awaiting MD signature.  

## 2016-06-19 NOTE — Telephone Encounter (Signed)
RX  Received: Today  Message Contents  Wanda PlumpJose E Paz, MD  Conrad BurlingtonKaylyn Metzli Pollick, CMA        Send a prescription for testosterone, 1 mL every 2 weeks.  3 Months and one refill  Enter future labs, free and total testosterone to be done in 2 months

## 2016-06-22 MED FILL — TESTOSTERONE CYP 200 MG/ML: 200 | 84 days supply | Qty: 6 | Fill #0

## 2016-06-22 NOTE — Telephone Encounter (Signed)
Received fax confirmation 06/22/2016 at 0823.

## 2016-06-22 NOTE — Telephone Encounter (Signed)
Received request for further information from [employer]. Form completed and faxed to [phone]. Form sent for scanning.

## 2016-06-23 MED FILL — SERTRALINE HCL 100 MG TAB: 100 | 60 days supply | Qty: 120 | Fill #1

## 2016-06-23 MED FILL — lamoTRIgine 200 MG TABS: 200 | 90 days supply | Qty: 135 | Fill #1

## 2016-06-26 NOTE — Telephone Encounter (Signed)
Received PA approval. [facility] informed of approval. Letter sent for scanning.

## 2016-07-11 ENCOUNTER — Other Ambulatory Visit: Payer: Self-pay | Admitting: Internal Medicine

## 2016-07-11 MED FILL — ATORVASTATIN 10 MG TABLET: 10 | 30 days supply | Qty: 30 | Fill #0

## 2016-07-11 MED FILL — ADDERALL XR 25 MG CAPSULE: 25 | 30 days supply | Qty: 60 | Fill #0

## 2016-07-28 DIAGNOSIS — H524 Presbyopia: Secondary | ICD-10-CM | POA: Diagnosis not present

## 2016-08-15 MED FILL — ADDERALL XR 25 MG CAPSULE: 25 | 30 days supply | Qty: 60 | Fill #0

## 2016-08-25 MED FILL — SERTRALINE HCL 100 MG TAB: 100 | 90 days supply | Qty: 180 | Fill #0

## 2016-08-25 MED FILL — BUPROPION XL 150 MG TAB: 150 | 90 days supply | Qty: 90 | Fill #0

## 2016-09-05 ENCOUNTER — Other Ambulatory Visit: Payer: Self-pay | Admitting: Family Medicine

## 2016-09-05 MED FILL — ATORVASTATIN 10 MG TABLET: 10 | 30 days supply | Qty: 30 | Fill #1

## 2016-10-03 ENCOUNTER — Encounter: Payer: Self-pay | Admitting: Internal Medicine

## 2016-10-03 ENCOUNTER — Ambulatory Visit (INDEPENDENT_AMBULATORY_CARE_PROVIDER_SITE_OTHER): Payer: 59 | Admitting: Internal Medicine

## 2016-10-03 VITALS — BP 128/80 | HR 72 | Temp 98.2°F | Resp 14 | Ht 76.0 in | Wt 310.0 lb

## 2016-10-03 DIAGNOSIS — E291 Testicular hypofunction: Secondary | ICD-10-CM | POA: Diagnosis not present

## 2016-10-03 DIAGNOSIS — Z1159 Encounter for screening for other viral diseases: Secondary | ICD-10-CM | POA: Diagnosis not present

## 2016-10-03 DIAGNOSIS — R739 Hyperglycemia, unspecified: Secondary | ICD-10-CM | POA: Diagnosis not present

## 2016-10-03 NOTE — Progress Notes (Signed)
Subjective:    Patient ID: James Boone, male    DOB: 03/15/1961, 56 y.o.   MRN: [id number]  DOS:  10/03/2016 Type of visit - description : Last office visit 05-2015 Interval history: Routine checkup, feeling well. Good compliance with medication. No major concerns today.    Review of Systems Denies chest pain or difficulty breathing. No dysuria, gross hematuria difficulty urinating.  Past Medical History:  Diagnosis Date  . Abnormal liver function test   . Acquired cavovarus deformity of right foot 2016   Dr. Victorino Dike, using boot modification and [place] brace  . ADD (attention deficit disorder)   . ADD (attention deficit disorder)   . Allergic rhinitis   . Arthritis   . Back pain    lower  . Depression   . Hemorrhoid   . Hypercholesteremia   . Hyperglycemia   . Hyperlipidemia   . Hypertension   . Hypogonadism male   . Peripheral edema   . Sleep apnea 2005   dx in 2005 , on CPAP setting of 13  . Varicose veins    both legs    Past Surgical History:  Procedure Laterality Date  . HERNIA REPAIR  2001   abdominal hernia  . KNEE ARTHROSCOPY  05/2008   left, Dr.Geoffrey  . KNEE ARTHROSCOPY  2008   right, Dr.Geoffrey  . TOTAL ANKLE REPLACEMENT Right 2016  . TOTAL KNEE ARTHROPLASTY Left 12/30/2012   Procedure: LEFT TOTAL KNEE ARTHROPLASTY;  Surgeon: Loanne Drilling, MD;  Location: [facility];  Service: Orthopedics;  Laterality: Left;  . TOTAL KNEE ARTHROPLASTY Right 03/16/2014   Procedure: RIGHT TOTAL KNEE ARTHROPLASTY;  Surgeon: Loanne Drilling, MD;  Location: [facility];  Service: Orthopedics;  Laterality: Right;  Marland Kitchen VASECTOMY  1990  . wisdom teeth extractiion      Social History   Social History  . Marital status: Married    Spouse name: N/A  . Number of children: 2  . Years of education: N/A   Occupational History  . [employer]     Social History Main Topics  . Smoking status: Never Smoker  . Smokeless tobacco: Never Used  . Alcohol use Yes     Comment:  socially   . Drug use: No  . Sexual activity: Yes   Other Topics Concern  . Not on file   Social History Narrative   Lives w/ wife, grown chidren      Allergies as of 10/03/2016   No Known Allergies     Medication List       Accurate as of 10/03/16 11:59 PM. Always use your most recent med list.          amphetamine-dextroamphetamine 25 MG 24 hr capsule Commonly known as:  ADDERALL XR Take 50 mg by mouth every morning.   atorvastatin 10 MG tablet Commonly known as:  LIPITOR Take 1 tablet (10 mg total) by mouth daily.   buPROPion 150 MG 24 hr tablet Commonly known as:  WELLBUTRIN XL Take 150 mg by mouth every morning.   lamoTRIgine 100 MG tablet Commonly known as:  LAMICTAL Take 150 mg by mouth every evening.   metoprolol tartrate 50 MG tablet Commonly known as:  LOPRESSOR Take 1 tablet (50 mg total) by mouth 2 (two) times daily.   sertraline 100 MG tablet Commonly known as:  ZOLOFT Take 200 mg by mouth every morning.   testosterone cypionate 200 MG/ML injection Commonly known as:  DEPOTESTOSTERONE CYPIONATE Inject 1 mL (200 mg  total) into the muscle every 14 (fourteen) days.          Objective:   Physical Exam BP 128/80 (BP Location: Left Arm, Patient Position: Sitting, Cuff Size: Normal)   Pulse 72   Temp 98.2 F (36.8 C) (Oral)   Resp 14   Ht 6\' 4"  (1.93 m)   Wt (!) 310 lb (140.6 kg)   SpO2 97%   BMI 37.73 kg/m  General:   Well developed, well nourished . NAD.  HEENT:  Normocephalic . Face symmetric, atraumatic Lungs:  CTA B Normal respiratory effort, no intercostal retractions, no accessory muscle use. Heart: RRR,  no murmur.  No pretibial edema bilaterally  Rectal:  External abnormalities: none. Normal sphincter tone. No rectal masses or tenderness.  No stools found Prostate: Prostate gland firm and smooth, no enlargement, nodularity, tenderness, mass, asymmetry or induration.  Neurologic:  alert & oriented X3.  Speech normal, gait  appropriate for age and unassisted Psych--  Cognition and judgment appear intact.  Cooperative with normal attention span and concentration.  Behavior appropriate. No anxious or depressed appearing.      Assessment & Plan:    Assessment Hyperglycemia  HTN-- lopressor Hyperlipidemia-- lipitor  Elevated LFTs Edema DJD OSA -- on CPAP Hypogonadism -- f/u pcp Depression, ADD. Used to see Dr. Nolen MuMcKinney, will tranfer to [facility]  PLAN:  Hyperglycemia: Check A1c Hyperlipidemia: On Lipitor, last FLP, AST ALT satisfactory Hypogonadism: On testosterone, 1 mL every 2 weeks, last shot 10 days ago. DRE normal today. Check a PSA, CBC and testosterone level. RTC 6-8 months, CPX

## 2016-10-03 NOTE — Patient Instructions (Signed)
GO TO THE LAB : Get the blood work     GO TO THE FRONT DESK Schedule your next appointment for a  physical exam 6-8 months from today

## 2016-10-03 NOTE — Progress Notes (Signed)
Pre visit review using our clinic review tool, if applicable. No additional management support is needed unless otherwise documented below in the visit note. 

## 2016-10-04 DIAGNOSIS — Z09 Encounter for follow-up examination after completed treatment for conditions other than malignant neoplasm: Secondary | ICD-10-CM | POA: Insufficient documentation

## 2016-10-04 LAB — CBC WITH DIFFERENTIAL/PLATELET
Basophils Absolute: 0.1 10*3/uL (ref 0.0–0.1)
Basophils Relative: 0.9 % (ref 0.0–3.0)
EOS PCT: 3.8 % (ref 0.0–5.0)
Eosinophils Absolute: 0.2 10*3/uL (ref 0.0–0.7)
HCT: 46.9 % (ref 39.0–52.0)
HEMOGLOBIN: 15.4 g/dL (ref 13.0–17.0)
Lymphocytes Relative: 24.8 % (ref 12.0–46.0)
Lymphs Abs: 1.6 10*3/uL (ref 0.7–4.0)
MCHC: 32.7 g/dL (ref 30.0–36.0)
MCV: 83.4 fl (ref 78.0–100.0)
MONOS PCT: 10.3 % (ref 3.0–12.0)
Monocytes Absolute: 0.7 10*3/uL (ref 0.1–1.0)
Neutro Abs: 3.8 10*3/uL (ref 1.4–7.7)
Neutrophils Relative %: 60.2 % (ref 43.0–77.0)
Platelets: 244 10*3/uL (ref 150.0–400.0)
RBC: 5.62 Mil/uL (ref 4.22–5.81)
RDW: 16.7 % — ABNORMAL HIGH (ref 11.5–15.5)
WBC: 6.4 10*3/uL (ref 4.0–10.5)

## 2016-10-04 LAB — HEPATITIS C ANTIBODY: HCV Ab: NEGATIVE

## 2016-10-04 LAB — PSA: PSA: 1.11 ng/mL (ref 0.10–4.00)

## 2016-10-04 LAB — HEMOGLOBIN A1C: HEMOGLOBIN A1C: 5.9 % (ref 4.6–6.5)

## 2016-10-04 NOTE — Assessment & Plan Note (Signed)
Hyperglycemia: Check A1c Hyperlipidemia: On Lipitor, last FLP, AST ALT satisfactory Hypogonadism: On testosterone, 1 mL every 2 weeks, last shot 10 days ago. DRE normal today. Check a PSA, CBC and testosterone level. RTC 6-8 months, CPX

## 2016-10-06 LAB — TESTOS,TOTAL,FREE AND SHBG (FEMALE)
SEX HORMONE BINDING GLOB.: 38 nmol/L (ref 22–77)
TESTOSTERONE,FREE: 58.9 pg/mL (ref 35.0–155.0)
TESTOSTERONE,TOTAL,LC/MS/MS: 385 ng/dL (ref 250–1100)

## 2016-10-10 ENCOUNTER — Telehealth: Payer: Self-pay

## 2016-10-10 ENCOUNTER — Other Ambulatory Visit: Payer: Self-pay | Admitting: Family Medicine

## 2016-10-10 ENCOUNTER — Other Ambulatory Visit: Payer: Self-pay | Admitting: Internal Medicine

## 2016-10-10 MED FILL — ATORVASTATIN 10 MG TABLET: 10 | 90 days supply | Qty: 90 | Fill #0

## 2016-10-10 MED FILL — lamoTRIgine 200 MG TABS: 200 | 90 days supply | Qty: 135 | Fill #0

## 2016-10-10 MED FILL — TESTOSTERONE CYP 200 MG/ML: 200 | 55 days supply | Qty: 4 | Fill #1

## 2016-10-10 MED FILL — METOPROLOL TARTRATE 50 MG T: 50 | 90 days supply | Qty: 180 | Fill #0

## 2016-10-10 NOTE — Telephone Encounter (Signed)
Advise patient, labs okay, refill 6 months supply

## 2016-10-10 NOTE — Telephone Encounter (Signed)
Pt is requesting refill on testosterone cypionate 200mg /mL injection.   Last OV: 10/03/2016 Last Fill: 06/19/2016 #3210mL and 0RF   Please advise.

## 2016-10-11 DIAGNOSIS — F411 Generalized anxiety disorder: Secondary | ICD-10-CM | POA: Diagnosis not present

## 2016-10-11 DIAGNOSIS — F39 Unspecified mood [affective] disorder: Secondary | ICD-10-CM | POA: Diagnosis not present

## 2016-10-11 MED ORDER — TESTOSTERONE CYPIONATE 200 MG/ML IM SOLN: 200.0000 mg | INTRAMUSCULAR | 0 refills | Status: DC

## 2016-10-11 NOTE — Telephone Encounter (Signed)
Rx faxed to [facility].  

## 2016-10-11 NOTE — Telephone Encounter (Signed)
Rx printed, awaiting MD signature.  

## 2016-11-07 DIAGNOSIS — F39 Unspecified mood [affective] disorder: Secondary | ICD-10-CM | POA: Diagnosis not present

## 2016-11-07 MED FILL — ADDERALL XR 20 MG CAP SA: 20 | 30 days supply | Qty: 60 | Fill #0

## 2016-12-07 DIAGNOSIS — F39 Unspecified mood [affective] disorder: Secondary | ICD-10-CM | POA: Diagnosis not present

## 2016-12-14 MED FILL — ADDERALL XR 20 MG CAP SA: 20 | 30 days supply | Qty: 60 | Fill #0

## 2016-12-14 MED FILL — lamoTRIgine 100 MG TABS: 100 | 30 days supply | Qty: 45 | Fill #0

## 2016-12-14 MED FILL — BUPROPION HCL XL 150 MG TAB: 150 | 30 days supply | Qty: 30 | Fill #0

## 2016-12-14 MED FILL — SERTRALINE HCL 100 MG TAB: 100 | 30 days supply | Qty: 30 | Fill #0

## 2016-12-29 DIAGNOSIS — G4733 Obstructive sleep apnea (adult) (pediatric): Secondary | ICD-10-CM | POA: Diagnosis not present

## 2017-01-01 ENCOUNTER — Telehealth: Payer: Self-pay | Admitting: *Deleted

## 2017-01-01 NOTE — Telephone Encounter (Signed)
[facility] Physician Orders [facility]; forwarded to provider/SLS 08/13

## 2017-01-02 NOTE — Telephone Encounter (Signed)
Forms faxed to [facility] at [phone]. Form sent for scanning.

## 2017-01-12 MED FILL — TESTOSTERONE CYP 200 MG/ML: 200 | 84 days supply | Qty: 6 | Fill #0

## 2017-01-22 MED FILL — SERTRALINE HCL 100 MG TAB: 100 | 30 days supply | Qty: 30 | Fill #1

## 2017-01-22 MED FILL — ATORVASTATIN 10 MG TABLET: 10 | 90 days supply | Qty: 90 | Fill #1

## 2017-01-23 ENCOUNTER — Other Ambulatory Visit: Payer: Self-pay | Admitting: Family Medicine

## 2017-01-23 MED FILL — buPROPion HCL ER (XL) 150 M: 150 | 30 days supply | Qty: 30 | Fill #1

## 2017-01-23 MED FILL — METOPROLOL TARTRATE 50 MG T: 50 | 90 days supply | Qty: 180 | Fill #0

## 2017-01-23 MED FILL — ADDERALL XR 20 MG CAP SA: 20 | 30 days supply | Qty: 60 | Fill #0

## 2017-02-01 DIAGNOSIS — F39 Unspecified mood [affective] disorder: Secondary | ICD-10-CM | POA: Diagnosis not present

## 2017-02-20 MED FILL — buPROPion HCL ER (XL) 150 M: 150 | 30 days supply | Qty: 30 | Fill #2

## 2017-02-20 MED FILL — ADDERALL XR 20 MG CAP SA: 20 | 30 days supply | Qty: 60 | Fill #0

## 2017-02-20 MED FILL — SERTRALINE HCL 100 MG TAB: 100 | 30 days supply | Qty: 30 | Fill #2

## 2017-03-15 MED FILL — lamoTRIgine 100 MG TABS: 100 | 30 days supply | Qty: 45 | Fill #1

## 2017-04-02 MED FILL — SERTRALINE HCL 100 MG TAB: 100 | 30 days supply | Qty: 30 | Fill #0

## 2017-04-02 MED FILL — BUPROPION HCL XL 150 MG TAB: 150 | 30 days supply | Qty: 30 | Fill #0

## 2017-04-09 MED FILL — ADDERALL XR 20 MG CAP SA: 20 | 30 days supply | Qty: 60 | Fill #0

## 2017-04-11 ENCOUNTER — Ambulatory Visit (INDEPENDENT_AMBULATORY_CARE_PROVIDER_SITE_OTHER): Payer: 59 | Admitting: Internal Medicine

## 2017-04-11 ENCOUNTER — Encounter: Payer: Self-pay | Admitting: Internal Medicine

## 2017-04-11 VITALS — BP 122/74 | HR 73 | Temp 98.0°F | Resp 14 | Ht 76.0 in | Wt 308.2 lb

## 2017-04-11 DIAGNOSIS — E291 Testicular hypofunction: Secondary | ICD-10-CM

## 2017-04-11 DIAGNOSIS — R739 Hyperglycemia, unspecified: Secondary | ICD-10-CM

## 2017-04-11 DIAGNOSIS — L853 Xerosis cutis: Secondary | ICD-10-CM

## 2017-04-11 DIAGNOSIS — Z0000 Encounter for general adult medical examination without abnormal findings: Secondary | ICD-10-CM

## 2017-04-11 LAB — COMPREHENSIVE METABOLIC PANEL
ALT: 39 U/L (ref 0–53)
AST: 50 U/L — AB (ref 0–37)
Albumin: 4.2 g/dL (ref 3.5–5.2)
Alkaline Phosphatase: 77 U/L (ref 39–117)
BUN: 18 mg/dL (ref 6–23)
CHLORIDE: 101 meq/L (ref 96–112)
CO2: 32 meq/L (ref 19–32)
CREATININE: 1.06 mg/dL (ref 0.40–1.50)
Calcium: 9.7 mg/dL (ref 8.4–10.5)
GFR: 76.21 mL/min (ref >60.00)
Glucose, Bld: 91 mg/dL (ref 70–99)
Potassium: 4.2 mEq/L (ref 3.5–5.1)
SODIUM: 138 meq/L (ref 135–145)
Total Bilirubin: 0.9 mg/dL (ref 0.2–1.2)
Total Protein: 7 g/dL (ref 6.0–8.3)

## 2017-04-11 LAB — LIPID PANEL
CHOL/HDL RATIO: 2
Cholesterol: 101 mg/dL (ref 0–200)
HDL: 44.1 mg/dL (ref >39.00)
LDL CALC: 46 mg/dL (ref 0–99)
NONHDL: 57.02
Triglycerides: 54 mg/dL (ref 0.0–149.0)
VLDL: 10.8 mg/dL (ref 0.0–40.0)

## 2017-04-11 LAB — HEMOGLOBIN A1C: HEMOGLOBIN A1C: 5.6 % (ref 4.6–6.5)

## 2017-04-11 NOTE — Progress Notes (Addendum)
Subjective:    Patient ID: James Boone, male    DOB: 10/22/1960, 56 y.o.   MRN: [id number]  DOS:  04/11/2017 Type of visit - description : CPX Interval history: Reports good compliance of medication.   Review of Systems Chronic dry skin feet,  fissures in the heels.  Other than above, a 14 point review of systems is negative     Past Medical History:  Diagnosis Date  . Abnormal liver function test   . Acquired cavovarus deformity of right foot 2016   Dr. Victorino DikeHewitt, using boot modification and [place] brace  . ADD (attention deficit disorder)   . ADD (attention deficit disorder)   . Allergic rhinitis   . Arthritis   . Back pain    lower  . Depression   . Hemorrhoid   . Hypercholesteremia   . Hyperglycemia   . Hyperlipidemia   . Hypertension   . Hypogonadism male   . Peripheral edema   . Sleep apnea 2005   dx in 2005 , on CPAP setting of 13  . Varicose veins    both legs    Past Surgical History:  Procedure Laterality Date  . HERNIA REPAIR  2001   abdominal hernia  . KNEE ARTHROSCOPY  05/2008   left, Dr.Geoffrey  . KNEE ARTHROSCOPY  2008   right, Dr.Geoffrey  . TOTAL ANKLE REPLACEMENT Right 2016  . TOTAL KNEE ARTHROPLASTY Left 12/30/2012   Procedure: LEFT TOTAL KNEE ARTHROPLASTY;  Surgeon: Loanne DrillingFrank V Aluisio, MD;  Location: [facility];  Service: Orthopedics;  Laterality: Left;  . TOTAL KNEE ARTHROPLASTY Right 03/16/2014   Procedure: RIGHT TOTAL KNEE ARTHROPLASTY;  Surgeon: Loanne DrillingFrank Aluisio V, MD;  Location: [facility];  Service: Orthopedics;  Laterality: Right;  Marland Kitchen. VASECTOMY  1990  . wisdom teeth extractiion     Family History  Problem Relation Age of Onset  . Depression Sister        no suicide   . Stroke Father        ag ~ 3764  . Coronary artery disease Father        smoker, MI and a CABG at 58 y/o  . Colon cancer Neg Hx   . Prostate cancer Neg Hx   . Diabetes Neg Hx      Social History   Socioeconomic History  . Marital status: Married    Spouse name: Not on  file  . Number of children: 2  . Years of education: Not on file  . Highest education level: Not on file  Social Needs  . Financial resource strain: Not on file  . Food insecurity - worry: Not on file  . Food insecurity - inability: Not on file  . Transportation needs - medical: Not on file  . Transportation needs - non-medical: Not on file  Occupational History  . Occupation: [employer], sales   Tobacco Use  . Smoking status: Never Smoker  . Smokeless tobacco: Never Used  Substance and Sexual Activity  . Alcohol use: Yes    Comment: socially   . Drug use: No  . Sexual activity: Yes  Other Topics Concern  . Not on file  Social History Narrative   Lives w/ wife, 1 child and mother in law          Allergies as of 04/11/2017   No Known Allergies     Medication List        Accurate as of 04/11/17 11:59 PM. Always use your most  recent med list.          amphetamine-dextroamphetamine 25 MG 24 hr capsule Commonly known as:  ADDERALL XR Take 50 mg by mouth every morning.   aspirin EC 81 MG tablet Take 81 mg by mouth daily.   atorvastatin 10 MG tablet Commonly known as:  LIPITOR Take 1 tablet (10 mg total) by mouth daily.   buPROPion 150 MG 24 hr tablet Commonly known as:  WELLBUTRIN XL Take 150 mg by mouth every morning.   lamoTRIgine 100 MG tablet Commonly known as:  LAMICTAL Take 100 mg by mouth every evening.   metoprolol tartrate 50 MG tablet Commonly known as:  LOPRESSOR TAKE 1 TABLET BY MOUTH TWICE DAILY   sertraline 100 MG tablet Commonly known as:  ZOLOFT Take 100 mg by mouth every morning.   testosterone cypionate 200 MG/ML injection Commonly known as:  DEPOTESTOSTERONE CYPIONATE Inject 1 mL (200 mg total) into the muscle every 14 (fourteen) days.          Objective:   Physical Exam BP 122/74 (BP Location: Left Arm, Patient Position: Sitting, Cuff Size: Normal)   Pulse 73   Temp 98 F (36.7 C) (Oral)   Resp 14   Ht 6\' 4"  (1.93  m)   Wt (!) 308 lb 4 oz (139.8 kg)   SpO2 96%   BMI 37.52 kg/m  General:   Well developed, morbidly obese appearing Neck: No  thyromegaly  HEENT:  Normocephalic . Face symmetric, atraumatic Lungs:  CTA B Normal respiratory effort, no intercostal retractions, no accessory muscle use. Heart: RRR,  no murmur.  No pretibial edema bilaterally  Abdomen:  Not distended, soft, non-tender. No rebound or rigidity.   Skin:  Feet skin extremely dry, thick in the heels with fissures.  Nails dystrophic. Neurologic:  alert & oriented X3.  Speech normal, gait appropriate for age and unassisted Strength symmetric and appropriate for age.  Psych: Cognition and judgment appear intact.  Cooperative with normal attention span and concentration.  Behavior appropriate. No anxious or depressed appearing.     Assessment & Plan:   Assessment  Assessment Hyperglycemia  HTN-- lopressor Hyperlipidemia-- lipitor  Elevated LFTs Edema DJD OSA -- on CPAP Hypogonadism -- f/u pcp Depression, ADD. Used to see Dr. Nolen MuMcKinney, will tranfer to [facility]  PLAN:  Hyperglycemia, HTN,hyperlipidemia: Diet control plus Lipitor, lopressor, checking labs Hypogonadism: on HRT, checking testosterone free and total Feet dry skin, dystrophic nails: to derm  Morbid obesity: counseled, encouraged to see a bariatric doctor  RTC 4 months

## 2017-04-11 NOTE — Patient Instructions (Signed)
GO TO THE LAB : Get the blood work     GO TO THE FRONT DESK Schedule your next appointment for a   Check up in 4 months , no fasting

## 2017-04-11 NOTE — Assessment & Plan Note (Addendum)
-  Td 2013; pnm 23 , flu shot: declined, benefits discussed   - (+) FH CAD on ASA -CCS: 08-2009 colonoscopy-- normal, next 10 years  -prostate cancer screening: On HRT, DRE , PSA wnl 09/2016  -labs: CMP FLP A1C Testost -Discussed healthy diet, exercise

## 2017-04-11 NOTE — Progress Notes (Signed)
Pre visit review using our clinic review tool, if applicable. No additional management support is needed unless otherwise documented below in the visit note. 

## 2017-04-12 NOTE — Assessment & Plan Note (Signed)
Hyperglycemia, HTN,hyperlipidemia: Diet control plus Lipitor, lopressor, checking labs Hypogonadism: on HRT, checking testosterone free and total Feet dry skin, dystrophic nails: to derm  Morbid obesity: counseled, encouraged to see a bariatric doctor  RTC 4 months

## 2017-04-16 LAB — TESTOS,TOTAL,FREE AND SHBG (FEMALE)
Free Testosterone: 119.5 pg/mL (ref 35.0–155.0)
Sex Hormone Binding: 32 nmol/L (ref 22–77)
Testosterone, Total, LC-MS-MS: 824 ng/dL (ref 250–1100)

## 2017-04-23 ENCOUNTER — Other Ambulatory Visit: Payer: Self-pay | Admitting: Internal Medicine

## 2017-04-23 MED FILL — TESTOSTERONE CYP 200 MG/ML: 200 | 84 days supply | Qty: 6 | Fill #0

## 2017-04-23 NOTE — Telephone Encounter (Signed)
Rx faxed to [facility].  

## 2017-04-23 NOTE — Telephone Encounter (Signed)
Pt is requesting refill on testosterone cypionate.   Last OV: 04/11/2017 Last Fill: 10/11/2016 #7810mL and 0RF   Please advise.

## 2017-04-23 NOTE — Telephone Encounter (Signed)
Okay to refill x6 months °

## 2017-04-23 NOTE — Telephone Encounter (Signed)
Rx printed, awaiting MD signature.  

## 2017-04-27 DIAGNOSIS — F39 Unspecified mood [affective] disorder: Secondary | ICD-10-CM | POA: Diagnosis not present

## 2017-05-01 ENCOUNTER — Other Ambulatory Visit: Payer: Self-pay

## 2017-05-01 ENCOUNTER — Ambulatory Visit (INDEPENDENT_AMBULATORY_CARE_PROVIDER_SITE_OTHER): Payer: 59 | Admitting: Family Medicine

## 2017-05-01 ENCOUNTER — Ambulatory Visit
Admission: RE | Admit: 2017-05-01 | Discharge: 2017-05-01 | Disposition: A | Discharge status: Home / Self Care | Payer: 59 | Source: Ambulatory Visit | Attending: Family Medicine | Admitting: Family Medicine

## 2017-05-01 VITALS — BP 136/80 | HR 64 | Temp 98.2°F | Resp 17 | Ht 76.0 in | Wt 309.2 lb

## 2017-05-01 DIAGNOSIS — S4992XA Unspecified injury of left shoulder and upper arm, initial encounter: Secondary | ICD-10-CM | POA: Diagnosis not present

## 2017-05-01 DIAGNOSIS — M25512 Pain in left shoulder: Secondary | ICD-10-CM | POA: Diagnosis not present

## 2017-05-01 DIAGNOSIS — W009XXA Unspecified fall due to ice and snow, initial encounter: Secondary | ICD-10-CM

## 2017-05-01 MED ORDER — HYDROCODONE-ACETAMINOPHEN 5-325 MG PO TABS: 1.0000 | ORAL_TABLET | Freq: Four times a day (QID) | ORAL | 0 refills | Status: DC | PRN

## 2017-05-01 MED FILL — HYDROCODON-APAP 5-325: 5-325 | 5 days supply | Qty: 20 | Fill #0

## 2017-05-01 NOTE — Patient Instructions (Addendum)
[facility] for [facility]. Wear sling for now, I will let you know about the results of the xray today. Hydrocodone if needed for pain. Depending on results of xray, can refer you to ortho if needed.   Return to the clinic or go to the nearest emergency room if any of your symptoms worsen or new symptoms occur.  [address], [place], [place] [address]  Phone: [phone]   Shoulder Pain Many things can cause shoulder pain, including:  An injury to the area.  Overuse of the shoulder.  Arthritis.  The source of the pain can be:  Inflammation.  An injury to the shoulder joint.  An injury to a tendon, ligament, or bone.  Follow these instructions at home: Take these actions to help with your pain:  Squeeze a soft ball or a foam pad as much as possible. This helps to keep the shoulder from swelling. It also helps to strengthen the arm.  Take over-the-counter and prescription medicines only as told by your health care provider.  If directed, apply ice to the area: ? Put ice in a plastic bag. ? Place a towel between your skin and the bag. ? Leave the ice on for 20 minutes, 2-3 times per day. Stop applying ice if it does not help with the pain.  If you were given a shoulder sling or immobilizer: ? Wear it as told. ? Remove it to shower or bathe. ? Move your arm as little as possible, but keep your hand moving to prevent swelling.  Contact a health care provider if:  Your pain gets worse.  Your pain is not relieved with medicines.  New pain develops in your arm, hand, or fingers. Get help right away if:  Your arm, hand, or fingers: ? Tingle. ? Become numb. ? Become swollen. ? Become painful. ? Turn white or blue. This information is not intended to replace advice given to you by your health care provider. Make sure you discuss any questions you have with your health care provider. Document Released: 02/15/2005 Document Revised: 01/02/2016  Document Reviewed: 08/31/2014 Elsevier Interactive Patient Education  2017 [employer]    IF you received an x-ray today, you will receive an invoice from [facility]. Please contact [facility] at [phone] with questions or concerns regarding your invoice.   IF you received labwork today, you will receive an invoice from [facility]. Please contact [facility] at [phone] with questions or concerns regarding your invoice.   Our billing staff will not be able to assist you with questions regarding bills from these companies.  You will be contacted with the lab results as soon as they are available. The fastest way to get your results is to activate your My Chart account. Instructions are located on the last page of this paperwork. If you have not heard from us regarding the results in 2 weeks, please contact this office.

## 2017-05-01 NOTE — Progress Notes (Signed)
Subjective:  By signing my name below, I, Essence Howell, attest that this documentation has been prepared under the direction and in the presence of Shade Flood, MD. Electronically Signed: Charline Bills, ED Scribe 05/01/2017 at 1:32 PM.   Patient ID: James Boone, male    DOB: 1961/03/11, 56 y.o.   MRN: [id number]  Chief Complaint  Patient presents with  . Fall    ? dislocation of left shoulder, pain level 5/10, took naproxen sodium approx. 11 am helped a little with pain   HPI James Boone is a 56 y.o. male who presents to Primary Care at [facility] complaining of a fall that occurred around 8 AM this morning. Pt states that he slipped on ice and landed on his L elbow. Pt reports sudden onset of 5/10, non-radiating L shoulder pain that is exacerbated with movement. Pt did not apply ice or heat PTA but took 2 Naproxen tablets. Denies neck pain, back pain, wrist pain, elbow pain, h/o previous L shoulder injuries or surgeries. Pt is R hand dominant. No known medication allergies. Has been seen at [facility] in the past for knee surgeries.  Patient Active Problem List   Diagnosis Date Noted  . PCP NOTES >>>>>>>>>>>>>>>>>>>>>>>> 10/04/2016  . Edema-- R>>L 08/29/2013  . Hyponatremia 12/31/2012  . DJD (degenerative joint disease) 10/18/2011  . Annual physical exam 04/17/2011  . Hyperglycemia   . Hyperlipidemia 08/28/2007  . Hypogonadism in male 01/02/2007  . LIVER FUNCTION TESTS, ABNORMAL 01/02/2007  . Depression  10/31/2006  . Attention deficit disorder--Dr. Nolen Mu 10/31/2006  . Essential hypertension 10/31/2006  . ALLERGIC RHINITIS 10/31/2006  . SLEEP APNEA 10/31/2006   Past Medical History:  Diagnosis Date  . Abnormal liver function test   . Acquired cavovarus deformity of right foot 2016   Dr. Victorino Dike, using boot modification and [place] brace  . ADD (attention deficit disorder)   . ADD (attention deficit disorder)   . Allergic rhinitis   . Arthritis   . Back pain    lower  . Depression   . Hemorrhoid   . Hypercholesteremia   . Hyperglycemia   . Hyperlipidemia   . Hypertension   . Hypogonadism male   . Peripheral edema   . Sleep apnea 2005   dx in 2005 , on CPAP setting of 13  . Varicose veins    both legs   Past Surgical History:  Procedure Laterality Date  . HERNIA REPAIR  2001   abdominal hernia  . KNEE ARTHROSCOPY  05/2008   left, Dr.Geoffrey  . KNEE ARTHROSCOPY  2008   right, Dr.Geoffrey  . TOTAL ANKLE REPLACEMENT Right 2016  . TOTAL KNEE ARTHROPLASTY Left 12/30/2012   Procedure: LEFT TOTAL KNEE ARTHROPLASTY;  Surgeon: Loanne Drilling, MD;  Location: [facility];  Service: Orthopedics;  Laterality: Left;  . TOTAL KNEE ARTHROPLASTY Right 03/16/2014   Procedure: RIGHT TOTAL KNEE ARTHROPLASTY;  Surgeon: Loanne Drilling, MD;  Location: [facility];  Service: Orthopedics;  Laterality: Right;  Marland Kitchen VASECTOMY  1990  . wisdom teeth extractiion     No Known Allergies Prior to Admission medications   Medication Sig Start Date End Date Taking? Authorizing Provider  amphetamine-dextroamphetamine (ADDERALL XR) 25 MG 24 hr capsule Take 50 mg by mouth every morning.     [provider]  aspirin EC 81 MG tablet Take 81 mg by mouth daily.    [provider]  atorvastatin (LIPITOR) 10 MG tablet Take 1 tablet (10 mg total) by  mouth daily. 10/10/16   Wanda Plump, MD  buPROPion (WELLBUTRIN XL) 150 MG 24 hr tablet Take 150 mg by mouth every morning.     [provider]  lamoTRIgine (LAMICTAL) 100 MG tablet Take 100 mg by mouth every evening.     [provider]  metoprolol tartrate (LOPRESSOR) 50 MG tablet TAKE 1 TABLET BY MOUTH TWICE DAILY 01/23/17   Bradd Canary, MD  sertraline (ZOLOFT) 100 MG tablet Take 100 mg by mouth every morning.     [provider]  testosterone cypionate (DEPOTESTOSTERONE CYPIONATE) 200 MG/ML injection Inject 1 mL (200 mg total) into the muscle every 14 (fourteen) days. 04/23/17   Wanda Plump, MD    Social History   Socioeconomic History  . Marital status: Married    Spouse name: Not on file  . Number of children: 2  . Years of education: Not on file  . Highest education level: Not on file  Social Needs  . Financial resource strain: Not on file  . Food insecurity - worry: Not on file  . Food insecurity - inability: Not on file  . Transportation needs - medical: Not on file  . Transportation needs - non-medical: Not on file  Occupational History  . Occupation: [employer], sales   Tobacco Use  . Smoking status: Never Smoker  . Smokeless tobacco: Never Used  Substance and Sexual Activity  . Alcohol use: Yes    Comment: socially   . Drug use: No  . Sexual activity: Yes  Other Topics Concern  . Not on file  Social History Narrative   Lives w/ wife, 1 child and mother in law       Review of Systems  Musculoskeletal: Positive for arthralgias. Negative for back pain and neck pain.      Objective:   Physical Exam  Constitutional: He is oriented to person, place, and time. He appears well-developed and well-nourished. No distress.  HENT:  Head: Normocephalic and atraumatic.  Eyes: Conjunctivae and EOM are normal.  Neck: Neck supple. No tracheal deviation present.  C-spine: nontender. Rain free ROM  Cardiovascular: Normal rate.  Pulmonary/Chest: Effort normal. No respiratory distress.  Musculoskeletal: Normal range of motion.  [place] joint, AC joint and clavicle are nontender.  L shoulder: do not see any obvious sulcus. Slight discomfort over posterior shoulder. Otherwise no focal bony tenderness. Able to ABduct to 90 degrees. Flexion limited to 45 degrees. Internal rotation to ~L4. L elbow: superficial abrasions on extensor surface. Pain free ROM. No bony tenderness including radius. L wrist: no bony tenderness. Pain free ROM.   Neurological: He is alert and oriented to person, place, and time.  Skin: Skin is warm and dry.  Psychiatric: He has a normal mood and  affect. His behavior is normal.  Nursing note and vitals reviewed.  Vitals:   05/01/17 1311  BP: 136/80  Pulse: 64  Resp: 17  Temp: 98.2 F (36.8 C)  TempSrc: Oral  SpO2: 95%  Weight: (!) 309 lb 3.2 oz (140.3 kg)  Height: 6\' 4"  (1.93 m)  Dg Shoulder Left  Result Date: 05/01/2017 CLINICAL DATA:  Left shoulder pain since the patient suffered a slip and fall on ice this morning. Initial encounter. EXAM: LEFT SHOULDER - 2+ VIEW COMPARISON:  None. FINDINGS: There is no evidence of fracture or dislocation. There is no evidence of arthropathy or other focal bone abnormality. Soft tissues are unremarkable. IMPRESSION: Negative exam. Electronically Signed   By: Maisie Fus  Dalessio M.D.   On: 05/01/2017 14:33       Assessment & Plan:    James Landmarkan C Heather is a 56 y.o. male Acute pain of left shoulder - Plan: HYDROcodone-acetaminophen (NORCO/VICODIN) 5-325 MG tablet, Sling immobilizer, DG Shoulder Left, CANCELED: DG Shoulder Left  Fall due to slipping on ice or snow, initial encounter - Plan: HYDROcodone-acetaminophen (NORCO/VICODIN) 5-325 MG tablet, DG Shoulder Left, CANCELED: DG Shoulder Left  Acute pain of left shoulder after following ice. X-ray reassuring, was initially placed in shoulder sling. Differential includes rotator cuff tear. Discussed x-ray results on 05/02/2017, has been doing okay with episodic use of hydrocodone as above.  -Continue sling as needed for comfort, slow/gentle range of motion activities discussed with initial pendulum exercises then wall crawls as tolerated.  -recheck in the next 1 week to 10 days to determine next step. Sooner if worse  Meds ordered this encounter  Medications  . HYDROcodone-acetaminophen (NORCO/VICODIN) 5-325 MG tablet    Sig: Take 1 tablet by mouth every 6 (six) hours as needed for moderate pain.    Dispense:  20 tablet    Refill:  0   Patient Instructions   [facility] for [facility]. Wear sling for now, I will let you  know about the results of the xray today. Hydrocodone if needed for pain. Depending on results of xray, can refer you to ortho if needed.   Return to the clinic or go to the nearest emergency room if any of your symptoms worsen or new symptoms occur.  [address], [place], [place] [address]  Phone: [phone]   Shoulder Pain Many things can cause shoulder pain, including:  An injury to the area.  Overuse of the shoulder.  Arthritis.  The source of the pain can be:  Inflammation.  An injury to the shoulder joint.  An injury to a tendon, ligament, or bone.  Follow these instructions at home: Take these actions to help with your pain:  Squeeze a soft ball or a foam pad as much as possible. This helps to keep the shoulder from swelling. It also helps to strengthen the arm.  Take over-the-counter and prescription medicines only as told by your health care provider.  If directed, apply ice to the area: ? Put ice in a plastic bag. ? Place a towel between your skin and the bag. ? Leave the ice on for 20 minutes, 2-3 times per day. Stop applying ice if it does not help with the pain.  If you were given a shoulder sling or immobilizer: ? Wear it as told. ? Remove it to shower or bathe. ? Move your arm as little as possible, but keep your hand moving to prevent swelling.  Contact a health care provider if:  Your pain gets worse.  Your pain is not relieved with medicines.  New pain develops in your arm, hand, or fingers. Get help right away if:  Your arm, hand, or fingers: ? Tingle. ? Become numb. ? Become swollen. ? Become painful. ? Turn white or blue. This information is not intended to replace advice given to you by your health care provider. Make sure you discuss any questions you have with your health care provider. Document Released: 02/15/2005 Document Revised: 01/02/2016 Document Reviewed: 08/31/2014 Elsevier Interactive Patient Education  2017 [employer]    IF you received an x-ray today, you will receive an invoice from [facility]. Please contact [facility] at [phone] with questions or concerns  regarding your invoice.   IF you received labwork today, you will receive an invoice from [facility]. Please contact [facility] at [phone] with questions or concerns regarding your invoice.   Our billing staff will not be able to assist you with questions regarding bills from these companies.  You will be contacted with the lab results as soon as they are available. The fastest way to get your results is to activate your My Chart account. Instructions are located on the last page of this paperwork. If you have not heard from us regarding the results in 2 weeks, please contact this office.      I personally performed the services described in this documentation, which was scribed in my presence. The recorded information has been reviewed and considered for accuracy and completeness, addended by me as needed, and agree with information above.  Signed,   Meredith StaggersJeffrey Ilanna Deihl, MD Primary Care at [facility].  05/02/17 12:20 PM

## 2017-05-02 ENCOUNTER — Telehealth: Payer: Self-pay | Admitting: Family Medicine

## 2017-05-02 ENCOUNTER — Encounter: Payer: Self-pay | Admitting: Family Medicine

## 2017-05-02 NOTE — Telephone Encounter (Signed)
Pt called to get results of shoulder xray; notes from Dr Chilton SiGreen are as follows, "Proceed to [facility]. Wear sling for now, I will let you know about the results of the xray today. Hydrocodone if needed for pain. Depending on results of xray, can refer you to ortho if needed. Return to the clinic or go to the nearest emergency room if any of your symptoms worsen or new symptoms occur." Pt verbalizes understanding.

## 2017-05-03 MED FILL — lamoTRIgine 100 MG TABS: 100 | 30 days supply | Qty: 45 | Fill #2

## 2017-05-07 ENCOUNTER — Other Ambulatory Visit: Payer: Self-pay | Admitting: Family Medicine

## 2017-05-07 MED FILL — ATORVASTATIN 10 MG TABLET: 10 | 90 days supply | Qty: 90 | Fill #2

## 2017-05-07 MED FILL — METOPROLOL TARTRATE 50 MG T: 50 | 90 days supply | Qty: 180 | Fill #0

## 2017-05-08 ENCOUNTER — Encounter: Payer: Self-pay | Admitting: Internal Medicine

## 2017-05-08 MED FILL — buPROPion HCL ER (XL) 150 M: 150 | 30 days supply | Qty: 30 | Fill #0

## 2017-05-08 MED FILL — SERTRALINE HCL 100 MG TAB: 100 | 30 days supply | Qty: 30 | Fill #0

## 2017-05-08 MED FILL — ADDERALL XR 20 MG CAP SA: 20 | 30 days supply | Qty: 60 | Fill #0

## 2017-05-16 ENCOUNTER — Encounter: Payer: Self-pay | Admitting: Internal Medicine

## 2017-05-16 ENCOUNTER — Ambulatory Visit (INDEPENDENT_AMBULATORY_CARE_PROVIDER_SITE_OTHER): Payer: 59 | Admitting: Internal Medicine

## 2017-05-16 VITALS — BP 128/81 | HR 63 | Temp 98.3°F | Resp 14 | Ht 76.0 in | Wt 309.1 lb

## 2017-05-16 DIAGNOSIS — S4992XD Unspecified injury of left shoulder and upper arm, subsequent encounter: Secondary | ICD-10-CM

## 2017-05-16 NOTE — Assessment & Plan Note (Signed)
Left shoulder injury: Either a contusion versus internal derangement.  I asked the patient to keep that shoulder moving to prevent freezing, judicious use of naproxen and ok to use a hydrocodone  Leftover from time to time. Refer to sports medicine.

## 2017-05-16 NOTE — Patient Instructions (Signed)
Keep your shoulder moving! Naproxen OTC 1 tablet twice a day with food only, do not take that for prolonged periods of time We will refer you to a sports medicine doctor    Shoulder Exercises Ask your health care provider which exercises are safe for you. Do exercises exactly as told by your health care provider and adjust them as directed. It is normal to feel mild stretching, pulling, tightness, or discomfort as you do these exercises, but you should stop right away if you feel sudden pain or your pain gets worse.Do not begin these exercises until told by your health care provider. RANGE OF MOTION EXERCISES These exercises warm up your muscles and joints and improve the movement and flexibility of your shoulder. These exercises also help to relieve pain, numbness, and tingling. These exercises involve stretching your injured shoulder directly. Exercise A: Pendulum  1. Stand near a wall or a surface that you can hold onto for balance. 2. Bend at the waist and let your left / right arm hang straight down. Use your other arm to support you. Keep your back straight and do not lock your knees. 3. Relax your left / right arm and shoulder muscles, and move your hips and your trunk so your left / right arm swings freely. Your arm should swing because of the motion of your body, not because you are using your arm or shoulder muscles. 4. Keep moving your body so your arm swings in the following directions, as told by your health care provider: ? Side to side. ? Forward and backward. ? In clockwise and counterclockwise circles. 5. Continue each motion for __________ seconds, or for as long as told by your health care provider. 6. Slowly return to the starting position. Repeat __________ times. Complete this exercise __________ times a day. Exercise B:Flexion, Standing  1. Stand and hold a broomstick, a cane, or a similar object. Place your hands a little more than shoulder-width apart on the object.  Your left / right hand should be palm-up, and your other hand should be palm-down. 2. Keep your elbow straight and keep your shoulder muscles relaxed. Push the stick down with your healthy arm to raise your left / right arm in front of your body, and then over your head until you feel a stretch in your shoulder. ? Avoid shrugging your shoulder while you raise your arm. Keep your shoulder blade tucked down toward the middle of your back. 3. Hold for __________ seconds. 4. Slowly return to the starting position. Repeat __________ times. Complete this exercise __________ times a day. Exercise C: Abduction, Standing 1. Stand and hold a broomstick, a cane, or a similar object. Place your hands a little more than shoulder-width apart on the object. Your left / right hand should be palm-up, and your other hand should be palm-down. 2. While keeping your elbow straight and your shoulder muscles relaxed, push the stick across your body toward your left / right side. Raise your left / right arm to the side of your body and then over your head until you feel a stretch in your shoulder. ? Do not raise your arm above shoulder height, unless your health care provider tells you to do that. ? Avoid shrugging your shoulder while you raise your arm. Keep your shoulder blade tucked down toward the middle of your back. 3. Hold for __________ seconds. 4. Slowly return to the starting position. Repeat __________ times. Complete this exercise __________ times a day. Exercise D:Internal Rotation  1. Place your left / right hand behind your back, palm-up. 2. Use your other hand to dangle an exercise band, a towel, or a similar object over your shoulder. Grasp the band with your left / right hand so you are holding onto both ends. 3. Gently pull up on the band until you feel a stretch in the front of your left / right shoulder. ? Avoid shrugging your shoulder while you raise your arm. Keep your shoulder blade tucked down  toward the middle of your back. 4. Hold for __________ seconds. 5. Release the stretch by letting go of the band and lowering your hands. Repeat __________ times. Complete this exercise __________ times a day. STRETCHING EXERCISES These exercises warm up your muscles and joints and improve the movement and flexibility of your shoulder. These exercises also help to relieve pain, numbness, and tingling. These exercises are done using your healthy shoulder to help stretch the muscles of your injured shoulder. Exercise E: Research officer, political partyCorner Stretch (External Rotation and Abduction)  1. Stand in a doorway with one of your feet slightly in front of the other. This is called a staggered stance. If you cannot reach your forearms to the door frame, stand facing a corner of a room. 2. Choose one of the following positions as told by your health care provider: ? Place your hands and forearms on the door frame above your head. ? Place your hands and forearms on the door frame at the height of your head. ? Place your hands on the door frame at the height of your elbows. 3. Slowly move your weight onto your front foot until you feel a stretch across your chest and in the front of your shoulders. Keep your head and chest upright and keep your abdominal muscles tight. 4. Hold for __________ seconds. 5. To release the stretch, shift your weight to your back foot. Repeat __________ times. Complete this stretch __________ times a day. Exercise F:Extension, Standing 1. Stand and hold a broomstick, a cane, or a similar object behind your back. ? Your hands should be a little wider than shoulder-width apart. ? Your palms should face away from your back. 2. Keeping your elbows straight and keeping your shoulder muscles relaxed, move the stick away from your body until you feel a stretch in your shoulder. ? Avoid shrugging your shoulders while you move the stick. Keep your shoulder blade tucked down toward the middle of your  back. 3. Hold for __________ seconds. 4. Slowly return to the starting position. Repeat __________ times. Complete this exercise __________ times a day. STRENGTHENING EXERCISES These exercises build strength and endurance in your shoulder. Endurance is the ability to use your muscles for a long time, even after they get tired. Exercise G:External Rotation  1. Sit in a stable chair without armrests. 2. Secure an exercise band at elbow height on your left / right side. 3. Place a soft object, such as a folded towel or a small pillow, between your left / right upper arm and your body to move your elbow a few inches away (about 10 cm) from your side. 4. Hold the end of the band so it is tight and there is no slack. 5. Keeping your elbow pressed against the soft object, move your left / right forearm out, away from your abdomen. Keep your body steady so only your forearm moves. 6. Hold for __________ seconds. 7. Slowly return to the starting position. Repeat __________ times. Complete this exercise __________ times a  day. Exercise H:Shoulder Abduction  1. Sit in a stable chair without armrests, or stand. 2. Hold a __________ weight in your left / right hand, or hold an exercise band with both hands. 3. Start with your arms straight down and your left / right palm facing in, toward your body. 4. Slowly lift your left / right hand out to your side. Do not lift your hand above shoulder height unless your health care provider tells you that this is safe. ? Keep your arms straight. ? Avoid shrugging your shoulder while you do this movement. Keep your shoulder blade tucked down toward the middle of your back. 5. Hold for __________ seconds. 6. Slowly lower your arm, and return to the starting position. Repeat __________ times. Complete this exercise __________ times a day. Exercise I:Shoulder Extension 1. Sit in a stable chair without armrests, or stand. 2. Secure an exercise band to a stable  object in front of you where it is at shoulder height. 3. Hold one end of the exercise band in each hand. Your palms should face each other. 4. Straighten your elbows and lift your hands up to shoulder height. 5. Step back, away from the secured end of the exercise band, until the band is tight and there is no slack. 6. Squeeze your shoulder blades together as you pull your hands down to the sides of your thighs. Stop when your hands are straight down by your sides. Do not let your hands go behind your body. 7. Hold for __________ seconds. 8. Slowly return to the starting position. Repeat __________ times. Complete this exercise __________ times a day. Exercise J:Standing Shoulder Row 1. Sit in a stable chair without armrests, or stand. 2. Secure an exercise band to a stable object in front of you so it is at waist height. 3. Hold one end of the exercise band in each hand. Your palms should be in a thumbs-up position. 4. Bend each of your elbows to an "L" shape (about 90 degrees) and keep your upper arms at your sides. 5. Step back until the band is tight and there is no slack. 6. Slowly pull your elbows back behind you. 7. Hold for __________ seconds. 8. Slowly return to the starting position. Repeat __________ times. Complete this exercise __________ times a day. Exercise K:Shoulder Press-Ups  1. Sit in a stable chair that has armrests. Sit upright, with your feet flat on the floor. 2. Put your hands on the armrests so your elbows are bent and your fingers are pointing forward. Your hands should be about even with the sides of your body. 3. Push down on the armrests and use your arms to lift yourself off of the chair. Straighten your elbows and lift yourself up as much as you comfortably can. ? Move your shoulder blades down, and avoid letting your shoulders move up toward your ears. ? Keep your feet on the ground. As you get stronger, your feet should support less of your body weight as  you lift yourself up. 4. Hold for __________ seconds. 5. Slowly lower yourself back into the chair. Repeat __________ times. Complete this exercise __________ times a day. Exercise L: Wall Push-Ups  1. Stand so you are facing a stable wall. Your feet should be about one arm-length away from the wall. 2. Lean forward and place your palms on the wall at shoulder height. 3. Keep your feet flat on the floor as you bend your elbows and lean forward toward the wall. 4.  Hold for __________ seconds. 5. Straighten your elbows to push yourself back to the starting position. Repeat __________ times. Complete this exercise __________ times a day. This information is not intended to replace advice given to you by your health care provider. Make sure you discuss any questions you have with your health care provider. Document Released: 03/22/2005 Document Revised: 01/31/2016 Document Reviewed: 01/17/2015 Elsevier Interactive Patient Education  2018 [employer].

## 2017-05-16 NOTE — Progress Notes (Signed)
Subjective:    Patient ID: James Boone, male    DOB: 04/04/1961, 56 y.o.   MRN: [id number]  DOS:  05/16/2017 Type of visit - description : acute Interval history:  Was seen at urgent care 05/01/2017 after he slipped on ice and fell, landed on the L. C/o pain at L elbow & shoulder at that time Shoulder XR (-) Rx sling, hydrocodone Since then, the pain is less severe but he still has some difficulty moving the shoulder.  Review of Systems Denies neck pain or paresthesias Left elbow pain resolved  Past Medical History:  Diagnosis Date  . Abnormal liver function test   . Acquired cavovarus deformity of right foot 2016   Dr. Victorino DikeHewitt, using boot modification and [place] brace  . ADD (attention deficit disorder)   . ADD (attention deficit disorder)   . Allergic rhinitis   . Arthritis   . Back pain    lower  . Depression   . Hemorrhoid   . Hypercholesteremia   . Hyperglycemia   . Hyperlipidemia   . Hypertension   . Hypogonadism male   . Peripheral edema   . Sleep apnea 2005   dx in 2005 , on CPAP setting of 13  . Varicose veins    both legs    Past Surgical History:  Procedure Laterality Date  . HERNIA REPAIR  2001   abdominal hernia  . KNEE ARTHROSCOPY  05/2008   left, Dr.Geoffrey  . KNEE ARTHROSCOPY  2008   right, Dr.Geoffrey  . TOTAL ANKLE REPLACEMENT Right 2016  . TOTAL KNEE ARTHROPLASTY Left 12/30/2012   Procedure: LEFT TOTAL KNEE ARTHROPLASTY;  Surgeon: Loanne DrillingFrank V Aluisio, MD;  Location: [facility];  Service: Orthopedics;  Laterality: Left;  . TOTAL KNEE ARTHROPLASTY Right 03/16/2014   Procedure: RIGHT TOTAL KNEE ARTHROPLASTY;  Surgeon: Loanne DrillingFrank Aluisio V, MD;  Location: [facility];  Service: Orthopedics;  Laterality: Right;  Marland Kitchen. VASECTOMY  1990  . wisdom teeth extractiion      Social History   Socioeconomic History  . Marital status: Married    Spouse name: Not on file  . Number of children: 2  . Years of education: Not on file  . Highest education level: Not on file    Social Needs  . Financial resource strain: Not on file  . Food insecurity - worry: Not on file  . Food insecurity - inability: Not on file  . Transportation needs - medical: Not on file  . Transportation needs - non-medical: Not on file  Occupational History  . Occupation: [employer], sales   Tobacco Use  . Smoking status: Never Smoker  . Smokeless tobacco: Never Used  Substance and Sexual Activity  . Alcohol use: Yes    Comment: socially   . Drug use: No  . Sexual activity: Yes  Other Topics Concern  . Not on file  Social History Narrative   Lives w/ wife, 1 child and mother in law          Allergies as of 05/16/2017   No Known Allergies     Medication List        Accurate as of 05/16/17  5:20 PM. Always use your most recent med list.          amphetamine-dextroamphetamine 25 MG 24 hr capsule Commonly known as:  ADDERALL XR Take 50 mg by mouth every morning.   aspirin EC 81 MG tablet Take 81 mg by mouth daily.   atorvastatin 10 MG  tablet Commonly known as:  LIPITOR Take 1 tablet (10 mg total) by mouth daily.   buPROPion 150 MG 24 hr tablet Commonly known as:  WELLBUTRIN XL Take 150 mg by mouth every morning.   HYDROcodone-acetaminophen 5-325 MG tablet Commonly known as:  NORCO/VICODIN Take 1 tablet by mouth every 6 (six) hours as needed for moderate pain.   lamoTRIgine 100 MG tablet Commonly known as:  LAMICTAL Take 100 mg by mouth every evening.   metoprolol tartrate 50 MG tablet Commonly known as:  LOPRESSOR TAKE 1 TABLET BY MOUTH TWICE DAILY   sertraline 100 MG tablet Commonly known as:  ZOLOFT Take 100 mg by mouth every morning.   testosterone cypionate 200 MG/ML injection Commonly known as:  DEPOTESTOSTERONE CYPIONATE Inject 1 mL (200 mg total) into the muscle every 14 (fourteen) days.          Objective:   Physical Exam BP 128/81 (BP Location: Right Arm, Patient Position: Sitting, Cuff Size: Normal)   Pulse 63   Temp 98.3  F (36.8 C) (Oral)   Resp 14   Ht 6\' 4"  (1.93 m)   Wt (!) 309 lb 2 oz (140.2 kg)   SpO2 97%   BMI 37.63 kg/m  General:   Well developed, well nourished . NAD.  HEENT:  Normocephalic . Face symmetric, atraumatic MSK: Neck: Full range of motion. Right shoulder normal Left shoulder: Range of motion decreased  with active > passive motion and arm use overhead Skin: Not pale. Not jaundice Neurologic:  alert & oriented X3.  Speech normal, gait appropriate for age and unassisted Motor symmetric Psych--  Cognition and judgment appear intact.  Cooperative with normal attention span and concentration.  Behavior appropriate. No anxious or depressed appearing.      Assessment & Plan:    Assessment Hyperglycemia  HTN-- lopressor Hyperlipidemia-- lipitor  Elevated LFTs Edema DJD OSA -- on CPAP Hypogonadism -- f/u pcp Depression, ADD. Used to see Dr. Nolen MuMcKinney, will tranfer to [facility]  PLAN:  Left shoulder injury: Either a contusion versus internal derangement.  I asked the patient to keep that shoulder moving to prevent freezing, judicious use of naproxen and ok to use a hydrocodone  Leftover from time to time. Refer to sports medicine.

## 2017-05-16 NOTE — Progress Notes (Signed)
Pre visit review using our clinic review tool, if applicable. No additional management support is needed unless otherwise documented below in the visit note. 

## 2017-05-29 DIAGNOSIS — I872 Venous insufficiency (chronic) (peripheral): Secondary | ICD-10-CM | POA: Diagnosis not present

## 2017-05-29 DIAGNOSIS — Z23 Encounter for immunization: Secondary | ICD-10-CM | POA: Diagnosis not present

## 2017-05-29 DIAGNOSIS — L603 Nail dystrophy: Secondary | ICD-10-CM | POA: Diagnosis not present

## 2017-05-29 MED FILL — TRIAMCINOLONE ACETONIDE 0.1: 0.1 | 30 days supply | Qty: 454 | Fill #0

## 2017-05-31 ENCOUNTER — Ambulatory Visit: Payer: 59 | Admitting: Family Medicine

## 2017-05-31 ENCOUNTER — Encounter: Payer: Self-pay | Admitting: Family Medicine

## 2017-05-31 DIAGNOSIS — M25512 Pain in left shoulder: Secondary | ICD-10-CM | POA: Diagnosis not present

## 2017-05-31 DIAGNOSIS — S4992XA Unspecified injury of left shoulder and upper arm, initial encounter: Secondary | ICD-10-CM

## 2017-05-31 NOTE — Patient Instructions (Signed)
I'm concerned you tore your rotator cuff (ultrasound indicates retracting high grade tear of the supraspinatus as most concerning finding correlating with your lack of motion). We will go ahead with an MRI to further assess. Take ibuprofen as needed in meantime. Avoid overhead motions, lifting as much as possible with this arm. Next steps, follow-up will depend on the MRI.

## 2017-06-01 ENCOUNTER — Encounter: Payer: Self-pay | Admitting: Family Medicine

## 2017-06-01 DIAGNOSIS — S4992XA Unspecified injury of left shoulder and upper arm, initial encounter: Secondary | ICD-10-CM | POA: Insufficient documentation

## 2017-06-01 NOTE — Progress Notes (Signed)
PCP and consultation requested by: James Boone, Jose E, MD  Subjective:   HPI: Patient is a 57 y.o. male here for left shoulder injury.  Patient reports on 12/11 he slipped on the ice and landed directly onto his left forearm. Immediate pain, difficulty using left arm. Has continued to have problems trying to reach and with general motion. Pain level 0/10 at rest but up to 5/10 and sharp at worst. + night pain. Took norco initially, now taking ibuprofen. No prior injuries to left shoulder. Is right handed. No skin changes, numbness.  Past Medical History:  Diagnosis Date  . Abnormal liver function test   . Acquired cavovarus deformity of right foot 2016   Dr. Victorino DikeHewitt, using boot modification and [place] brace  . ADD (attention deficit disorder)   . ADD (attention deficit disorder)   . Allergic rhinitis   . Arthritis   . Back pain    lower  . Depression   . Hemorrhoid   . Hypercholesteremia   . Hyperglycemia   . Hyperlipidemia   . Hypertension   . Hypogonadism male   . Peripheral edema   . Sleep apnea 2005   dx in 2005 , on CPAP setting of 13  . Varicose veins    both legs    Current Outpatient Medications on File Prior to Visit  Medication Sig Dispense Refill  . amphetamine-dextroamphetamine (ADDERALL XR) 25 MG 24 hr capsule Take 50 mg by mouth every morning.     Marland Kitchen. aspirin EC 81 MG tablet Take 81 mg by mouth daily.    Marland Kitchen. atorvastatin (LIPITOR) 10 MG tablet Take 1 tablet (10 mg total) by mouth daily. 90 tablet 2  . buPROPion (WELLBUTRIN XL) 150 MG 24 hr tablet Take 150 mg by mouth every morning.     Marland Kitchen. HYDROcodone-acetaminophen (NORCO/VICODIN) 5-325 MG tablet Take 1 tablet by mouth every 6 (six) hours as needed for moderate pain. 20 tablet 0  . lamoTRIgine (LAMICTAL) 100 MG tablet Take 100 mg by mouth every evening.     . metoprolol tartrate (LOPRESSOR) 50 MG tablet TAKE 1 TABLET BY MOUTH TWICE DAILY 180 tablet 0  . sertraline (ZOLOFT) 100 MG tablet Take 100 mg by mouth every  morning.     . testosterone cypionate (DEPOTESTOSTERONE CYPIONATE) 200 MG/ML injection Inject 1 mL (200 mg total) into the muscle every 14 (fourteen) days. 10 mL 5  . triamcinolone cream (KENALOG) 0.1 %   0   No current facility-administered medications on file prior to visit.     Past Surgical History:  Procedure Laterality Date  . HERNIA REPAIR  2001   abdominal hernia  . KNEE ARTHROSCOPY  05/2008   left, Dr.Geoffrey  . KNEE ARTHROSCOPY  2008   right, Dr.Geoffrey  . TOTAL ANKLE REPLACEMENT Right 2016  . TOTAL KNEE ARTHROPLASTY Left 12/30/2012   Procedure: LEFT TOTAL KNEE ARTHROPLASTY;  Surgeon: Loanne DrillingFrank V Aluisio, MD;  Location: [facility];  Service: Orthopedics;  Laterality: Left;  . TOTAL KNEE ARTHROPLASTY Right 03/16/2014   Procedure: RIGHT TOTAL KNEE ARTHROPLASTY;  Surgeon: Loanne DrillingFrank Aluisio V, MD;  Location: [facility];  Service: Orthopedics;  Laterality: Right;  Marland Kitchen. VASECTOMY  1990  . wisdom teeth extractiion      No Known Allergies  Social History   Socioeconomic History  . Marital status: Married    Spouse name: Not on file  . Number of children: 2  . Years of education: Not on file  . Highest education level: Not on file  Social Needs  . Financial resource strain: Not on file  . Food insecurity - worry: Not on file  . Food insecurity - inability: Not on file  . Transportation needs - medical: Not on file  . Transportation needs - non-medical: Not on file  Occupational History  . Occupation: [employer], sales   Tobacco Use  . Smoking status: Never Smoker  . Smokeless tobacco: Never Used  Substance and Sexual Activity  . Alcohol use: Yes    Comment: socially   . Drug use: No  . Sexual activity: Yes  Other Topics Concern  . Not on file  Social History Narrative   Lives w/ wife, 1 child and mother in law        Family History  Problem Relation Age of Onset  . Depression Sister        no suicide   . Stroke Father        ag ~ 52  . Coronary artery disease  Father        smoker, MI and a CABG at 24 y/o  . Colon cancer Neg Hx   . Prostate cancer Neg Hx   . Diabetes Neg Hx     BP (!) 147/89   Pulse 66   Ht 6\' 4"  (1.93 m)   Wt (!) 307 lb (139.3 kg)   BMI 37.37 kg/m   Review of Systems: See HPI above.     Objective:  Physical Exam:  Gen: NAD, comfortable in exam room  Left shoulder: No swelling, ecchymoses.  No gross deformity. Mild TTP proximal biceps tendon.  No other tenderness. Full IR.  Abduction and flexion only to 30 degrees, ER to 10 degrees.  Full passive motion. Pain with Ananias Pilgrim. Negative Yergasons. Strength 5/5 with IR.  No strength with empty can and ER.  + drop arm. Negative apprehension. NV intact distally.  Right shoulder: No swelling, ecchymoses.  No gross deformity. No TTP. FROM. Strength 5/5 with empty can and resisted internal/external rotation. NV intact distally.   Assessment & Plan:  1. Left shoulder injury - independently reviewed radiographs and no evidence fracture.  MSK u/s shows high grade partial tear of supraspinatus though some fibers appear intact.  Infraspinatus appears intact.  Subscapularis difficult to visualize - partial tear of biceps tendon also.  Given severe lack of motion will go ahead with MRI to further assess.  Ibuprofen if needed.

## 2017-06-01 NOTE — Assessment & Plan Note (Signed)
independently reviewed radiographs and no evidence fracture.  MSK u/s shows high grade partial tear of supraspinatus though some fibers appear intact.  Infraspinatus appears intact.  Subscapularis difficult to visualize - partial tear of biceps tendon also.  Given severe lack of motion will go ahead with MRI to further assess.  Ibuprofen if needed.

## 2017-06-02 ENCOUNTER — Other Ambulatory Visit: Payer: 59

## 2017-06-09 ENCOUNTER — Ambulatory Visit
Admission: RE | Admit: 2017-06-09 | Discharge: 2017-06-09 | Disposition: A | Discharge status: Home / Self Care | Payer: 59 | Source: Ambulatory Visit | Attending: Family Medicine | Admitting: Family Medicine

## 2017-06-09 DIAGNOSIS — M25512 Pain in left shoulder: Secondary | ICD-10-CM

## 2017-06-09 DIAGNOSIS — M75122 Complete rotator cuff tear or rupture of left shoulder, not specified as traumatic: Secondary | ICD-10-CM | POA: Diagnosis not present

## 2017-06-12 MED FILL — buPROPion HCL ER (XL) 150 M: 150 | 30 days supply | Qty: 30 | Fill #1

## 2017-06-12 MED FILL — SERTRALINE HCL 100 MG TAB: 100 | 30 days supply | Qty: 30 | Fill #1

## 2017-06-12 MED FILL — ADDERALL XR 20 MG CAP SA: 20 | 30 days supply | Qty: 60 | Fill #0

## 2017-06-12 NOTE — Addendum Note (Signed)
Addended by: Kathi SimpersWISE, Zahara Rembert F on: 06/12/2017 04:07 PM   Modules accepted: Orders

## 2017-06-15 DIAGNOSIS — M75112 Incomplete rotator cuff tear or rupture of left shoulder, not specified as traumatic: Secondary | ICD-10-CM | POA: Diagnosis not present

## 2017-06-20 MED FILL — lamoTRIgine 100 MG TABS: 100 | 30 days supply | Qty: 45 | Fill #0

## 2017-07-05 ENCOUNTER — Encounter (HOSPITAL_BASED_OUTPATIENT_CLINIC_OR_DEPARTMENT_OTHER): Payer: Self-pay | Admitting: *Deleted

## 2017-07-06 ENCOUNTER — Encounter (HOSPITAL_BASED_OUTPATIENT_CLINIC_OR_DEPARTMENT_OTHER)
Admission: RE | Admit: 2017-07-06 | Discharge: 2017-07-06 | Disposition: A | Discharge status: Home / Self Care | Payer: 59 | Source: Ambulatory Visit | Attending: Orthopedic Surgery | Admitting: Orthopedic Surgery

## 2017-07-06 ENCOUNTER — Other Ambulatory Visit: Payer: Self-pay

## 2017-07-06 DIAGNOSIS — I10 Essential (primary) hypertension: Secondary | ICD-10-CM | POA: Diagnosis present

## 2017-07-06 DIAGNOSIS — M75102 Unspecified rotator cuff tear or rupture of left shoulder, not specified as traumatic: Secondary | ICD-10-CM | POA: Diagnosis not present

## 2017-07-06 DIAGNOSIS — I4510 Unspecified right bundle-branch block: Secondary | ICD-10-CM | POA: Diagnosis not present

## 2017-07-06 DIAGNOSIS — R9431 Abnormal electrocardiogram [ECG] [EKG]: Secondary | ICD-10-CM | POA: Diagnosis not present

## 2017-07-06 NOTE — Progress Notes (Signed)
[facility] Reviewed results with Dr. Michelle Piperssey who approved results.

## 2017-07-12 ENCOUNTER — Encounter (HOSPITAL_BASED_OUTPATIENT_CLINIC_OR_DEPARTMENT_OTHER): Payer: Self-pay | Admitting: Anesthesiology

## 2017-07-12 ENCOUNTER — Ambulatory Visit (HOSPITAL_BASED_OUTPATIENT_CLINIC_OR_DEPARTMENT_OTHER)
Admission: RE | Admit: 2017-07-12 | Discharge: 2017-07-12 | Disposition: A | Discharge status: Home / Self Care | Payer: 59 | Source: Ambulatory Visit | Attending: Orthopedic Surgery | Admitting: Orthopedic Surgery

## 2017-07-12 ENCOUNTER — Ambulatory Visit (HOSPITAL_BASED_OUTPATIENT_CLINIC_OR_DEPARTMENT_OTHER): Payer: 59 | Admitting: Certified Registered"

## 2017-07-12 ENCOUNTER — Other Ambulatory Visit: Payer: Self-pay

## 2017-07-12 ENCOUNTER — Encounter (HOSPITAL_BASED_OUTPATIENT_CLINIC_OR_DEPARTMENT_OTHER)
Admission: RE | Disposition: A | Discharge status: Home / Self Care | Payer: Self-pay | Source: Ambulatory Visit | Attending: Orthopedic Surgery

## 2017-07-12 DIAGNOSIS — Z9989 Dependence on other enabling machines and devices: Secondary | ICD-10-CM | POA: Diagnosis not present

## 2017-07-12 DIAGNOSIS — S46012A Strain of muscle(s) and tendon(s) of the rotator cuff of left shoulder, initial encounter: Secondary | ICD-10-CM | POA: Diagnosis not present

## 2017-07-12 DIAGNOSIS — M7542 Impingement syndrome of left shoulder: Secondary | ICD-10-CM | POA: Insufficient documentation

## 2017-07-12 DIAGNOSIS — G4730 Sleep apnea, unspecified: Secondary | ICD-10-CM | POA: Diagnosis not present

## 2017-07-12 DIAGNOSIS — E7800 Pure hypercholesterolemia, unspecified: Secondary | ICD-10-CM | POA: Diagnosis not present

## 2017-07-12 DIAGNOSIS — Z7982 Long term (current) use of aspirin: Secondary | ICD-10-CM | POA: Diagnosis not present

## 2017-07-12 DIAGNOSIS — S46901A Unspecified injury of unspecified muscle, fascia and tendon at shoulder and upper arm level, right arm, initial encounter: Secondary | ICD-10-CM | POA: Diagnosis not present

## 2017-07-12 DIAGNOSIS — I10 Essential (primary) hypertension: Secondary | ICD-10-CM | POA: Diagnosis not present

## 2017-07-12 DIAGNOSIS — F329 Major depressive disorder, single episode, unspecified: Secondary | ICD-10-CM | POA: Insufficient documentation

## 2017-07-12 DIAGNOSIS — Z96653 Presence of artificial knee joint, bilateral: Secondary | ICD-10-CM | POA: Diagnosis not present

## 2017-07-12 DIAGNOSIS — E785 Hyperlipidemia, unspecified: Secondary | ICD-10-CM | POA: Diagnosis not present

## 2017-07-12 DIAGNOSIS — M75102 Unspecified rotator cuff tear or rupture of left shoulder, not specified as traumatic: Secondary | ICD-10-CM | POA: Diagnosis not present

## 2017-07-12 DIAGNOSIS — G8918 Other acute postprocedural pain: Secondary | ICD-10-CM | POA: Diagnosis not present

## 2017-07-12 DIAGNOSIS — Z79899 Other long term (current) drug therapy: Secondary | ICD-10-CM | POA: Insufficient documentation

## 2017-07-12 DIAGNOSIS — M24112 Other articular cartilage disorders, left shoulder: Secondary | ICD-10-CM | POA: Diagnosis not present

## 2017-07-12 DIAGNOSIS — M75122 Complete rotator cuff tear or rupture of left shoulder, not specified as traumatic: Secondary | ICD-10-CM | POA: Diagnosis present

## 2017-07-12 DIAGNOSIS — M25512 Pain in left shoulder: Secondary | ICD-10-CM | POA: Diagnosis present

## 2017-07-12 HISTORY — PX: SHOULDER ARTHROSCOPY WITH ROTATOR CUFF REPAIR AND SUBACROMIAL DECOMPRESSION: SHX5686

## 2017-07-12 HISTORY — DX: Complete rotator cuff tear or rupture of left shoulder, not specified as traumatic: M75.122

## 2017-07-12 SURGERY — SHOULDER ARTHROSCOPY WITH ROTATOR CUFF REPAIR AND SUBACROMIAL DECOMPRESSION
Anesthesia: General | Site: Shoulder | Laterality: Left

## 2017-07-12 MED ORDER — CEFAZOLIN SODIUM-DEXTROSE 2-4 GM/100ML-% IV SOLN: 2.0000 g | INTRAVENOUS | Status: DC

## 2017-07-12 MED ORDER — SODIUM CHLORIDE 0.9 % IR SOLN
Status: DC | PRN
  Administered 2017-07-12: 9000 mL

## 2017-07-12 MED ORDER — PROPOFOL 10 MG/ML IV BOLUS
INTRAVENOUS | Status: AC
  Filled 2017-07-12: qty 40

## 2017-07-12 MED ORDER — HYDROMORPHONE HCL 1 MG/ML IJ SOLN: 0.2500 mg | INTRAMUSCULAR | Status: DC | PRN

## 2017-07-12 MED ORDER — MIDAZOLAM HCL 2 MG/2ML IJ SOLN
INTRAMUSCULAR | Status: AC
  Filled 2017-07-12: qty 2

## 2017-07-12 MED ORDER — PHENYLEPHRINE HCL 10 MG/ML IJ SOLN
INTRAVENOUS | Status: DC | PRN
  Administered 2017-07-12: 50 ug/min via INTRAVENOUS

## 2017-07-12 MED ORDER — ONDANSETRON HCL 4 MG/2ML IJ SOLN
INTRAMUSCULAR | Status: AC
  Filled 2017-07-12: qty 2

## 2017-07-12 MED ORDER — BUPIVACAINE-EPINEPHRINE (PF) 0.5% -1:200000 IJ SOLN
INTRAMUSCULAR | Status: DC | PRN
  Administered 2017-07-12: 20 mL via PERINEURAL

## 2017-07-12 MED ORDER — ROCURONIUM BROMIDE 100 MG/10ML IV SOLN
INTRAVENOUS | Status: DC | PRN
  Administered 2017-07-12: 60 mg via INTRAVENOUS

## 2017-07-12 MED ORDER — PHENYLEPHRINE 40 MCG/ML (10ML) SYRINGE FOR IV PUSH (FOR BLOOD PRESSURE SUPPORT)
PREFILLED_SYRINGE | INTRAVENOUS | Status: DC | PRN
  Administered 2017-07-12 (×5): 80 ug via INTRAVENOUS

## 2017-07-12 MED ORDER — CEFAZOLIN SODIUM-DEXTROSE 2-4 GM/100ML-% IV SOLN
INTRAVENOUS | Status: AC
  Filled 2017-07-12: qty 200

## 2017-07-12 MED ORDER — CEFAZOLIN SODIUM-DEXTROSE 1-4 GM/50ML-% IV SOLN
INTRAVENOUS | Status: AC
  Filled 2017-07-12: qty 50

## 2017-07-12 MED ORDER — EPHEDRINE 5 MG/ML INJ
INTRAVENOUS | Status: AC
  Filled 2017-07-12: qty 10

## 2017-07-12 MED ORDER — FENTANYL CITRATE (PF) 100 MCG/2ML IJ SOLN
INTRAMUSCULAR | Status: AC
  Filled 2017-07-12: qty 2

## 2017-07-12 MED ORDER — SUCCINYLCHOLINE CHLORIDE 200 MG/10ML IV SOSY
PREFILLED_SYRINGE | INTRAVENOUS | Status: DC | PRN
  Administered 2017-07-12: 140 mg via INTRAVENOUS

## 2017-07-12 MED ORDER — OXYCODONE HCL 5 MG PO TABS: 5.0000 mg | ORAL_TABLET | ORAL | 0 refills | Status: DC | PRN

## 2017-07-12 MED ORDER — LACTATED RINGERS IV SOLN: INTRAVENOUS | Status: DC

## 2017-07-12 MED ORDER — FENTANYL CITRATE (PF) 100 MCG/2ML IJ SOLN
50.0000 ug | INTRAMUSCULAR | Status: DC | PRN
  Administered 2017-07-12: 50 ug via INTRAVENOUS

## 2017-07-12 MED ORDER — LIDOCAINE 2% (20 MG/ML) 5 ML SYRINGE
INTRAMUSCULAR | Status: DC | PRN
  Administered 2017-07-12: 100 mg via INTRAVENOUS

## 2017-07-12 MED ORDER — SUGAMMADEX SODIUM 500 MG/5ML IV SOLN
INTRAVENOUS | Status: AC
  Filled 2017-07-12: qty 5

## 2017-07-12 MED ORDER — PHENYLEPHRINE 40 MCG/ML (10ML) SYRINGE FOR IV PUSH (FOR BLOOD PRESSURE SUPPORT)
PREFILLED_SYRINGE | INTRAVENOUS | Status: AC
  Filled 2017-07-12: qty 10

## 2017-07-12 MED ORDER — LIDOCAINE 2% (20 MG/ML) 5 ML SYRINGE
INTRAMUSCULAR | Status: AC
  Filled 2017-07-12: qty 5

## 2017-07-12 MED ORDER — LACTATED RINGERS IV SOLN
INTRAVENOUS | Status: DC
  Administered 2017-07-12 (×2): via INTRAVENOUS

## 2017-07-12 MED ORDER — MIDAZOLAM HCL 2 MG/2ML IJ SOLN
1.0000 mg | INTRAMUSCULAR | Status: DC | PRN
  Administered 2017-07-12: 2 mg via INTRAVENOUS

## 2017-07-12 MED ORDER — PHENYLEPHRINE HCL 10 MG/ML IJ SOLN
INTRAMUSCULAR | Status: AC
  Filled 2017-07-12: qty 2

## 2017-07-12 MED ORDER — MEPERIDINE HCL 25 MG/ML IJ SOLN: 6.2500 mg | INTRAMUSCULAR | Status: DC | PRN

## 2017-07-12 MED ORDER — SUGAMMADEX SODIUM 200 MG/2ML IV SOLN
INTRAVENOUS | Status: DC | PRN
  Administered 2017-07-12: 300 mg via INTRAVENOUS

## 2017-07-12 MED ORDER — DEXAMETHASONE SODIUM PHOSPHATE 10 MG/ML IJ SOLN
INTRAMUSCULAR | Status: DC | PRN
  Administered 2017-07-12: 10 mg via INTRAVENOUS

## 2017-07-12 MED ORDER — SCOPOLAMINE 1 MG/3DAYS TD PT72: 1.0000 | MEDICATED_PATCH | Freq: Once | TRANSDERMAL | Status: DC | PRN

## 2017-07-12 MED ORDER — ROCURONIUM BROMIDE 10 MG/ML (PF) SYRINGE
PREFILLED_SYRINGE | INTRAVENOUS | Status: AC
  Filled 2017-07-12: qty 5

## 2017-07-12 MED ORDER — ONDANSETRON HCL 4 MG PO TABS: 4.0000 mg | ORAL_TABLET | Freq: Three times a day (TID) | ORAL | 0 refills | Status: DC | PRN

## 2017-07-12 MED ORDER — DEXAMETHASONE SODIUM PHOSPHATE 10 MG/ML IJ SOLN
INTRAMUSCULAR | Status: AC
  Filled 2017-07-12: qty 1

## 2017-07-12 MED ORDER — EPHEDRINE SULFATE-NACL 50-0.9 MG/10ML-% IV SOSY
PREFILLED_SYRINGE | INTRAVENOUS | Status: DC | PRN
  Administered 2017-07-12 (×2): 10 mg via INTRAVENOUS
  Administered 2017-07-12 (×2): 15 mg via INTRAVENOUS

## 2017-07-12 MED ORDER — ONDANSETRON HCL 4 MG/2ML IJ SOLN
INTRAMUSCULAR | Status: DC | PRN
  Administered 2017-07-12: 4 mg via INTRAVENOUS

## 2017-07-12 MED ORDER — DEXTROSE 5 % IV SOLN
3.0000 g | INTRAVENOUS | Status: AC
  Administered 2017-07-12: 3 g via INTRAVENOUS

## 2017-07-12 MED ORDER — PROPOFOL 10 MG/ML IV BOLUS
INTRAVENOUS | Status: DC | PRN
  Administered 2017-07-12: 200 mg via INTRAVENOUS

## 2017-07-12 MED ORDER — PROMETHAZINE HCL 25 MG/ML IJ SOLN: 6.2500 mg | INTRAMUSCULAR | Status: DC | PRN

## 2017-07-12 MED ORDER — BACLOFEN 10 MG PO TABS: 10.0000 mg | ORAL_TABLET | Freq: Three times a day (TID) | ORAL | 0 refills | Status: DC

## 2017-07-12 MED ORDER — SENNA-DOCUSATE SODIUM 8.6-50 MG PO TABS: 2.0000 | ORAL_TABLET | Freq: Every day | ORAL | 1 refills | Status: DC

## 2017-07-12 MED FILL — oxyCODONE HCL 5 MG TABS: 5 | 5 days supply | Qty: 30 | Fill #0

## 2017-07-12 MED FILL — ONDANSETRON HCL 4 MG TABLET: 4 | 3 days supply | Qty: 10 | Fill #0

## 2017-07-12 MED FILL — BACLOFEN 10 MG TABLET: 10 | 16 days supply | Qty: 50 | Fill #0

## 2017-07-12 SURGICAL SUPPLY — 68 items
ANCH SUT SWLK 19.1X4.75 (Anchor) ×3 IMPLANT
ANCHOR SUT BIO SW 4.75X19.1 (Anchor) ×6 IMPLANT
BLADE CUTTER GATOR 3.5 (BLADE) ×3 IMPLANT
BLADE GREAT WHITE 4.2 (BLADE) IMPLANT
BLADE GREAT WHITE 4.2MM (BLADE)
BLADE SURG 15 STRL LF DISP TIS (BLADE) IMPLANT
BLADE SURG 15 STRL SS (BLADE)
BUR OVAL 6.0 (BURR) ×2 IMPLANT
CANNULA 5.75X71 LONG (CANNULA) ×3 IMPLANT
CANNULA TWIST IN 8.25X7CM (CANNULA) ×2 IMPLANT
CLOSURE STERI-STRIP 1/2X4 (GAUZE/BANDAGES/DRESSINGS) ×1
CLSR STERI-STRIP ANTIMIC 1/2X4 (GAUZE/BANDAGES/DRESSINGS) ×2 IMPLANT
DECANTER SPIKE VIAL GLASS SM (MISCELLANEOUS) IMPLANT
DRAPE IMP U-DRAPE 54X76 (DRAPES) ×3 IMPLANT
DRAPE INCISE IOBAN 66X45 STRL (DRAPES) ×3 IMPLANT
DRAPE SHOULDER BEACH CHAIR (DRAPES) ×3 IMPLANT
DRAPE U-SHAPE 47X51 STRL (DRAPES) ×3 IMPLANT
DRSG PAD ABDOMINAL 8X10 ST (GAUZE/BANDAGES/DRESSINGS) ×3 IMPLANT
DURAPREP 26ML APPLICATOR (WOUND CARE) ×3 IMPLANT
ELECT REM PT RETURN 9FT ADLT (ELECTROSURGICAL)
ELECTRODE REM PT RTRN 9FT ADLT (ELECTROSURGICAL) IMPLANT
FIBERSTICK 2 (SUTURE) IMPLANT
GAUZE SPONGE 4X4 12PLY STRL (GAUZE/BANDAGES/DRESSINGS) ×3 IMPLANT
GLOVE BIO SURGEON STRL SZ 6.5 (GLOVE) ×1 IMPLANT
GLOVE BIO SURGEON STRL SZ8 (GLOVE) ×3 IMPLANT
GLOVE BIO SURGEONS STRL SZ 6.5 (GLOVE) ×1
GLOVE BIOGEL M STRL SZ7.5 (GLOVE) ×6 IMPLANT
GLOVE BIOGEL PI IND STRL 7.0 (GLOVE) IMPLANT
GLOVE BIOGEL PI IND STRL 8 (GLOVE) ×2 IMPLANT
GLOVE BIOGEL PI INDICATOR 7.0 (GLOVE) ×4
GLOVE BIOGEL PI INDICATOR 8 (GLOVE) ×10
GLOVE ORTHO TXT STRL SZ7.5 (GLOVE) ×3 IMPLANT
GOWN STRL REUS W/ TWL LRG LVL3 (GOWN DISPOSABLE) ×1 IMPLANT
GOWN STRL REUS W/ TWL XL LVL3 (GOWN DISPOSABLE) ×2 IMPLANT
GOWN STRL REUS W/TWL LRG LVL3 (GOWN DISPOSABLE) ×3
GOWN STRL REUS W/TWL XL LVL3 (GOWN DISPOSABLE) ×15
IMMOBILIZER SHOULDER FOAM XLGE (SOFTGOODS) IMPLANT
IV NS IRRIG 3000ML ARTHROMATIC (IV SOLUTION) ×24 IMPLANT
KIT SHOULDER TRACTION (DRAPES) ×3 IMPLANT
LASSO 90 CVE QUICKPAS (DISPOSABLE) ×2 IMPLANT
MANIFOLD NEPTUNE II (INSTRUMENTS) ×3 IMPLANT
NDL SCORPION MULTI FIRE (NEEDLE) IMPLANT
NEEDLE SCORPION MULTI FIRE (NEEDLE) ×3 IMPLANT
PACK ARTHROSCOPY DSU (CUSTOM PROCEDURE TRAY) ×3 IMPLANT
PACK BASIN DAY SURGERY FS (CUSTOM PROCEDURE TRAY) ×3 IMPLANT
PROBE BIPOLAR ATHRO 135MM 90D (MISCELLANEOUS) ×3 IMPLANT
SHEET MEDIUM DRAPE 40X70 STRL (DRAPES) ×3 IMPLANT
SLEEVE SCD COMPRESS KNEE MED (MISCELLANEOUS) ×3 IMPLANT
SLING ARM FOAM STRAP LRG (SOFTGOODS) IMPLANT
SLING ARM IMMOBILIZER LRG (SOFTGOODS) IMPLANT
SLING ARM IMMOBILIZER MED (SOFTGOODS) IMPLANT
SLING ARM MED ADULT FOAM STRAP (SOFTGOODS) IMPLANT
SLING ARM XL FOAM STRAP (SOFTGOODS) IMPLANT
SUT FIBERWIRE #2 38 T-5 BLUE (SUTURE) ×3
SUT MNCRL AB 4-0 PS2 18 (SUTURE) ×3 IMPLANT
SUT PDS AB 1 CT  36 (SUTURE)
SUT PDS AB 1 CT 36 (SUTURE) IMPLANT
SUT TIGER TAPE 7 IN WHITE (SUTURE) ×2 IMPLANT
SUT VIC AB 3-0 SH 27 (SUTURE)
SUT VIC AB 3-0 SH 27X BRD (SUTURE) IMPLANT
SUTURE FIBERWR #2 38 T-5 BLUE (SUTURE) IMPLANT
TAPE FIBER 2MM 7IN #2 BLUE (SUTURE) ×2 IMPLANT
TOWEL OR 17X24 6PK STRL BLUE (TOWEL DISPOSABLE) ×3 IMPLANT
TOWEL OR NON WOVEN STRL DISP B (DISPOSABLE) ×3 IMPLANT
TUBE CONNECTING 20'X1/4 (TUBING)
TUBE CONNECTING 20X1/4 (TUBING) IMPLANT
TUBING ARTHRO INFLOW-ONLY STRL (TUBING) ×3 IMPLANT
WATER STERILE IRR 1000ML POUR (IV SOLUTION) ×3 IMPLANT

## 2017-07-12 NOTE — Anesthesia Preprocedure Evaluation (Addendum)
Anesthesia Evaluation  Patient identified by MRN, date of birth, ID band Patient awake    Reviewed: Allergy & Precautions, NPO status , Patient's Chart, lab work & pertinent test results  Airway Mallampati: III  TM Distance: <3 FB Neck ROM: Full    Dental  (+) Teeth Intact, Dental Advisory Given   Pulmonary sleep apnea and Continuous Positive Airway Pressure Ventilation ,    breath sounds clear to auscultation       Cardiovascular hypertension, Pt. on home beta blockers  Rhythm:Regular Rate:Normal     Neuro/Psych PSYCHIATRIC DISORDERS Depression    GI/Hepatic negative GI ROS, Neg liver ROS,   Endo/Other  negative endocrine ROS  Renal/GU negative Renal ROS     Musculoskeletal  (+) Arthritis ,   Abdominal Normal abdominal exam  (+)   Peds  Hematology negative hematology ROS (+)   Anesthesia Other Findings - HLD  Reproductive/Obstetrics                            Lab Results  Component Value Date   WBC 6.4 10/03/2016   HGB 15.4 10/03/2016   HCT 46.9 10/03/2016   MCV 83.4 10/03/2016   PLT 244.0 10/03/2016   Lab Results  Component Value Date   CREATININE 1.06 04/11/2017   BUN 18 04/11/2017   NA 138 04/11/2017   K 4.2 04/11/2017   CL 101 04/11/2017   CO2 32 04/11/2017   Lab Results  Component Value Date   INR 1.08 03/05/2014   INR 1.05 12/23/2012   EKG: normal sinus rhythm.  Anesthesia Physical Anesthesia Plan  ASA: II  Anesthesia Plan: General   Post-op Pain Management: GA combined w/ Regional for post-op pain   Induction: Intravenous  PONV Risk Score and Plan: 3 and Ondansetron, Dexamethasone and Midazolam  Airway Management Planned: Oral ETT and Video Laryngoscope Planned  Additional Equipment: None  Intra-op Plan:   Post-operative Plan: Extubation in OR  Informed Consent: I have reviewed the patients History and Physical, chart, labs and discussed the  procedure including the risks, benefits and alternatives for the proposed anesthesia with the patient or authorized representative who has indicated his/her understanding and acceptance.   Dental advisory given  Plan Discussed with: CRNA  Anesthesia Plan Comments:        Anesthesia Quick Evaluation

## 2017-07-12 NOTE — Anesthesia Procedure Notes (Signed)
Procedure Name: Intubation Date/Time: 07/12/2017 11:33 AM Performed by: Marny Lowensteinapozzi, Sabatino Williard W, CRNA Pre-anesthesia Checklist: Patient identified, Emergency Drugs available, Suction available, Patient being monitored and Timeout performed Patient Re-evaluated:Patient Re-evaluated prior to induction Oxygen Delivery Method: Circle system utilized Preoxygenation: Pre-oxygenation with 100% oxygen Induction Type: IV induction Ventilation: Oral airway inserted - appropriate to patient size and Mask ventilation without difficulty Laryngoscope Size: Glidescope and 4 Grade View: Grade I Tube type: Oral Tube size: 8.0 mm Number of attempts: 1 Airway Equipment and Method: Patient positioned with wedge pillow Placement Confirmation: ETT inserted through vocal cords under direct vision,  positive ETCO2,  CO2 detector and breath sounds checked- equal and bilateral Secured at: 23 cm Tube secured with: Tape Dental Injury: Teeth and Oropharynx as per pre-operative assessment

## 2017-07-12 NOTE — Progress Notes (Signed)
Assisted Dr. Hollis with left, ultrasound guided, interscalene  block. Side rails up, monitors on throughout procedure. See vital signs in flow sheet. Tolerated Procedure well. 

## 2017-07-12 NOTE — Transfer of Care (Signed)
Immediate Anesthesia Transfer of Care Note  Patient: James Boone  Procedure(s) Performed: LEFT SHOULDER ARTHROSCOPY DEBRIDEMENT, ACROMIOPLASTY, ROTATOR CUFF REPAIR (Left Shoulder)  Patient Location: PACU  Anesthesia Type:General  Level of Consciousness: drowsy and patient cooperative  Airway & Oxygen Therapy: Patient Spontanous Breathing and Patient connected to face mask oxygen  Post-op Assessment: Report given to RN and Post -op Vital signs reviewed and stable  Post vital signs: Reviewed and stable  Last Vitals:  Vitals:   07/12/17 1035 07/12/17 1444  BP: 119/84   Pulse: (!) 57 67  Resp: (!) 23 (!) 26  Temp:  36.9 C  SpO2: 99% 95%    Last Pain:  Vitals:   07/12/17 1014  TempSrc: Oral  PainSc: 2       Patients Stated Pain Goal: 2 (07/12/17 1014)  Complications: No apparent anesthesia complications

## 2017-07-12 NOTE — Discharge Instructions (Signed)
Post Anesthesia Home Care Instructions  Activity: Get plenty of rest for the remainder of the day. A responsible individual must stay with you for 24 hours following the procedure.  For the next 24 hours, DO NOT: -Drive a car -Advertising copywriter -Drink alcoholic beverages -Take any medication unless instructed by your physician -Make any legal decisions or sign important papers.  Meals: Start with liquid foods such as gelatin or soup. Progress to regular foods as tolerated. Avoid greasy, spicy, heavy foods. If nausea and/or vomiting occur, drink only clear liquids until the nausea and/or vomiting subsides. Call your physician if vomiting continues.  Special Instructions/Symptoms: Your throat may feel dry or sore from the anesthesia or the breathing tube placed in your throat during surgery. If this causes discomfort, gargle with warm salt water. The discomfort should disappear within 24 hours.  If you had a scopolamine patch placed behind your ear for the management of post- operative nausea and/or vomiting:  1. The medication in the patch is effective for 72 hours, after which it should be removed.  Wrap patch in a tissue and discard in the trash. Wash hands thoroughly with soap and water. 2. You may remove the patch earlier than 72 hours if you experience unpleasant side effects which may include dry mouth, dizziness or visual disturbances. 3. Avoid touching the patch. Wash your hands with soap and water after contact with the patch.        Regional Anesthesia Blocks  1. Numbness or the inability to move the "blocked" extremity may last from 3-48 hours after placement. The length of time depends on the medication injected and your individual response to the medication. If the numbness is not going away after 48 hours, call your surgeon.  2. The extremity that is blocked will need to be protected until the numbness is gone and the  Strength has returned. Because you cannot feel it,  you will need to take extra care to avoid injury. Because it may be weak, you may have difficulty moving it or using it. You may not know what position it is in without looking at it while the block is in effect.  3. For blocks in the legs and feet, returning to weight bearing and walking needs to be done carefully. You will need to wait until the numbness is entirely gone and the strength has returned. You should be able to move your leg and foot normally before you try and bear weight or walk. You will need someone to be with you when you first try to ensure you do not fall and possibly risk injury.  4. Bruising and tenderness at the needle site are common side effects and will resolve in a few days.  5. Persistent numbness or new problems with movement should be communicated to the surgeon or the [facility] [phone] [facility] [phone]).   Diet: As you were doing prior to hospitalization   Shower:  May shower but keep the wounds dry, use an occlusive plastic wrap, NO SOAKING IN TUB.  If the bandage gets wet, change with a clean dry gauze.  If you have a splint on, leave the splint in place and keep the splint dry with a plastic bag.  Dressing:  You may change your dressing 3-5 days after surgery, unless you have a splint.  If you have a splint, then just leave the splint in place and we will change your bandages during your first follow-up appointment.  If you had hand or foot surgery, we will plan to remove your stitches in about 2 weeks in the office.  For all other surgeries, there are sticky tapes (steri-strips) on your wounds and all the stitches are absorbable.  Leave the steri-strips in place when changing your dressings, they will peel off with time, usually 2-3 weeks.  Activity:  Increase activity slowly as tolerated, but follow the weight bearing instructions below.  The rules on driving is that you can not be taking narcotics while you drive,  and you must feel in control of the vehicle.    Weight Bearing:   Sling at all times except hygiene.    To prevent constipation: you may use a stool softener such as -  Colace (over the counter) 100 mg by mouth twice a day  Drink plenty of fluids (prune juice may be helpful) and high fiber foods Miralax (over the counter) for constipation as needed.    Itching:  If you experience itching with your medications, try taking only a single pain pill, or even half a pain pill at a time.  You may take up to 10 pain pills per day, and you can also use benadryl over the counter for itching or also to help with sleep.   Precautions:  If you experience chest pain or shortness of breath - call 911 immediately for transfer to the hospital emergency department!!  If you develop a fever greater that 101 F, purulent drainage from wound, increased redness or drainage from wound, or calf pain -- Call the office at [phone]                                                Follow- Up Appointment:  Please call for an appointment to be seen in 2 weeks [place] - [phone]

## 2017-07-12 NOTE — Anesthesia Postprocedure Evaluation (Signed)
Anesthesia Post Note  Patient: Rush Landmarkan C Stirling  Procedure(s) Performed: LEFT SHOULDER ARTHROSCOPY DEBRIDEMENT, ACROMIOPLASTY, ROTATOR CUFF REPAIR (Left Shoulder)     Patient location during evaluation: PACU Anesthesia Type: General Level of consciousness: awake and alert Pain management: pain level controlled Vital Signs Assessment: post-procedure vital signs reviewed and stable Respiratory status: spontaneous breathing, nonlabored ventilation, respiratory function stable and patient connected to nasal cannula oxygen Cardiovascular status: blood pressure returned to baseline and stable Postop Assessment: no apparent nausea or vomiting Anesthetic complications: no    Last Vitals:  Vitals:   07/12/17 1530 07/12/17 1531  BP:  (!) 94/54  Pulse: 69   Resp: (!) 22   Temp:    SpO2: 93%     Last Pain:  Vitals:   07/12/17 1515  TempSrc:   PainSc: 0-No pain                 Phillips Groutarignan, Elleah Hemsley

## 2017-07-12 NOTE — Op Note (Signed)
07/12/2017  2:40 PM  PATIENT:  James Boone    PRE-OPERATIVE DIAGNOSIS: Left shoulder.  Massive subscapularis tear with supraspinatus involvement and biceps tendon instability with impingement syndrome  POST-OPERATIVE DIAGNOSIS:  Same  PROCEDURE: Left shoulder arthroscopy with extensive debridement, biceps Tina lysis, subscapularis repair, supraspinatus repair, acromioplasty.  SURGEON:  [name], MD  PHYSICIAN ASSISTANT: Janace Litten, OPA-C, present and scrubbed throughout the case, critical for completion in a timely fashion, and for retraction, instrumentation, and closure.  ANESTHESIA:   General with interscalene block.James Boone  PREOPERATIVE INDICATIONS:  James Boone is a  57 y.o. male with a diagnosis of left subscapularis tear with supraspinatus tear who failed conservative measures and elected for surgical management.    The risks benefits and alternatives were discussed with the patient preoperatively including but not limited to the risks of infection, recurrent tear, incomplete relief of symptoms, bleeding, nerve injury, cardiopulmonary complications, the need for revision surgery, among others, and the patient was willing to proceed.  ESTIMATED BLOOD LOSS: 50ml  OPERATIVE IMPLANTS: Arthrex biocomposite 4.39mm SwiveLock x 1 for the subscuapularis, 2 for the supraspinatus and infraspinatus with an inverted fiber tape for the subscapularis, and inverted fiber tape for the infraspinatus, and an inverted fiber tape and FiberWire for the supraspinatus.  OPERATIVE FINDINGS: He did have a fair amount of stiffness during examination under anesthesia.  I was able to achieve lysis of adhesions with manipulation which yielded full motion.  Glenohumeral articular cartilage was in good condition.  The biceps tendon was present, but unstable, and there was no pulley.  The subscapularis had extreme retraction to the level of the glenoid, and also the supraspinatus was badly torn with a bilaminar  tear extending back to the level of the glenoid.  This was a reverse L-shaped type tear.  The tendon quality was mediocre, mobility was quite difficult, bone quality was reasonably good.  OPERATIVE PROCEDURE: The patient was brought to the operating room and placed in the supine position.  General anesthesia was administered.  IV antibiotics was given.  He was turned in the semilateral decubitus position and all bony prominences padded.  The left upper extremity was manipulated and then prepped and draped in usual sterile fashion.  15 pounds of traction were utilized.  Timeout performed.  Diagnostic arthroscopy carried out with the above named findings.  I used the arthroscopic basket to release the biceps tendon.  I had to mobilize the subscapularis tendon, and I used a curved suture passer as well as a 2 anterior cannulas to secure the subscapularis.  I used a bur to prepare the lesser tuberosity.  I was able to advance the tendon back to appropriate position.  Satisfactory reduction of the subscapularis was achieved.  Then went to the subscap subacromial space, and I performed a complete bursectomy.  CA ligament release was performed as well as an acromioplasty and a light tuboplasty.  I used a BirdBeak suture passer from posteriorly to secure the infraspinatus, and then used a scorpion suture passer from the anterior portal to secure the tendon of the supraspinatus with the fiber tape and FiberWire.  This was an extremely challenging repair.  Was able to get it mobilized and reduced back to the tuberosity and secured it with a total of 2 anchors from above.  The rotator interval tissue at the level of the subscapularis repair was still deficient, and so future arthrograms certainly will demonstrate communication of the joint to the subacromial space through the anterior  tissue, however I was able to get the tendon back to the tuberosity and so hopefully we will achieve healing and restoration of pain control  and function.  The repair was confirmed from multiple viewpoints, and the portals were closed with Monocryl followed by Steri-Strips and sterile gauze.  He was awakened and returned to the PACU in stable and satisfactory condition.  There were no complications.

## 2017-07-12 NOTE — H&P (Signed)
PREOPERATIVE H&P  Chief Complaint: Left shoulder pain  HPI: James Boone is a 57 y.o. male who presents for preoperative history and physical with a diagnosis of left shoulder rotator cuff tear. Symptoms are rated as moderate to severe, and have been worsening.  This is significantly impairing activities of daily living.  He has elected for surgical management.  Injury occurred December 11 when he fell on ice.  Pain is rated 7/10.  Located laterally.  He has had a hydrocodone and ibuprofen, and he cannot function.  Past Medical History:  Diagnosis Date  . Abnormal liver function test   . Acquired cavovarus deformity of right foot 2016   Dr. Victorino DikeHewitt, using boot modification and [place] brace  . ADD (attention deficit disorder)   . ADD (attention deficit disorder)   . Allergic rhinitis   . Arthritis   . Back pain    lower  . Depression   . Hemorrhoid   . Hypercholesteremia   . Hyperglycemia   . Hyperlipidemia   . Hypertension   . Hypogonadism male   . Peripheral edema   . Sleep apnea 2005   dx in 2005 , on CPAP setting of 13  . Varicose veins    both legs   Past Surgical History:  Procedure Laterality Date  . HERNIA REPAIR  2001   abdominal hernia  . KNEE ARTHROSCOPY  05/2008   left, Dr.Geoffrey  . KNEE ARTHROSCOPY  2008   right, Dr.Geoffrey  . TOTAL ANKLE REPLACEMENT Right 2016  . TOTAL KNEE ARTHROPLASTY Left 12/30/2012   Procedure: LEFT TOTAL KNEE ARTHROPLASTY;  Surgeon: Loanne DrillingFrank V Aluisio, MD;  Location: [facility];  Service: Orthopedics;  Laterality: Left;  . TOTAL KNEE ARTHROPLASTY Right 03/16/2014   Procedure: RIGHT TOTAL KNEE ARTHROPLASTY;  Surgeon: Loanne DrillingFrank Aluisio V, MD;  Location: [facility];  Service: Orthopedics;  Laterality: Right;  Marland Kitchen. VASECTOMY  1990  . wisdom teeth extractiion     Social History   Socioeconomic History  . Marital status: Married    Spouse name: None  . Number of children: 2  . Years of education: None  . Highest education level: None  Social Needs   . Financial resource strain: None  . Food insecurity - worry: None  . Food insecurity - inability: None  . Transportation needs - medical: None  . Transportation needs - non-medical: None  Occupational History  . Occupation: [employer], sales   Tobacco Use  . Smoking status: Never Smoker  . Smokeless tobacco: Never Used  Substance and Sexual Activity  . Alcohol use: Yes    Comment: socially   . Drug use: No  . Sexual activity: Yes  Other Topics Concern  . None  Social History Narrative   Lives w/ wife, 1 child and mother in law       Family History  Problem Relation Age of Onset  . Depression Sister        no suicide   . Stroke Father        ag ~ 4264  . Coronary artery disease Father        smoker, MI and a CABG at 59 y/o  . Colon cancer Neg Hx   . Prostate cancer Neg Hx   . Diabetes Neg Hx    No Known Allergies Prior to Admission medications   Medication Sig Start Date End Date Taking? Authorizing Provider  amphetamine-dextroamphetamine (ADDERALL XR) 25 MG 24 hr capsule Take 50 mg by mouth every morning.  Yes [provider]  aspirin EC 81 MG tablet Take 81 mg by mouth daily.   Yes [provider]  atorvastatin (LIPITOR) 10 MG tablet Take 1 tablet (10 mg total) by mouth daily. 10/10/16  Yes Paz, Nolon Rod, MD  buPROPion (WELLBUTRIN XL) 150 MG 24 hr tablet Take 150 mg by mouth every morning.    Yes [provider]  lamoTRIgine (LAMICTAL) 100 MG tablet Take 100 mg by mouth every evening.    Yes [provider]  metoprolol tartrate (LOPRESSOR) 50 MG tablet TAKE 1 TABLET BY MOUTH TWICE DAILY 05/07/17  Yes Bradd Canary, MD  sertraline (ZOLOFT) 100 MG tablet Take 100 mg by mouth every morning.    Yes [provider]  testosterone cypionate (DEPOTESTOSTERONE CYPIONATE) 200 MG/ML injection Inject 1 mL (200 mg total) into the muscle every 14 (fourteen) days. 04/23/17  Yes Paz, Nolon Rod, MD  triamcinolone cream (KENALOG) 0.1 %   05/29/17  Yes [provider]     Positive ROS: All other systems have been reviewed and were otherwise negative with the exception of those mentioned in the HPI and as above.  Physical Exam: General: Alert, no acute distress Cardiovascular: No pedal edema Respiratory: No cyanosis, no use of accessory musculature GI: No organomegaly, abdomen is soft and non-tender Skin: No lesions in the area of chief complaint Neurologic: Sensation intact distally Psychiatric: Patient is competent for consent with normal mood and affect Lymphatic: No axillary or cervical lymphadenopathy  MUSCULOSKELETAL: Left shoulder active motion 0-170 degrees with positive drop arm sign, no pain over the [facility] joint, weak supraspinatus testing.  External rotation to 20 degrees.  Assessment: Left shoulder large rotator cuff tear, subacute with impingement syndrome   Plan: Plan for Procedure(s): LEFT SHOULDER ARTHROSCOPY DEBRIDEMENT, ACROMIOPLASTY, ROTATOR CUFF REPAIR  The risks benefits and alternatives were discussed with the patient including but not limited to the risks of nonoperative treatment, versus surgical intervention including infection, bleeding, nerve injury,  blood clots, cardiopulmonary complications, morbidity, mortality, among others, and they were willing to proceed.  We also discussed the risks for recurrent tear, incomplete relief of symptoms, persistent weakness, among others.  [name], MD Cell [phone]   07/12/2017 11:20 AM

## 2017-07-12 NOTE — Anesthesia Procedure Notes (Signed)
Anesthesia Regional Block: Interscalene brachial plexus block   Pre-Anesthetic Checklist: ,, timeout performed, Correct Patient, Correct Site, Correct Laterality, Correct Procedure, Correct Position, site marked, Risks and benefits discussed,  Surgical consent,  Pre-op evaluation,  At surgeon's request and post-op pain management  Laterality: Left  Prep: chloraprep       Needles:  Injection technique: Single-shot  Needle Type: Echogenic Needle     Needle Length: 9cm  Needle Gauge: 21     Additional Needles:   Procedures:,,,, ultrasound used (permanent image in chart),,,,  Narrative:  Start time: 07/12/2017 10:32 AM End time: 07/12/2017 10:42 AM Injection made incrementally with aspirations every 5 mL.  Performed by: Personally  Anesthesiologist: Shelton SilvasHollis, Trayshawn Durkin D, MD  Additional Notes: Patient tolerated the procedure well. Local anesthetic introduced in an incremental fashion under minimal resistance after negative aspirations. No paresthesias were elicited. After completion of the procedure, no acute issues were identified and patient continued to be monitored by RN.

## 2017-07-13 ENCOUNTER — Encounter (HOSPITAL_BASED_OUTPATIENT_CLINIC_OR_DEPARTMENT_OTHER): Payer: Self-pay | Admitting: Orthopedic Surgery

## 2017-07-13 NOTE — Addendum Note (Signed)
Addendum  created 07/13/17 0904 by Karen KitchensKelly, Tevan Marian M, CRNA   Charge Capture section accepted

## 2017-07-16 MED FILL — SERTRALINE HCL 100 MG TAB: 100 | 30 days supply | Qty: 30 | Fill #2

## 2017-07-17 MED FILL — buPROPion HCL ER (XL) 150 M: 150 | 30 days supply | Qty: 30 | Fill #2

## 2017-07-17 MED FILL — ADDERALL XR 20 MG CAP SA: 20 | 30 days supply | Qty: 60 | Fill #0

## 2017-07-27 DIAGNOSIS — S46012D Strain of muscle(s) and tendon(s) of the rotator cuff of left shoulder, subsequent encounter: Secondary | ICD-10-CM | POA: Diagnosis not present

## 2017-07-27 MED FILL — lamoTRIgine 100 MG TABS: 100 | 30 days supply | Qty: 45 | Fill #1

## 2017-08-10 ENCOUNTER — Ambulatory Visit: Payer: 59 | Admitting: Internal Medicine

## 2017-08-10 ENCOUNTER — Encounter: Payer: Self-pay | Admitting: Internal Medicine

## 2017-08-10 VITALS — BP 136/68 | HR 64 | Temp 98.0°F | Resp 14 | Ht 76.0 in | Wt 313.1 lb

## 2017-08-10 DIAGNOSIS — R739 Hyperglycemia, unspecified: Secondary | ICD-10-CM

## 2017-08-10 DIAGNOSIS — E291 Testicular hypofunction: Secondary | ICD-10-CM | POA: Diagnosis not present

## 2017-08-10 DIAGNOSIS — I10 Essential (primary) hypertension: Secondary | ICD-10-CM | POA: Diagnosis not present

## 2017-08-10 NOTE — Progress Notes (Signed)
Subjective:    Patient ID: James Boone, male    DOB: 1961/05/03, 57 y.o.   MRN: [id number]  DOS:  08/10/2017 Type of visit - description : rov Interval history: Left shoulder pain: Had surgery, recovering well Hypogonadism: Good compliance with medications. Patient wonders if he needs to continue with aspirin?. HTN, on metoprolol, ambulatory BPs normal   Review of Systems Denies chest pain or difficulty breathing. Lower extremity edema is minimal  Past Medical History:  Diagnosis Date  . Abnormal liver function test   . Acquired cavovarus deformity of right foot 2016   Dr. Victorino Dike, using boot modification and [place] brace  . ADD (attention deficit disorder)   . ADD (attention deficit disorder)   . Allergic rhinitis   . Arthritis   . Back pain    lower  . Complete rotator cuff tear of left shoulder 07/12/2017  . Depression   . Hemorrhoid   . Hypercholesteremia   . Hyperglycemia   . Hyperlipidemia   . Hypertension   . Hypogonadism male   . Peripheral edema   . Sleep apnea 2005   dx in 2005 , on CPAP setting of 13  . Varicose veins    both legs    Past Surgical History:  Procedure Laterality Date  . HERNIA REPAIR  2001   abdominal hernia  . KNEE ARTHROSCOPY  05/2008   left, Dr.Geoffrey  . KNEE ARTHROSCOPY  2008   right, Dr.Geoffrey  . SHOULDER ARTHROSCOPY WITH ROTATOR CUFF REPAIR AND SUBACROMIAL DECOMPRESSION Left 07/12/2017   Procedure: LEFT SHOULDER ARTHROSCOPY DEBRIDEMENT, ACROMIOPLASTY, ROTATOR CUFF REPAIR;  Surgeon: Teryl Lucy, MD;  Location: [facility];  Service: Orthopedics;  Laterality: Left;  . TOTAL ANKLE REPLACEMENT Right 2016  . TOTAL KNEE ARTHROPLASTY Left 12/30/2012   Procedure: LEFT TOTAL KNEE ARTHROPLASTY;  Surgeon: Loanne Drilling, MD;  Location: [facility];  Service: Orthopedics;  Laterality: Left;  . TOTAL KNEE ARTHROPLASTY Right 03/16/2014   Procedure: RIGHT TOTAL KNEE ARTHROPLASTY;  Surgeon: Loanne Drilling, MD;  Location: [facility];   Service: Orthopedics;  Laterality: Right;  Marland Kitchen VASECTOMY  1990  . wisdom teeth extractiion      Social History   Socioeconomic History  . Marital status: Married    Spouse name: Not on file  . Number of children: 2  . Years of education: Not on file  . Highest education level: Not on file  Occupational History  . Occupation: [employer], sales   Social Needs  . Financial resource strain: Not on file  . Food insecurity:    Worry: Not on file    Inability: Not on file  . Transportation needs:    Medical: Not on file    Non-medical: Not on file  Tobacco Use  . Smoking status: Never Smoker  . Smokeless tobacco: Never Used  Substance and Sexual Activity  . Alcohol use: Yes    Comment: socially   . Drug use: No  . Sexual activity: Yes  Lifestyle  . Physical activity:    Days per week: Not on file    Minutes per session: Not on file  . Stress: Not on file  Relationships  . Social connections:    Talks on phone: Not on file    Gets together: Not on file    Attends religious service: Not on file    Active member of club or organization: Not on file    Attends meetings of clubs or organizations: Not on  file    Relationship status: Not on file  . Intimate partner violence:    Fear of current or ex partner: Not on file    Emotionally abused: Not on file    Physically abused: Not on file    Forced sexual activity: Not on file  Other Topics Concern  . Not on file  Social History Narrative   Lives w/ wife, 1 child and mother in law          Allergies as of 08/10/2017   No Known Allergies     Medication List        Accurate as of 08/10/17  5:41 PM. Always use your most recent med list.          amphetamine-dextroamphetamine 25 MG 24 hr capsule Commonly known as:  ADDERALL XR Take 50 mg by mouth every morning.   aspirin EC 81 MG tablet Take 81 mg by mouth daily.   atorvastatin 10 MG tablet Commonly known as:  LIPITOR Take 1 tablet (10 mg total) by  mouth daily.   buPROPion 150 MG 24 hr tablet Commonly known as:  WELLBUTRIN XL Take 150 mg by mouth every morning.   lamoTRIgine 100 MG tablet Commonly known as:  LAMICTAL Take 100 mg by mouth every evening.   metoprolol tartrate 50 MG tablet Commonly known as:  LOPRESSOR TAKE 1 TABLET BY MOUTH TWICE DAILY   oxyCODONE 5 MG immediate release tablet Commonly known as:  ROXICODONE Take 1 tablet (5 mg total) by mouth every 4 (four) hours as needed for severe pain.   sennosides-docusate sodium 8.6-50 MG tablet Commonly known as:  SENOKOT-S Take 2 tablets by mouth daily.   sertraline 100 MG tablet Commonly known as:  ZOLOFT Take 100 mg by mouth every morning.   testosterone cypionate 200 MG/ML injection Commonly known as:  DEPOTESTOSTERONE CYPIONATE Inject 1 mL (200 mg total) into the muscle every 14 (fourteen) days.   triamcinolone cream 0.1 % Commonly known as:  KENALOG          Objective:   Physical Exam BP 136/68 (BP Location: Right Arm, Patient Position: Sitting, Cuff Size: Normal)   Pulse 64   Temp 98 F (36.7 C) (Oral)   Resp 14   Ht 6\' 4"  (1.93 m)   Wt (!) 313 lb 2 oz (142 kg)   SpO2 98%   BMI 38.11 kg/m  General:   Well developed, overweight appearing.  No distress HEENT:  Normocephalic . Face symmetric, atraumatic Lungs:  CTA B Normal respiratory effort, no intercostal retractions, no accessory muscle use. Heart: RRR,  no murmur.  No pretibial edema bilaterally  Skin: Not pale. Not jaundice Neurologic:  alert & oriented X3.  Speech normal, gait appropriate for age and unassisted Psych--  Cognition and judgment appear intact.  Cooperative with normal attention span and concentration.  Behavior appropriate. No anxious or depressed appearing.      Assessment & Plan:   Assessment Hyperglycemia  HTN-- lopressor Hyperlipidemia-- lipitor  Elevated LFTs Edema DJD OSA -- on CPAP Hypogonadism -- f/u pcp Depression, ADD. Used to see Dr.  Nolen MuMcKinney, now sees [facility]  PLAN:  Hyperglycemia: Last A1c satisfactory HTN: Continue Lopressor, well controlled. Hyperlipidemia: On Lipitor, last FLP satisfactory Hypogonadism: On testosterone 1 mL every 2 weeks.  Check free and total testosterone Obesity: Currently has a hard time exercising due to recent shoulder surgery.  Rec to get more active as well as he can. Aspirin?  He has HTN, hyperlipidemia, father  had a CAD at an early age, continue aspirin for now. RTC 11-19 CPX

## 2017-08-10 NOTE — Assessment & Plan Note (Signed)
Hyperglycemia: Last A1c satisfactory HTN: Continue Lopressor, well controlled. Hyperlipidemia: On Lipitor, last FLP satisfactory Hypogonadism: On testosterone 1 mL every 2 weeks.  Check free and total testosterone Obesity: Currently has a hard time exercising due to recent shoulder surgery.  Rec to get more active as well as he can. Aspirin?  He has HTN, hyperlipidemia, father had a CAD at an early age, continue aspirin for now. RTC 11-19 CPX

## 2017-08-10 NOTE — Patient Instructions (Signed)
GO TO THE LAB : Get the blood work     GO TO THE FRONT DESK Schedule your next appointment for a   physical exam by November 2019.

## 2017-08-10 NOTE — Progress Notes (Signed)
Pre visit review using our clinic review tool, if applicable. No additional management support is needed unless otherwise documented below in the visit note. 

## 2017-08-13 ENCOUNTER — Other Ambulatory Visit: Payer: Self-pay | Admitting: Internal Medicine

## 2017-08-13 ENCOUNTER — Other Ambulatory Visit: Payer: Self-pay | Admitting: Family Medicine

## 2017-08-13 LAB — TESTOSTERONE TOTAL,FREE,BIO, MALES
Albumin: 4 g/dL (ref 3.6–5.1)
SEX HORMONE BINDING: 23 nmol/L (ref 22–77)
TESTOSTERONE FREE: 170.6 pg/mL (ref 46.0–224.0)
Testosterone, Bioavailable: 313.8 ng/dL (ref >=110.0)
Testosterone: 807 ng/dL (ref 250–827)

## 2017-08-13 MED FILL — ATORVASTATIN 10 MG TABLET: 10 | 90 days supply | Qty: 90 | Fill #0

## 2017-08-13 MED FILL — METOPROLOL TARTRATE 50 MG T: 50 | 90 days supply | Qty: 180 | Fill #0

## 2017-08-15 MED FILL — buPROPion HCL ER (XL) 150 M: 150 | 30 days supply | Qty: 30 | Fill #0

## 2017-08-15 MED FILL — SERTRALINE HCL 100 MG TAB: 100 | 30 days supply | Qty: 30 | Fill #0

## 2017-08-15 MED FILL — ADDERALL XR 20 MG CAP SA: 20 | 30 days supply | Qty: 60 | Fill #0

## 2017-08-17 DIAGNOSIS — F39 Unspecified mood [affective] disorder: Secondary | ICD-10-CM | POA: Diagnosis not present

## 2017-08-17 DIAGNOSIS — H524 Presbyopia: Secondary | ICD-10-CM | POA: Diagnosis not present

## 2017-08-24 MED FILL — TESTOSTERONE CYP 200 MG/ML: 200 | 84 days supply | Qty: 6 | Fill #1

## 2017-08-24 MED FILL — lamoTRIgine 100 MG TABS: 100 | 30 days supply | Qty: 45 | Fill #0

## 2017-08-27 DIAGNOSIS — S46012D Strain of muscle(s) and tendon(s) of the rotator cuff of left shoulder, subsequent encounter: Secondary | ICD-10-CM | POA: Diagnosis not present

## 2017-09-03 ENCOUNTER — Ambulatory Visit: Payer: 59 | Attending: Orthopedic Surgery | Admitting: Physical Therapy

## 2017-09-03 ENCOUNTER — Encounter: Payer: Self-pay | Admitting: Physical Therapy

## 2017-09-03 DIAGNOSIS — M25612 Stiffness of left shoulder, not elsewhere classified: Secondary | ICD-10-CM | POA: Diagnosis not present

## 2017-09-03 DIAGNOSIS — M6281 Muscle weakness (generalized): Secondary | ICD-10-CM | POA: Insufficient documentation

## 2017-09-03 DIAGNOSIS — M25512 Pain in left shoulder: Secondary | ICD-10-CM | POA: Diagnosis not present

## 2017-09-03 NOTE — Therapy (Signed)
[facility] [place] [address] [place], [place], [address] Phone: [phone]   Fax:  [phone]  Physical Therapy Evaluation  Patient Details  Name: James Boone MRN: [id number] Date of Birth: August 12, 1960 Referring Provider: Dion Saucier   Encounter Date: 09/03/2017  PT End of Session - 09/03/17 1511    Visit Number  1    Date for PT Re-Evaluation  11/03/17    PT Start Time  1440    PT Stop Time  1515    PT Time Calculation (min)  35 min    Activity Tolerance  Patient tolerated treatment well    Behavior During Therapy  [facility] for tasks assessed/performed       Past Medical History:  Diagnosis Date  . Abnormal liver function test   . Acquired cavovarus deformity of right foot 2016   Dr. Victorino Dike, using boot modification and [place] brace  . ADD (attention deficit disorder)   . ADD (attention deficit disorder)   . Allergic rhinitis   . Arthritis   . Back pain    lower  . Complete rotator cuff tear of left shoulder 07/12/2017  . Depression   . Hemorrhoid   . Hypercholesteremia   . Hyperglycemia   . Hyperlipidemia   . Hypertension   . Hypogonadism male   . Peripheral edema   . Sleep apnea 2005   dx in 2005 , on CPAP setting of 13  . Varicose veins    both legs    Past Surgical History:  Procedure Laterality Date  . HERNIA REPAIR  2001   abdominal hernia  . KNEE ARTHROSCOPY  05/2008   left, Dr.Geoffrey  . KNEE ARTHROSCOPY  2008   right, Dr.Geoffrey  . SHOULDER ARTHROSCOPY WITH ROTATOR CUFF REPAIR AND SUBACROMIAL DECOMPRESSION Left 07/12/2017   Procedure: LEFT SHOULDER ARTHROSCOPY DEBRIDEMENT, ACROMIOPLASTY, ROTATOR CUFF REPAIR;  Surgeon: Teryl Lucy, MD;  Location: [facility];  Service: Orthopedics;  Laterality: Left;  . TOTAL ANKLE REPLACEMENT Right 2016  . TOTAL KNEE ARTHROPLASTY Left 12/30/2012   Procedure: LEFT TOTAL KNEE ARTHROPLASTY;  Surgeon: Loanne Drilling, MD;  Location: [facility];  Service:  Orthopedics;  Laterality: Left;  . TOTAL KNEE ARTHROPLASTY Right 03/16/2014   Procedure: RIGHT TOTAL KNEE ARTHROPLASTY;  Surgeon: Loanne Drilling, MD;  Location: [facility];  Service: Orthopedics;  Laterality: Right;  Marland Kitchen VASECTOMY  1990  . wisdom teeth extractiion      There were no vitals filed for this visit.   Subjective Assessment - 09/03/17 1443    Subjective  Patient reports a fall on the left arm, in December.  Slipped on snow.  MRI showed RC tear.  He underwent left RC repair on 07/12/17.  He was in a sling until last week.  He has not started any exercises    Limitations  Lifting;House hold activities    Patient Stated Goals  have no pain, normal ROM and normal strength    Currently in Pain?  Yes    Pain Score  1     Pain Location  Shoulder    Pain Orientation  Left    Pain Descriptors / Indicators  Sharp    Pain Type  Acute pain;Surgical pain    Pain Onset  More than a month ago    Pain Frequency  Intermittent    Aggravating Factors   pain up to 8/10 reaching out, pulling    Pain Relieving Factors  rest helps, pain can  be 0/10    Effect of Pain on Daily Activities  difficlty with ADL's         [facility] PT Assessment - 09/03/17 0001      Assessment   Medical Diagnosis  s/p left RC repair    Referring Provider  [place]    Onset Date/Surgical Date  07/12/17    Hand Dominance  Right    Prior Therapy  no      Precautions   Precautions  None      Balance Screen   Has the patient fallen in the past 6 months  Yes    How many times?  1    Has the patient had a decrease in activity level because of a fear of falling?   No    Is the patient reluctant to leave their home because of a fear of falling?   No      Home Environment   Additional Comments  noramlly would do housework, yardwork, has a 20 year old grandchild and 68 month old grandchild that he would like to be able to lift      Prior Function   Level of Independence  Independent    Vocation  On disability    Leisure  no  exercise      Posture/Postural Control   Posture Comments  fwd head, rounded shoulders      ROM / Strength   AROM / PROM / Strength  AROM;PROM;Strength      AROM   AROM Assessment Site  Shoulder    Right/Left Shoulder  Left    Left Shoulder Flexion  100 Degrees    Left Shoulder ABduction  60 Degrees    Left Shoulder Internal Rotation  10 Degrees    Left Shoulder External Rotation  20 Degrees      PROM   PROM Assessment Site  Shoulder    Right/Left Shoulder  Left    Left Shoulder Flexion  116 Degrees    Left Shoulder ABduction  110 Degrees    Left Shoulder Internal Rotation  15 Degrees    Left Shoulder External Rotation  30 Degrees      Strength   Overall Strength Comments  not tested due to the surgical protocol      Palpation   Palpation comment  non tender, scars are well healed                Objective measurements completed on examination: See above findings.              PT Education - 09/03/17 1508    Education provided  Yes    Education Details  AAROM/PROM cue to go slow    Person(s) Educated  Patient    Methods  Explanation;Demonstration;Tactile cues;Verbal cues;Handout    Comprehension  Verbalized understanding;Returned demonstration;Verbal cues required;Tactile cues required       PT Short Term Goals - 09/03/17 1518      PT SHORT TERM GOAL #1   Title  independenet with HEP    Time  2    Period  Weeks    Status  New        PT Long Term Goals - 09/03/17 1520      PT LONG TERM GOAL #1   Title  report pain decreased 50% with ADL's    Time  8    Period  Weeks    Status  New      PT LONG TERM GOAL #2  Title  report no difficulty getting dressed or washing hair    Time  8    Period  Weeks    Status  New      PT LONG TERM GOAL #3   Title  increase left shoulder flexion to 140 degrees    Time  8    Period  Weeks    Status  New      PT LONG TERM GOAL #4   Title  increase left shoulder IR to 60 degrees    Time  8     Period  Weeks    Status  New      PT LONG TERM GOAL #5   Title  lift 5# overhead    Time  8    Period  Weeks    Status  New             Plan - 09/03/17 1512    Clinical Impression Statement  Patient reports a fall in December on the snow.  He landed on the left elbow.  He was found to have a left torn RC.  He underwent a repair on 07/12/17.  HE has been in a sling since that time.  He presents with poor ROM, pretty good PROM, stretngth was not tested due to surgical protocol, he is using his left upper trap to compensate, has a lot of spasms here.      Clinical Presentation  Evolving    Clinical Decision Making  Low    Rehab Potential  Good    PT Frequency  2x / week    PT Duration  8 weeks    PT Treatment/Interventions  ADLs/Self Care Home Management;Cryotherapy;Electrical Stimulation;Therapeutic activities;Therapeutic exercise;Patient/family education;Manual techniques;Vasopneumatic Device    PT Next Visit Plan  work on [employer]OM, scapular stabilization    Consulted and Agree with Plan of Care  Patient       Patient will benefit from skilled therapeutic intervention in order to improve the following deficits and impairments:  Decreased range of motion, Increased muscle spasms, Impaired UE functional use, Pain, Postural dysfunction, Decreased strength  Visit Diagnosis: Acute pain of left shoulder - Plan: PT plan of care cert/re-cert  Stiffness of left shoulder, not elsewhere classified - Plan: PT plan of care cert/re-cert  Muscle weakness (generalized) - Plan: PT plan of care cert/re-cert     Problem List Patient Active Problem List   Diagnosis Date Noted  . Complete rotator cuff tear of left shoulder 07/12/2017  . Shoulder injury, left, initial encounter 06/01/2017  . PCP NOTES >>>>>>>>>>>>>>>>>>>>>>>> 10/04/2016  . Edema-- R>>L 08/29/2013  . Hyponatremia 12/31/2012  . DJD (degenerative joint disease) 10/18/2011  . Annual physical exam 04/17/2011  . Hyperglycemia   .  Hyperlipidemia 08/28/2007  . Hypogonadism in male 01/02/2007  . LIVER FUNCTION TESTS, ABNORMAL 01/02/2007  . Depression  10/31/2006  . Attention deficit disorder--Dr. Nolen MuMcKinney 10/31/2006  . Essential hypertension 10/31/2006  . ALLERGIC RHINITIS 10/31/2006  . SLEEP APNEA 10/31/2006    Jearld LeschALBRIGHT,Cobie Leidner W., PT 09/03/2017, 3:25 PM  [facility] [address] [place], [place], [address] Phone: [phone]   Fax:  [phone]  Name: James Boone MRN: [id number] Date of Birth: 08/26/1960

## 2017-09-10 ENCOUNTER — Encounter: Payer: Self-pay | Admitting: Physical Therapy

## 2017-09-10 ENCOUNTER — Ambulatory Visit: Payer: 59 | Admitting: Physical Therapy

## 2017-09-10 DIAGNOSIS — M6281 Muscle weakness (generalized): Secondary | ICD-10-CM

## 2017-09-10 DIAGNOSIS — M25612 Stiffness of left shoulder, not elsewhere classified: Secondary | ICD-10-CM

## 2017-09-10 DIAGNOSIS — M25512 Pain in left shoulder: Secondary | ICD-10-CM | POA: Diagnosis not present

## 2017-09-10 NOTE — Therapy (Signed)
[facility] [address] [place], [place], [address] Phone: [phone]   Fax:  [phone]  Physical Therapy Treatment  Patient Details  Name: James Boone MRN: [id number] Date of Birth: 18-Jul-1960 Referring Provider: Dion Saucier   Encounter Date: 09/10/2017  PT End of Session - 09/10/17 0940    Visit Number  2    Date for PT Re-Evaluation  11/03/17    PT Start Time  0849    PT Stop Time  0942    PT Time Calculation (min)  53 min    Activity Tolerance  Patient tolerated treatment well    Behavior During Therapy  [facility] for tasks assessed/performed       Past Medical History:  Diagnosis Date  . Abnormal liver function test   . Acquired cavovarus deformity of right foot 2016   Dr. Victorino Dike, using boot modification and [place] brace  . ADD (attention deficit disorder)   . ADD (attention deficit disorder)   . Allergic rhinitis   . Arthritis   . Back pain    lower  . Complete rotator cuff tear of left shoulder 07/12/2017  . Depression   . Hemorrhoid   . Hypercholesteremia   . Hyperglycemia   . Hyperlipidemia   . Hypertension   . Hypogonadism male   . Peripheral edema   . Sleep apnea 2005   dx in 2005 , on CPAP setting of 13  . Varicose veins    both legs    Past Surgical History:  Procedure Laterality Date  . HERNIA REPAIR  2001   abdominal hernia  . KNEE ARTHROSCOPY  05/2008   left, Dr.Geoffrey  . KNEE ARTHROSCOPY  2008   right, Dr.Geoffrey  . SHOULDER ARTHROSCOPY WITH ROTATOR CUFF REPAIR AND SUBACROMIAL DECOMPRESSION Left 07/12/2017   Procedure: LEFT SHOULDER ARTHROSCOPY DEBRIDEMENT, ACROMIOPLASTY, ROTATOR CUFF REPAIR;  Surgeon: Teryl Lucy, MD;  Location: [facility];  Service: Orthopedics;  Laterality: Left;  . TOTAL ANKLE REPLACEMENT Right 2016  . TOTAL KNEE ARTHROPLASTY Left 12/30/2012   Procedure: LEFT TOTAL KNEE ARTHROPLASTY;  Surgeon: Loanne Drilling, MD;  [facility];  Service:  Orthopedics;  Laterality: Left;  . TOTAL KNEE ARTHROPLASTY Right 03/16/2014   Procedure: RIGHT TOTAL KNEE ARTHROPLASTY;  Surgeon: Loanne Drilling, MD;  Location: [facility];  Service: Orthopedics;  Laterality: Right;  Marland Kitchen VASECTOMY  1990  . wisdom teeth extractiion      There were no vitals filed for this visit.  Subjective Assessment - 09/10/17 0850    Subjective  Pt reports that he is doing well but can't move his arm behind him.    Currently in Pain?  Yes    Pain Score  2     Pain Location  Shoulder    Pain Orientation  Left    Pain Onset  More than a month ago                       [facility] Adult PT Treatment/Exercise - 09/10/17 0001      Exercises   Exercises  Shoulder      Shoulder Exercises: Seated   Extension  AAROM;Both;20 reps;Strengthening vc needed to depress scapula     Theraband Level (Shoulder Extension)  --    Row  --    Theraband Level (Shoulder Row)  --    External Rotation  AAROM;Both;20 reps vc for proper technique    Internal Rotation  AAROM;Both;20  reps    Flexion  AAROM;Both;20 reps      Shoulder Exercises: Standing   Theraband Level (Shoulder External Rotation)  Level 2 (Red)    External Rotation Limitations  1x10 vc needed to keep elbow at 90 degrees    Extension  AAROM;AROM;Strengthening;Both;20 reps;Theraband    Theraband Level (Shoulder Extension)  Level 2 (Red)    Extension Limitations  2x10 pt needed vc to depress scapula    Row  AROM;Strengthening;Both;20 reps    Theraband Level (Shoulder Row)  Level 2 (Red)    Row Limitations  pt needed cuing to depress scapula      Shoulder Exercises: ROM/Strengthening   UBE (Upper Arm Bike)  L1 6 min      Manual Therapy   Manual Therapy  Passive ROM    Manual therapy comments  pt was very tense and guarded during PROM    Passive ROM  L shoulder all motions               PT Short Term Goals - 09/03/17 1518      PT SHORT TERM GOAL #1   Title  independenet with HEP    Time  2     Period  Weeks    Status  New        PT Long Term Goals - 09/03/17 1520      PT LONG TERM GOAL #1   Title  report pain decreased 50% with ADL's    Time  8    Period  Weeks    Status  New      PT LONG TERM GOAL #2   Title  report no difficulty getting dressed or washing hair    Time  8    Period  Weeks    Status  New      PT LONG TERM GOAL #3   Title  increase left shoulder flexion to 140 degrees    Time  8    Period  Weeks    Status  New      PT LONG TERM GOAL #4   Title  increase left shoulder IR to 60 degrees    Time  8    Period  Weeks    Status  New      PT LONG TERM GOAL #5   Title  lift 5# overhead    Time  8    Period  Weeks    Status  New            Plan - 09/10/17 0941    Clinical Impression Statement  Pt requires verbal and tactile cuing to help depress and retract scapula during theraband exercises.  Pt exhibited shaking and weakness during external rotation. Pt was very tense during PROM and is eager to return to work where he will have to lift heavy objects overhead.     Clinical Presentation  Evolving    Clinical Decision Making  Low    Rehab Potential  Good    PT Frequency  2x / week    PT Duration  8 weeks    PT Treatment/Interventions  ADLs/Self Care Home Management;Cryotherapy;Electrical Stimulation;Therapeutic activities;Therapeutic exercise;Patient/family education;Manual techniques;Vasopneumatic Device    PT Next Visit Plan  continue to work on AAROM, scapular stabilization and strengthening     Consulted and Agree with Plan of Care  Patient       Patient will benefit from skilled therapeutic intervention in order to improve the following deficits and impairments:  Decreased range of motion, Increased muscle spasms, Impaired UE functional use, Pain, Postural dysfunction, Decreased strength  Visit Diagnosis: Acute pain of left shoulder  Stiffness of left shoulder, not elsewhere classified  Muscle weakness  (generalized)     Problem List Patient Active Problem List   Diagnosis Date Noted  . Complete rotator cuff tear of left shoulder 07/12/2017  . Shoulder injury, left, initial encounter 06/01/2017  . PCP NOTES >>>>>>>>>>>>>>>>>>>>>>>> 10/04/2016  . Edema-- R>>L 08/29/2013  . Hyponatremia 12/31/2012  . DJD (degenerative joint disease) 10/18/2011  . Annual physical exam 04/17/2011  . Hyperglycemia   . Hyperlipidemia 08/28/2007  . Hypogonadism in male 01/02/2007  . LIVER FUNCTION TESTS, ABNORMAL 01/02/2007  . Depression  10/31/2006  . Attention deficit disorder--Dr. Nolen Mu 10/31/2006  . Essential hypertension 10/31/2006  . ALLERGIC RHINITIS 10/31/2006  . SLEEP APNEA 10/31/2006    Lowanda Foster 09/10/2017, 9:55 AM  [facility] [address] [place], [place], [address] Phone: [phone]   Fax:  [phone]  Name: LAZARO ISENHOWER MRN: [id number] Date of Birth: 1960/06/21

## 2017-09-13 ENCOUNTER — Ambulatory Visit: Payer: 59 | Admitting: Physical Therapy

## 2017-09-13 ENCOUNTER — Encounter: Payer: Self-pay | Admitting: Physical Therapy

## 2017-09-13 DIAGNOSIS — M6281 Muscle weakness (generalized): Secondary | ICD-10-CM | POA: Diagnosis not present

## 2017-09-13 DIAGNOSIS — M25512 Pain in left shoulder: Secondary | ICD-10-CM | POA: Diagnosis not present

## 2017-09-13 DIAGNOSIS — M25612 Stiffness of left shoulder, not elsewhere classified: Secondary | ICD-10-CM | POA: Diagnosis not present

## 2017-09-13 NOTE — Therapy (Signed)
[facility] [address] [place], [place], [address] Phone: [phone]   Fax:  [phone]  Physical Therapy Treatment  Patient Details  Name: James Boone MRN: [id number] Date of Birth: 09/07/1960 Referring Provider: Dion Saucier   Encounter Date: 09/13/2017  PT End of Session - 09/13/17 0925    Visit Number  3    Date for PT Re-Evaluation  11/03/17    PT Start Time  0845    PT Stop Time  0931    PT Time Calculation (min)  46 min    Activity Tolerance  Patient tolerated treatment well    Behavior During Therapy  [facility] for tasks assessed/performed       Past Medical History:  Diagnosis Date  . Abnormal liver function test   . Acquired cavovarus deformity of right foot 2016   Dr. Victorino Dike, using boot modification and [place] brace  . ADD (attention deficit disorder)   . ADD (attention deficit disorder)   . Allergic rhinitis   . Arthritis   . Back pain    lower  . Complete rotator cuff tear of left shoulder 07/12/2017  . Depression   . Hemorrhoid   . Hypercholesteremia   . Hyperglycemia   . Hyperlipidemia   . Hypertension   . Hypogonadism male   . Peripheral edema   . Sleep apnea 2005   dx in 2005 , on CPAP setting of 13  . Varicose veins    both legs    Past Surgical History:  Procedure Laterality Date  . HERNIA REPAIR  2001   abdominal hernia  . KNEE ARTHROSCOPY  05/2008   left, Dr.Geoffrey  . KNEE ARTHROSCOPY  2008   right, Dr.Geoffrey  . SHOULDER ARTHROSCOPY WITH ROTATOR CUFF REPAIR AND SUBACROMIAL DECOMPRESSION Left 07/12/2017   Procedure: LEFT SHOULDER ARTHROSCOPY DEBRIDEMENT, ACROMIOPLASTY, ROTATOR CUFF REPAIR;  Surgeon: Teryl Lucy, MD;  Location: [facility];  Service: Orthopedics;  Laterality: Left;  . TOTAL ANKLE REPLACEMENT Right 2016  . TOTAL KNEE ARTHROPLASTY Left 12/30/2012   Procedure: LEFT TOTAL KNEE ARTHROPLASTY;  Surgeon: Loanne Drilling, MD;  [facility];  Service:  Orthopedics;  Laterality: Left;  . TOTAL KNEE ARTHROPLASTY Right 03/16/2014   Procedure: RIGHT TOTAL KNEE ARTHROPLASTY;  Surgeon: Loanne Drilling, MD;  [facility];  Service: Orthopedics;  Laterality: Right;  Marland Kitchen VASECTOMY  1990  . wisdom teeth extractiion      There were no vitals filed for this visit.  Subjective Assessment - 09/13/17 0845    Subjective  Pt reports that his shoulder is sore today from using a stick shift car yesterday.    Currently in Pain?  Yes    Pain Score  2     Pain Location  Shoulder    Pain Orientation  Left                       [facility] Adult PT Treatment/Exercise - 09/13/17 0001      Shoulder Exercises: Standing   External Rotation  AAROM;Both;20 reps    Theraband Level (Shoulder External Rotation)  Level 1 (Yellow) 2x10 vc needed for scapular depression    Internal Rotation  AAROM;Both;20 reps    Flexion  AAROM;Both;20 reps    ABduction  AAROM;Both;20 reps    Extension  AAROM;Both;20 reps    Row  AROM;Both;20 reps;Theraband    Theraband Level (Shoulder Row)  Level 1 (Yellow)    Other  Standing Exercises  finger ladder flexion and abduction 5x vc for scapular depression    Other Standing Exercises  yellow TB Extensions 2x10,Rows,ER,,IR vc for scapular depression and posture      Shoulder Exercises: ROM/Strengthening   UBE (Upper Arm Bike)  L1      Modalities   Modalities  Cryotherapy      Cryotherapy   Number Minutes Cryotherapy  10 Minutes    Cryotherapy Location  Shoulder    Type of Cryotherapy  Ice pack      Manual Therapy   Manual Therapy  Passive ROM    Manual therapy comments  pt was very tense and guarded during PROM    Passive ROM  L shoulder flexion, ER 10x reps each               PT Short Term Goals - 09/13/17 0929      PT SHORT TERM GOAL #1   Title  independenet with HEP    Status  Achieved        PT Long Term Goals - 09/03/17 1520      PT LONG TERM GOAL #1   Title  report pain decreased  50% with ADL's    Time  8    Period  Weeks    Status  New      PT LONG TERM GOAL #2   Title  report no difficulty getting dressed or washing hair    Time  8    Period  Weeks    Status  New      PT LONG TERM GOAL #3   Title  increase left shoulder flexion to 140 degrees    Time  8    Period  Weeks    Status  New      PT LONG TERM GOAL #4   Title  increase left shoulder IR to 60 degrees    Time  8    Period  Weeks    Status  New      PT LONG TERM GOAL #5   Title  lift 5# overhead    Time  8    Period  Weeks    Status  New            Plan - 09/13/17 1308    Clinical Impression Statement  Pt c/o of numbness in his arm while doing finger ladders in abduction and reported that he has been doing his HEP. He needed verbal cuing to depress scapula and maintain upright posture duirng therapeutic band exercises.     Rehab Potential  Good    PT Frequency  2x / week    PT Duration  8 weeks    PT Treatment/Interventions  ADLs/Self Care Home Management;Cryotherapy;Electrical Stimulation;Therapeutic activities;Therapeutic exercise;Patient/family education;Manual techniques;Vasopneumatic Device    PT Next Visit Plan  continue to work on AAROM, scapular stabilization and strengthening        Patient will benefit from skilled therapeutic intervention in order to improve the following deficits and impairments:     Visit Diagnosis: Acute pain of left shoulder  Stiffness of left shoulder, not elsewhere classified  Muscle weakness (generalized)     Problem List Patient Active Problem List   Diagnosis Date Noted  . Complete rotator cuff tear of left shoulder 07/12/2017  . Shoulder injury, left, initial encounter 06/01/2017  . PCP NOTES >>>>>>>>>>>>>>>>>>>>>>>> 10/04/2016  . Edema-- R>>L 08/29/2013  . Hyponatremia 12/31/2012  . DJD (degenerative joint disease) 10/18/2011  .  Annual physical exam 04/17/2011  . Hyperglycemia   . Hyperlipidemia 08/28/2007  . Hypogonadism in  male 01/02/2007  . LIVER FUNCTION TESTS, ABNORMAL 01/02/2007  . Depression  10/31/2006  . Attention deficit disorder--Dr. Nolen MuMcKinney 10/31/2006  . Essential hypertension 10/31/2006  . ALLERGIC RHINITIS 10/31/2006  . SLEEP APNEA 10/31/2006    Lowanda FosterKatherine Alberto Schoch,SPTA 09/13/2017, 9:31 AM  [facility] [address] [place], [place], [address] Phone: [phone]   Fax:  [phone]  Name: Rush Landmarkan C Goslin MRN: [id number] Date of Birth: 06/07/1960

## 2017-09-17 ENCOUNTER — Encounter: Payer: Self-pay | Admitting: Physical Therapy

## 2017-09-17 ENCOUNTER — Ambulatory Visit: Payer: 59 | Admitting: Physical Therapy

## 2017-09-17 DIAGNOSIS — M25612 Stiffness of left shoulder, not elsewhere classified: Secondary | ICD-10-CM | POA: Diagnosis not present

## 2017-09-17 DIAGNOSIS — M25512 Pain in left shoulder: Secondary | ICD-10-CM | POA: Diagnosis not present

## 2017-09-17 DIAGNOSIS — M6281 Muscle weakness (generalized): Secondary | ICD-10-CM

## 2017-09-17 MED FILL — buPROPion HCL ER (XL) 150 M: 150 | 30 days supply | Qty: 30 | Fill #1

## 2017-09-17 MED FILL — SERTRALINE HCL 100 MG TAB: 100 | 30 days supply | Qty: 30 | Fill #0

## 2017-09-17 NOTE — Therapy (Signed)
[facility] [address] [place], [place], [address] Phone: [phone]   Fax:  [phone]  Physical Therapy Treatment  Patient Details  Name: James Boone MRN: [id number] Date of Birth: [birth year] Referring Provider: Dion Saucier   Encounter Date: 09/17/2017  PT End of Session - 09/17/17 1555    Visit Number  4    Date for PT Re-Evaluation  11/03/17    PT Start Time  1520    PT Stop Time  1603    PT Time Calculation (min)  43 min    Activity Tolerance  Patient tolerated treatment well    Behavior During Therapy  [facility] for tasks assessed/performed       Past Medical History:  Diagnosis Date  . Abnormal liver function test   . Acquired cavovarus deformity of right foot 2016   Dr. Victorino Dike, using boot modification and [place] brace  . ADD (attention deficit disorder)   . ADD (attention deficit disorder)   . Allergic rhinitis   . Arthritis   . Back pain    lower  . Complete rotator cuff tear of left shoulder 07/12/2017  . Depression   . Hemorrhoid   . Hypercholesteremia   . Hyperglycemia   . Hyperlipidemia   . Hypertension   . Hypogonadism male   . Peripheral edema   . Sleep apnea 2005   dx in 2005 , on CPAP setting of 13  . Varicose veins    both legs    Past Surgical History:  Procedure Laterality Date  . HERNIA REPAIR  2001   abdominal hernia  . KNEE ARTHROSCOPY  05/2008   left, Dr.Geoffrey  . KNEE ARTHROSCOPY  2008   right, Dr.Geoffrey  . SHOULDER ARTHROSCOPY WITH ROTATOR CUFF REPAIR AND SUBACROMIAL DECOMPRESSION Left 07/12/2017   Procedure: LEFT SHOULDER ARTHROSCOPY DEBRIDEMENT, ACROMIOPLASTY, ROTATOR CUFF REPAIR;  Surgeon: Teryl Lucy, MD;  Location: [facility];  Service: Orthopedics;  Laterality: Left;  . TOTAL ANKLE REPLACEMENT Right 2016  . TOTAL KNEE ARTHROPLASTY Left 12/30/2012   Procedure: LEFT TOTAL KNEE ARTHROPLASTY;  Surgeon: Loanne Drilling, MD;  Location: [facility];  Service:  Orthopedics;  Laterality: Left;  . TOTAL KNEE ARTHROPLASTY Right 03/16/2014   Procedure: RIGHT TOTAL KNEE ARTHROPLASTY;  Surgeon: Loanne Drilling, MD;  Location: [facility];  Service: Orthopedics;  Laterality: Right;  Marland Kitchen VASECTOMY  1990  . wisdom teeth extractiion      There were no vitals filed for this visit.  Subjective Assessment - 09/17/17 1521    Subjective  pt reports that he is doing well today.    Currently in Pain?  No/denies    Pain Score  0-No pain                       [facility] Adult PT Treatment/Exercise - 09/17/17 0001      Shoulder Exercises: Standing   External Rotation  AAROM;Left;20 reps    Internal Rotation  AAROM;Left;20 reps    Flexion  AAROM;Both;20 reps    ABduction  AAROM;Both;20 reps    Extension  AAROM;Both;20 reps    Row  AAROM;Strengthening;Both;20 reps;Theraband    Theraband Level (Shoulder Row)  Level 2 (Red)    Other Standing Exercises  towel IR stretch 3x10, 3lb shoulder rows,  10x ball wall clocks vc to depress scapula for wall clocks    Other Standing Exercises  Red TB 2x10 Shoulder extension,IR,and ER vc and tactile cuing  to depress scapula       Shoulder Exercises: ROM/Strengthening   UBE (Upper Arm Bike)  L2 6 min reverse      Cryotherapy   Number Minutes Cryotherapy  10 Minutes    Cryotherapy Location  Shoulder    Type of Cryotherapy  Ice pack      Manual Therapy   Manual Therapy  Passive ROM    Passive ROM  L shoulder flexion, ER, abduction               PT Short Term Goals - 09/13/17 0929      PT SHORT TERM GOAL #1   Title  independenet with HEP    Status  Achieved        PT Long Term Goals - 09/03/17 1520      PT LONG TERM GOAL #1   Title  report pain decreased 50% with ADL's    Time  8    Period  Weeks    Status  New      PT LONG TERM GOAL #2   Title  report no difficulty getting dressed or washing hair    Time  8    Period  Weeks    Status  New      PT LONG TERM GOAL #3   Title  increase left  shoulder flexion to 140 degrees    Time  8    Period  Weeks    Status  New      PT LONG TERM GOAL #4   Title  increase left shoulder IR to 60 degrees    Time  8    Period  Weeks    Status  New      PT LONG TERM GOAL #5   Title  lift 5# overhead    Time  8    Period  Weeks    Status  New            Plan - 09/17/17 1556    Clinical Impression Statement  Pt is continues to be limited with AROM in left shoulder  and elevates his scapula during shoulder flexion. Pt required frequent c and tactile cuing to depress and retract scapula during TB exercises and wall clocks with the ball.     Rehab Potential  Good    PT Frequency  2x / week    PT Duration  8 weeks    PT Treatment/Interventions  ADLs/Self Care Home Management;Cryotherapy;Electrical Stimulation;Therapeutic activities;Therapeutic exercise;Patient/family education;Manual techniques;Vasopneumatic Device    PT Next Visit Plan  continue to work on AROM, scapular stabilization and shoulder strengthening     Consulted and Agree with Plan of Care  Patient       Patient will benefit from skilled therapeutic intervention in order to improve the following deficits and impairments:  Decreased range of motion, Increased muscle spasms, Impaired UE functional use, Pain, Postural dysfunction, Decreased strength  Visit Diagnosis: Acute pain of left shoulder  Stiffness of left shoulder, not elsewhere classified  Muscle weakness (generalized)     Problem List Patient Active Problem List   Diagnosis Date Noted  . Complete rotator cuff tear of left shoulder 07/12/2017  . Shoulder injury, left, initial encounter 06/01/2017  . PCP NOTES >>>>>>>>>>>>>>>>>>>>>>>> 10/04/2016  . Edema-- R>>L 08/29/2013  . Hyponatremia 12/31/2012  . DJD (degenerative joint disease) 10/18/2011  . Annual physical exam 04/17/2011  . Hyperglycemia   . Hyperlipidemia 08/28/2007  . Hypogonadism in male 01/02/2007  . LIVER FUNCTION  TESTS, ABNORMAL  01/02/2007  . Depression  10/31/2006  . Attention deficit disorder--Dr. Nolen Mu 10/31/2006  . Essential hypertension 10/31/2006  . ALLERGIC RHINITIS 10/31/2006  . SLEEP APNEA 10/31/2006    Lowanda Foster 09/17/2017, 4:09 PM  [facility] [place] [address] [place], [place], [address] Phone: [phone]   Fax:  [phone]  Name: James Boone MRN: [id number] Date of Birth: 03/02/1961

## 2017-09-18 MED FILL — ADDERALL XR 20 MG CAP SA: 20 | 30 days supply | Qty: 60 | Fill #0

## 2017-09-20 ENCOUNTER — Ambulatory Visit: Payer: 59 | Attending: Orthopedic Surgery | Admitting: Physical Therapy

## 2017-09-20 DIAGNOSIS — M25512 Pain in left shoulder: Secondary | ICD-10-CM | POA: Insufficient documentation

## 2017-09-20 DIAGNOSIS — M25612 Stiffness of left shoulder, not elsewhere classified: Secondary | ICD-10-CM | POA: Diagnosis not present

## 2017-09-20 DIAGNOSIS — M6281 Muscle weakness (generalized): Secondary | ICD-10-CM | POA: Diagnosis not present

## 2017-09-20 NOTE — Therapy (Signed)
[facility] [address] [place], [place], [address] Phone: [phone]   Fax:  [phone]  Physical Therapy Treatment  Patient Details  Name: James Boone MRN: [id number] Date of Birth: 1961/02/24 Referring Provider: Dion Saucier   Encounter Date: 09/20/2017  PT End of Session - 09/20/17 1345    Visit Number  5    Date for PT Re-Evaluation  11/03/17    PT Start Time  1346    PT Stop Time  1441    PT Time Calculation (min)  55 min    Activity Tolerance  Patient tolerated treatment well    Behavior During Therapy  [facility] for tasks assessed/performed       Past Medical History:  Diagnosis Date  . Abnormal liver function test   . Acquired cavovarus deformity of right foot 2016   Dr. Victorino Dike, using boot modification and [place] brace  . ADD (attention deficit disorder)   . ADD (attention deficit disorder)   . Allergic rhinitis   . Arthritis   . Back pain    lower  . Complete rotator cuff tear of left shoulder 07/12/2017  . Depression   . Hemorrhoid   . Hypercholesteremia   . Hyperglycemia   . Hyperlipidemia   . Hypertension   . Hypogonadism male   . Peripheral edema   . Sleep apnea 2005   dx in 2005 , on CPAP setting of 13  . Varicose veins    both legs    Past Surgical History:  Procedure Laterality Date  . HERNIA REPAIR  2001   abdominal hernia  . KNEE ARTHROSCOPY  05/2008   left, Dr.Geoffrey  . KNEE ARTHROSCOPY  2008   right, Dr.Geoffrey  . SHOULDER ARTHROSCOPY WITH ROTATOR CUFF REPAIR AND SUBACROMIAL DECOMPRESSION Left 07/12/2017   Procedure: LEFT SHOULDER ARTHROSCOPY DEBRIDEMENT, ACROMIOPLASTY, ROTATOR CUFF REPAIR;  Surgeon: Teryl Lucy, MD;  Location: [facility];  Service: Orthopedics;  Laterality: Left;  . TOTAL ANKLE REPLACEMENT Right 2016  . TOTAL KNEE ARTHROPLASTY Left 12/30/2012   Procedure: LEFT TOTAL KNEE ARTHROPLASTY;  Surgeon: Loanne Drilling, MD;  Location: [facility];  Service:  Orthopedics;  Laterality: Left;  . TOTAL KNEE ARTHROPLASTY Right 03/16/2014   Procedure: RIGHT TOTAL KNEE ARTHROPLASTY;  Surgeon: Loanne Drilling, MD;  Location: [facility];  Service: Orthopedics;  Laterality: Right;  Marland Kitchen VASECTOMY  1990  . wisdom teeth extractiion      There were no vitals filed for this visit.  Subjective Assessment - 09/20/17 1348    Subjective  Pt has no new c/o    Patient Stated Goals  have no pain, normal ROM and normal strength    Currently in Pain?  No/denies                       [facility] Adult PT Treatment/Exercise - 09/20/17 0001      Shoulder Exercises: Prone   Extension  Strengthening;Left;20 reps    Horizontal ABduction 1  Strengthening;Left;20 reps    Other Prone Exercises  row x 20      Shoulder Exercises: Sidelying   External Rotation  Strengthening;Left;20 reps;Weights    External Rotation Weight (lbs)  1    Flexion  Strengthening;20 reps;Left      Shoulder Exercises: Standing   External Rotation  Strengthening;Both;20 reps Robber    Internal Rotation  Strengthening;Both;20 reps    Flexion  Limitations    Flexion Limitations  pt  unable to do without copensation    Extension  Strengthening;Both;20 reps    Row  Strengthening;Both;20 reps;Theraband    Theraband Level (Shoulder Row)  Level 2 (Red)      Shoulder Exercises: ROM/Strengthening   UBE (Upper Arm Bike)  L2 6 min reverse    Wall Wash  7, 9, 11, and 12 o'clock with constant pressure; VCs for depressing shoulder      Modalities   Modalities  Cryotherapy      Cryotherapy   Number Minutes Cryotherapy  10 Minutes    Cryotherapy Location  Shoulder    Type of Cryotherapy  Ice pack      Manual Therapy   Manual Therapy  Passive ROM    Passive ROM  L shoulder flexion, ER, abduction             PT Education - 09/20/17 1536    Education provided  Yes    Education Details  HEP    Person(s) Educated  Patient    Methods  Explanation;Demonstration    Comprehension   Verbalized understanding;Returned demonstration       PT Short Term Goals - 09/13/17 0929      PT SHORT TERM GOAL #1   Title  independenet with HEP    Status  Achieved        PT Long Term Goals - 09/03/17 1520      PT LONG TERM GOAL #1   Title  report pain decreased 50% with ADL's    Time  8    Period  Weeks    Status  New      PT LONG TERM GOAL #2   Title  report no difficulty getting dressed or washing hair    Time  8    Period  Weeks    Status  New      PT LONG TERM GOAL #3   Title  increase left shoulder flexion to 140 degrees    Time  8    Period  Weeks    Status  New      PT LONG TERM GOAL #4   Title  increase left shoulder IR to 60 degrees    Time  8    Period  Weeks    Status  New      PT LONG TERM GOAL #5   Title  lift 5# overhead    Time  8    Period  Weeks    Status  New            Plan - 09/20/17 1455    Clinical Impression Statement  Patient continues to compensate with upper traps for flexion. He did well with SDLY flexion showing proper form.     PT Treatment/Interventions  ADLs/Self Care Home Management;Cryotherapy;Electrical Stimulation;Therapeutic activities;Therapeutic exercise;Patient/family education;Manual techniques;Vasopneumatic Device    PT Next Visit Plan  continue to work on AROM, scapular stabilization and shoulder strengthening     PT Home Exercise Plan  SDLY flexion and ER, prone T       Patient will benefit from skilled therapeutic intervention in order to improve the following deficits and impairments:  Decreased range of motion, Increased muscle spasms, Impaired UE functional use, Pain, Postural dysfunction, Decreased strength  Visit Diagnosis: Stiffness of left shoulder, not elsewhere classified  Muscle weakness (generalized)     Problem List Patient Active Problem List   Diagnosis Date Noted  . Complete rotator cuff tear of left shoulder 07/12/2017  . Shoulder injury,  left, initial encounter 06/01/2017  .  PCP NOTES >>>>>>>>>>>>>>>>>>>>>>>> 10/04/2016  . Edema-- R>>L 08/29/2013  . Hyponatremia 12/31/2012  . DJD (degenerative joint disease) 10/18/2011  . Annual physical exam 04/17/2011  . Hyperglycemia   . Hyperlipidemia 08/28/2007  . Hypogonadism in male 01/02/2007  . LIVER FUNCTION TESTS, ABNORMAL 01/02/2007  . Depression  10/31/2006  . Attention deficit disorder--Dr. Nolen Mu 10/31/2006  . Essential hypertension 10/31/2006  . ALLERGIC RHINITIS 10/31/2006  . SLEEP APNEA 10/31/2006    Aarna Mihalko PT 09/20/2017, 3:38 PM  [facility] [address] [place], [place], [address] Phone: [phone]   Fax:  [phone]  Name: KAMIR SELOVER MRN: [id number] Date of Birth: November 06, 1960

## 2017-09-24 DIAGNOSIS — S46012D Strain of muscle(s) and tendon(s) of the rotator cuff of left shoulder, subsequent encounter: Secondary | ICD-10-CM | POA: Diagnosis not present

## 2017-09-26 ENCOUNTER — Encounter: Payer: Self-pay | Admitting: Physical Therapy

## 2017-09-26 ENCOUNTER — Ambulatory Visit: Payer: 59 | Admitting: Physical Therapy

## 2017-09-26 DIAGNOSIS — M6281 Muscle weakness (generalized): Secondary | ICD-10-CM | POA: Diagnosis not present

## 2017-09-26 DIAGNOSIS — M25612 Stiffness of left shoulder, not elsewhere classified: Secondary | ICD-10-CM | POA: Diagnosis not present

## 2017-09-26 DIAGNOSIS — M25512 Pain in left shoulder: Secondary | ICD-10-CM | POA: Diagnosis not present

## 2017-09-26 NOTE — Therapy (Signed)
[facility] [address] [place], [place], [address] Phone: [phone]   Fax:  [phone]  Physical Therapy Treatment  Patient Details  Name: James Boone MRN: [id number] Date of Birth: 1960/08/01 Referring Provider: Dion Saucier   Encounter Date: 09/26/2017  PT End of Session - 09/26/17 1059    Visit Number  6    Date for PT Re-Evaluation  11/03/17    PT Start Time  1015    PT Stop Time  1105    PT Time Calculation (min)  50 min    Activity Tolerance  Patient tolerated treatment well    Behavior During Therapy  [facility] for tasks assessed/performed       Past Medical History:  Diagnosis Date  . Abnormal liver function test   . Acquired cavovarus deformity of right foot 2016   Dr. Victorino Dike, using boot modification and [place] brace  . ADD (attention deficit disorder)   . ADD (attention deficit disorder)   . Allergic rhinitis   . Arthritis   . Back pain    lower  . Complete rotator cuff tear of left shoulder 07/12/2017  . Depression   . Hemorrhoid   . Hypercholesteremia   . Hyperglycemia   . Hyperlipidemia   . Hypertension   . Hypogonadism male   . Peripheral edema   . Sleep apnea 2005   dx in 2005 , on CPAP setting of 13  . Varicose veins    both legs    Past Surgical History:  Procedure Laterality Date  . HERNIA REPAIR  2001   abdominal hernia  . KNEE ARTHROSCOPY  05/2008   left, Dr.Geoffrey  . KNEE ARTHROSCOPY  2008   right, Dr.Geoffrey  . SHOULDER ARTHROSCOPY WITH ROTATOR CUFF REPAIR AND SUBACROMIAL DECOMPRESSION Left 07/12/2017   Procedure: LEFT SHOULDER ARTHROSCOPY DEBRIDEMENT, ACROMIOPLASTY, ROTATOR CUFF REPAIR;  Surgeon: Teryl Lucy, MD;  Location: [facility];  Service: Orthopedics;  Laterality: Left;  . TOTAL ANKLE REPLACEMENT Right 2016  . TOTAL KNEE ARTHROPLASTY Left 12/30/2012   Procedure: LEFT TOTAL KNEE ARTHROPLASTY;  Surgeon: Loanne Drilling, MD;  Location: [facility];  Service:  Orthopedics;  Laterality: Left;  . TOTAL KNEE ARTHROPLASTY Right 03/16/2014   Procedure: RIGHT TOTAL KNEE ARTHROPLASTY;  Surgeon: Loanne Drilling, MD;  Location: [facility];  Service: Orthopedics;  Laterality: Right;  Marland Kitchen VASECTOMY  1990  . wisdom teeth extractiion      There were no vitals filed for this visit.  Subjective Assessment - 09/26/17 1018    Subjective  Pt reports that his shoulder had been acting up lately but not sure why    Currently in Pain?  No/denies    Pain Score  0-No pain         [facility] PT Assessment - 09/26/17 0001      AROM   AROM Assessment Site  Shoulder    Right/Left Shoulder  Left    Left Shoulder Flexion  131 Degrees    Left Shoulder ABduction  110 Degrees                   [facility] Adult PT Treatment/Exercise - 09/26/17 0001      Shoulder Exercises: Supine   Other Supine Exercises  2lb circles flex to 90 2x15 each way       Shoulder Exercises: Seated   Other Seated Exercises  rows & lats 20lb 2x10       Shoulder Exercises: Standing  External Rotation  Both;15 reps;Theraband x2    Theraband Level (Shoulder External Rotation)  Level 2 (Red)    Internal Rotation  Strengthening;Left;15 reps;Theraband x2    Theraband Level (Shoulder Internal Rotation)  Level 2 (Red)    Flexion  Weights;20 reps;Both    Shoulder Flexion Weight (lbs)  1    ABduction  AROM;20 reps;Both    Extension  Strengthening;Both;20 reps    Theraband Level (Shoulder Extension)  Level 2 (Red)    Row  Strengthening;Both;20 reps;Theraband ER at end    Theraband Level (Shoulder Row)  Level 2 (Red)      Shoulder Exercises: ROM/Strengthening   UBE (Upper Arm Bike)  L2 6 min 3rev/9fwd      Modalities   Modalities  Cryotherapy      Cryotherapy   Number Minutes Cryotherapy  10 Minutes    Cryotherapy Location  Shoulder    Type of Cryotherapy  Ice pack      Manual Therapy   Manual Therapy  Passive ROM    Passive ROM  L shoulder flexion, ER, abduction                PT Short Term Goals - 09/13/17 0929      PT SHORT TERM GOAL #1   Title  independenet with HEP    Status  Achieved        PT Long Term Goals - 09/03/17 1520      PT LONG TERM GOAL #1   Title  report pain decreased 50% with ADL's    Time  8    Period  Weeks    Status  New      PT LONG TERM GOAL #2   Title  report no difficulty getting dressed or washing hair    Time  8    Period  Weeks    Status  New      PT LONG TERM GOAL #3   Title  increase left shoulder flexion to 140 degrees    Time  8    Period  Weeks    Status  New      PT LONG TERM GOAL #4   Title  increase left shoulder IR to 60 degrees    Time  8    Period  Weeks    Status  New      PT LONG TERM GOAL #5   Title  lift 5# overhead    Time  8    Period  Weeks    Status  New            Plan - 09/26/17 1100    Clinical Impression Statement  Pt able to complete all of today's activities. Weakness noted with L shoulder external rotation. L shoulder elevation noted with active shoulder flexion and abduction. Cues for pacing and to rest between sets. Reports some pain at end range of MT.    Rehab Potential  Good    PT Frequency  2x / week    PT Duration  8 weeks    PT Treatment/Interventions  ADLs/Self Care Home Management;Cryotherapy;Electrical Stimulation;Therapeutic activities;Therapeutic exercise;Patient/family education;Manual techniques;Vasopneumatic Device    PT Next Visit Plan  continue to work on AROM, scapular stabilization and shoulder strengthening        Patient will benefit from skilled therapeutic intervention in order to improve the following deficits and impairments:  Decreased range of motion, Increased muscle spasms, Impaired UE functional use, Pain, Postural dysfunction, Decreased strength  Visit Diagnosis:  Muscle weakness (generalized)  Stiffness of left shoulder, not elsewhere classified  Acute pain of left shoulder     Problem List Patient Active  Problem List   Diagnosis Date Noted  . Complete rotator cuff tear of left shoulder 07/12/2017  . Shoulder injury, left, initial encounter 06/01/2017  . PCP NOTES >>>>>>>>>>>>>>>>>>>>>>>> 10/04/2016  . Edema-- R>>L 08/29/2013  . Hyponatremia 12/31/2012  . DJD (degenerative joint disease) 10/18/2011  . Annual physical exam 04/17/2011  . Hyperglycemia   . Hyperlipidemia 08/28/2007  . Hypogonadism in male 01/02/2007  . LIVER FUNCTION TESTS, ABNORMAL 01/02/2007  . Depression  10/31/2006  . Attention deficit disorder--Dr. Nolen Mu 10/31/2006  . Essential hypertension 10/31/2006  . ALLERGIC RHINITIS 10/31/2006  . SLEEP APNEA 10/31/2006    Grayce Sessions, PTA 09/26/2017, 11:04 AM  [facility] [address] [place], [place], [address] Phone: [phone]   Fax:  [phone]  Name: James Boone MRN: [id number] Date of Birth: 12-Mar-1961

## 2017-09-28 ENCOUNTER — Encounter: Payer: Self-pay | Admitting: Physical Therapy

## 2017-09-28 ENCOUNTER — Ambulatory Visit: Payer: 59 | Admitting: Physical Therapy

## 2017-09-28 DIAGNOSIS — M6281 Muscle weakness (generalized): Secondary | ICD-10-CM | POA: Diagnosis not present

## 2017-09-28 DIAGNOSIS — M25512 Pain in left shoulder: Secondary | ICD-10-CM

## 2017-09-28 DIAGNOSIS — M25612 Stiffness of left shoulder, not elsewhere classified: Secondary | ICD-10-CM

## 2017-09-28 NOTE — Therapy (Signed)
[facility] [address] [place], [place], [address] Phone: [phone]   Fax:  [phone]  Physical Therapy Treatment  Patient Details  Name: James Boone MRN: [id number] Date of Birth: 1961/01/12 Referring Provider: Dion Saucier   Encounter Date: 09/28/2017  PT End of Session - 09/28/17 1010    Visit Number  7    Date for PT Re-Evaluation  11/03/17    PT Start Time  0930    PT Stop Time  1020    PT Time Calculation (min)  50 min    Activity Tolerance  Patient tolerated treatment well    Behavior During Therapy  [facility] for tasks assessed/performed       Past Medical History:  Diagnosis Date  . Abnormal liver function test   . Acquired cavovarus deformity of right foot 2016   Dr. Victorino Dike, using boot modification and [place] brace  . ADD (attention deficit disorder)   . ADD (attention deficit disorder)   . Allergic rhinitis   . Arthritis   . Back pain    lower  . Complete rotator cuff tear of left shoulder 07/12/2017  . Depression   . Hemorrhoid   . Hypercholesteremia   . Hyperglycemia   . Hyperlipidemia   . Hypertension   . Hypogonadism male   . Peripheral edema   . Sleep apnea 2005   dx in 2005 , on CPAP setting of 13  . Varicose veins    both legs    Past Surgical History:  Procedure Laterality Date  . HERNIA REPAIR  2001   abdominal hernia  . KNEE ARTHROSCOPY  05/2008   left, Dr.Geoffrey  . KNEE ARTHROSCOPY  2008   right, Dr.Geoffrey  . SHOULDER ARTHROSCOPY WITH ROTATOR CUFF REPAIR AND SUBACROMIAL DECOMPRESSION Left 07/12/2017   Procedure: LEFT SHOULDER ARTHROSCOPY DEBRIDEMENT, ACROMIOPLASTY, ROTATOR CUFF REPAIR;  Surgeon: Teryl Lucy, MD;  Location: [facility];  Service: Orthopedics;  Laterality: Left;  . TOTAL ANKLE REPLACEMENT Right 2016  . TOTAL KNEE ARTHROPLASTY Left 12/30/2012   Procedure: LEFT TOTAL KNEE ARTHROPLASTY;  Surgeon: Loanne Drilling, MD;  Location: [facility];  Service:  Orthopedics;  Laterality: Left;  . TOTAL KNEE ARTHROPLASTY Right 03/16/2014   Procedure: RIGHT TOTAL KNEE ARTHROPLASTY;  Surgeon: Loanne Drilling, MD;  Location: [facility];  Service: Orthopedics;  Laterality: Right;  Marland Kitchen VASECTOMY  1990  . wisdom teeth extractiion      There were no vitals filed for this visit.  Subjective Assessment - 09/28/17 0933    Subjective  "All right"    Currently in Pain?  No/denies                       [facility] Adult PT Treatment/Exercise - 09/28/17 0001      Shoulder Exercises: Seated   Other Seated Exercises  rows & lats 20lb 2x10       Shoulder Exercises: Standing   External Rotation  Both;15 reps;Theraband x2    Theraband Level (Shoulder External Rotation)  Level 2 (Red)    Internal Rotation  Strengthening;Left;Theraband;20 reps    Theraband Level (Shoulder Internal Rotation)  Level 2 (Red)    Flexion  Weights;20 reps;Both    Shoulder Flexion Weight (lbs)  1    ABduction  AROM;20 reps;Both    Extension  Strengthening;Both;20 reps ER at end    Theraband Level (Shoulder Extension)  Level 2 (Red)    Row  Strengthening;Both;20 reps;Theraband  ER at end range    Theraband Level (Shoulder Row)  Level 2 (Red)      Shoulder Exercises: ROM/Strengthening   UBE (Upper Arm Bike)  L2 6 min 4rev/28fwd      Shoulder Exercises: Body Blade   Flexion  30 seconds;2 reps    External Rotation  30 seconds;3 reps      Modalities   Modalities  Cryotherapy      Cryotherapy   Number Minutes Cryotherapy  10 Minutes    Cryotherapy Location  Shoulder    Type of Cryotherapy  Ice pack      Manual Therapy   Manual Therapy  Passive ROM    Passive ROM  L shoulder flexion, ER, abduction               PT Short Term Goals - 09/13/17 0929      PT SHORT TERM GOAL #1   Title  independenet with HEP    Status  Achieved        PT Long Term Goals - 09/03/17 1520      PT LONG TERM GOAL #1   Title  report pain decreased 50% with ADL's    Time  8     Period  Weeks    Status  New      PT LONG TERM GOAL #2   Title  report no difficulty getting dressed or washing hair    Time  8    Period  Weeks    Status  New      PT LONG TERM GOAL #3   Title  increase left shoulder flexion to 140 degrees    Time  8    Period  Weeks    Status  New      PT LONG TERM GOAL #4   Title  increase left shoulder IR to 60 degrees    Time  8    Period  Weeks    Status  New      PT LONG TERM GOAL #5   Title  lift 5# overhead    Time  8    Period  Weeks    Status  New            Plan - 09/28/17 1010    Clinical Impression Statement  Pt continues to do well with all activities. Some weakness demo ed with ER. Some L shoulder elevation noted with active shoulder flexion and abduction. Some pain at the end range of flexion with MT. Overall PROM is good     Rehab Potential  Good    PT Frequency  2x / week    PT Duration  8 weeks    PT Treatment/Interventions  ADLs/Self Care Home Management;Cryotherapy;Electrical Stimulation;Therapeutic activities;Therapeutic exercise;Patient/family education;Manual techniques;Vasopneumatic Device    PT Next Visit Plan  continue to work on AROM, scapular stabilization and shoulder strengthening        Patient will benefit from skilled therapeutic intervention in order to improve the following deficits and impairments:  Decreased range of motion, Increased muscle spasms, Impaired UE functional use, Pain, Postural dysfunction, Decreased strength  Visit Diagnosis: Muscle weakness (generalized)  Stiffness of left shoulder, not elsewhere classified  Acute pain of left shoulder     Problem List Patient Active Problem List   Diagnosis Date Noted  . Complete rotator cuff tear of left shoulder 07/12/2017  . Shoulder injury, left, initial encounter 06/01/2017  . PCP NOTES >>>>>>>>>>>>>>>>>>>>>>>> 10/04/2016  . Edema-- R>>L 08/29/2013  .  Hyponatremia 12/31/2012  . DJD (degenerative joint disease) 10/18/2011  .  Annual physical exam 04/17/2011  . Hyperglycemia   . Hyperlipidemia 08/28/2007  . Hypogonadism in male 01/02/2007  . LIVER FUNCTION TESTS, ABNORMAL 01/02/2007  . Depression  10/31/2006  . Attention deficit disorder--Dr. Nolen Mu 10/31/2006  . Essential hypertension 10/31/2006  . ALLERGIC RHINITIS 10/31/2006  . SLEEP APNEA 10/31/2006    Grayce Sessions, PTA 09/28/2017, 10:13 AM  [facility] [address] [place], [place], [address] Phone: [phone]   Fax:  [phone]  Name: SLAYDEN MENNENGA MRN: [id number] Date of Birth: January 15, 1961

## 2017-10-01 ENCOUNTER — Encounter: Payer: Self-pay | Admitting: Physical Therapy

## 2017-10-01 ENCOUNTER — Ambulatory Visit: Payer: 59 | Admitting: Physical Therapy

## 2017-10-01 DIAGNOSIS — M25512 Pain in left shoulder: Secondary | ICD-10-CM

## 2017-10-01 DIAGNOSIS — M25612 Stiffness of left shoulder, not elsewhere classified: Secondary | ICD-10-CM

## 2017-10-01 DIAGNOSIS — M6281 Muscle weakness (generalized): Secondary | ICD-10-CM | POA: Diagnosis not present

## 2017-10-01 NOTE — Therapy (Signed)
[facility] [address] [place], [place], [address] Phone: [phone]   Fax:  [phone]  Physical Therapy Treatment  Patient Details  Name: James Boone MRN: [id number] Date of Birth: 04-Nov-1960 Referring Provider: Dion Saucier   Encounter Date: 10/01/2017  PT End of Session - 10/01/17 1008    Visit Number  8    Date for PT Re-Evaluation  11/03/17    PT Start Time  0930    PT Stop Time  1020    PT Time Calculation (min)  50 min    Activity Tolerance  Patient tolerated treatment well    Behavior During Therapy  [facility] for tasks assessed/performed       Past Medical History:  Diagnosis Date  . Abnormal liver function test   . Acquired cavovarus deformity of right foot 2016   Dr. Victorino Dike, using boot modification and [place] brace  . ADD (attention deficit disorder)   . ADD (attention deficit disorder)   . Allergic rhinitis   . Arthritis   . Back pain    lower  . Complete rotator cuff tear of left shoulder 07/12/2017  . Depression   . Hemorrhoid   . Hypercholesteremia   . Hyperglycemia   . Hyperlipidemia   . Hypertension   . Hypogonadism male   . Peripheral edema   . Sleep apnea 2005   dx in 2005 , on CPAP setting of 13  . Varicose veins    both legs    Past Surgical History:  Procedure Laterality Date  . HERNIA REPAIR  2001   abdominal hernia  . KNEE ARTHROSCOPY  05/2008   left, Dr.Geoffrey  . KNEE ARTHROSCOPY  2008   right, Dr.Geoffrey  . SHOULDER ARTHROSCOPY WITH ROTATOR CUFF REPAIR AND SUBACROMIAL DECOMPRESSION Left 07/12/2017   Procedure: LEFT SHOULDER ARTHROSCOPY DEBRIDEMENT, ACROMIOPLASTY, ROTATOR CUFF REPAIR;  Surgeon: Teryl Lucy, MD;  Location: [facility];  Service: Orthopedics;  Laterality: Left;  . TOTAL ANKLE REPLACEMENT Right 2016  . TOTAL KNEE ARTHROPLASTY Left 12/30/2012   Procedure: LEFT TOTAL KNEE ARTHROPLASTY;  Surgeon: Loanne Drilling, MD;  Location: [facility];  Service:  Orthopedics;  Laterality: Left;  . TOTAL KNEE ARTHROPLASTY Right 03/16/2014   Procedure: RIGHT TOTAL KNEE ARTHROPLASTY;  Surgeon: Loanne Drilling, MD;  Location: [facility];  Service: Orthopedics;  Laterality: Right;  Marland Kitchen VASECTOMY  1990  . wisdom teeth extractiion      There were no vitals filed for this visit.  Subjective Assessment - 10/01/17 0929    Subjective  "Pretty good" Pt reports that his shoulder feels all right    Currently in Pain?  No/denies    Pain Score  0-No pain                       [facility] Adult PT Treatment/Exercise - 10/01/17 0001      Shoulder Exercises: Seated   Other Seated Exercises  rows & lats 25lb 2x10       Shoulder Exercises: Standing   External Rotation  Both;15 reps;Theraband    Theraband Level (Shoulder External Rotation)  Level 2 (Red)    Internal Rotation  Strengthening;Left;Theraband;20 reps    Theraband Level (Shoulder Internal Rotation)  Level 2 (Red)    Flexion  Weights;20 reps;Both    Shoulder Flexion Weight (lbs)  2    ABduction  AROM;20 reps;Both    Extension  Strengthening;Both;20 reps ER    Theraband Level (  Shoulder Extension)  Level 2 (Red)    Row  Strengthening;Both;20 reps;Theraband ER    Theraband Level (Shoulder Row)  Level 2 (Red)    Other Standing Exercises  flex up wall pillow case 2lb LUE 2x10       Shoulder Exercises: ROM/Strengthening   UBE (Upper Arm Bike)  L2  4rev/51fwd      Modalities   Modalities  Cryotherapy      Cryotherapy   Number Minutes Cryotherapy  10 Minutes    Cryotherapy Location  Shoulder    Type of Cryotherapy  Ice pack      Manual Therapy   Manual Therapy  Passive ROM    Passive ROM  L shoulder flexion, ER, abduction               PT Short Term Goals - 09/13/17 0929      PT SHORT TERM GOAL #1   Title  independenet with HEP    Status  Achieved        PT Long Term Goals - 09/03/17 1520      PT LONG TERM GOAL #1   Title  report pain decreased 50% with ADL's    Time  8     Period  Weeks    Status  New      PT LONG TERM GOAL #2   Title  report no difficulty getting dressed or washing hair    Time  8    Period  Weeks    Status  New      PT LONG TERM GOAL #3   Title  increase left shoulder flexion to 140 degrees    Time  8    Period  Weeks    Status  New      PT LONG TERM GOAL #4   Title  increase left shoulder IR to 60 degrees    Time  8    Period  Weeks    Status  New      PT LONG TERM GOAL #5   Title  lift 5# overhead    Time  8    Period  Weeks    Status  New            Plan - 10/01/17 1009    Clinical Impression Statement  Pt continues to progress towards goals. He is very guarded with MT today's, but only reports his usual pain. L shoulder elevation with both flexion and abduction. Cues to keep shoulders down with ER.    Rehab Potential  Good    PT Frequency  2x / week    PT Duration  8 weeks    PT Treatment/Interventions  ADLs/Self Care Home Management;Cryotherapy;Electrical Stimulation;Therapeutic activities;Therapeutic exercise;Patient/family education;Manual techniques;Vasopneumatic Device    PT Next Visit Plan  continue to work on AROM, scapular stabilization and shoulder strengthening        Patient will benefit from skilled therapeutic intervention in order to improve the following deficits and impairments:  Decreased range of motion, Increased muscle spasms, Impaired UE functional use, Pain, Postural dysfunction, Decreased strength  Visit Diagnosis: Muscle weakness (generalized)  Stiffness of left shoulder, not elsewhere classified  Acute pain of left shoulder     Problem List Patient Active Problem List   Diagnosis Date Noted  . Complete rotator cuff tear of left shoulder 07/12/2017  . Shoulder injury, left, initial encounter 06/01/2017  . PCP NOTES >>>>>>>>>>>>>>>>>>>>>>>> 10/04/2016  . Edema-- R>>L 08/29/2013  . Hyponatremia 12/31/2012  . DJD (  degenerative joint disease) 10/18/2011  . Annual physical  exam 04/17/2011  . Hyperglycemia   . Hyperlipidemia 08/28/2007  . Hypogonadism in male 01/02/2007  . LIVER FUNCTION TESTS, ABNORMAL 01/02/2007  . Depression  10/31/2006  . Attention deficit disorder--Dr. Nolen Mu 10/31/2006  . Essential hypertension 10/31/2006  . ALLERGIC RHINITIS 10/31/2006  . SLEEP APNEA 10/31/2006    Grayce Sessions, PTA 10/01/2017, 10:11 AM  [facility] [address] [place], [place], [address] Phone: [phone]   Fax:  [phone]  Name: COHEN BOETTNER MRN: [id number] Date of Birth: 1960/06/18

## 2017-10-02 MED FILL — lamoTRIgine 100 MG TABS: 100 | 30 days supply | Qty: 45 | Fill #1

## 2017-10-05 ENCOUNTER — Encounter: Payer: Self-pay | Admitting: Physical Therapy

## 2017-10-05 ENCOUNTER — Ambulatory Visit: Payer: 59 | Admitting: Physical Therapy

## 2017-10-05 DIAGNOSIS — M6281 Muscle weakness (generalized): Secondary | ICD-10-CM | POA: Diagnosis not present

## 2017-10-05 DIAGNOSIS — M25612 Stiffness of left shoulder, not elsewhere classified: Secondary | ICD-10-CM

## 2017-10-05 DIAGNOSIS — M25512 Pain in left shoulder: Secondary | ICD-10-CM

## 2017-10-05 NOTE — Therapy (Signed)
[facility] [place], [place], [address] Phone: [phone]   Fax:  [phone]  Physical Therapy Treatment  Patient Details  Name: James Boone MRN: [id number] Date of Birth: 1960-07-16 Referring Provider: Mardelle Matte   Encounter Date: 10/05/2017  PT End of Session - 10/05/17 1004    Visit Number  9    Date for PT Re-Evaluation  11/03/17    PT Start Time  0925    PT Stop Time  1015    PT Time Calculation (min)  50 min    Activity Tolerance  Patient tolerated treatment well    Behavior During Therapy  [facility] for tasks assessed/performed       Past Medical History:  Diagnosis Date  . Abnormal liver function test   . Acquired cavovarus deformity of right foot 2016   Dr. Doran Durand, using boot modification and [place] brace  . ADD (attention deficit disorder)   . ADD (attention deficit disorder)   . Allergic rhinitis   . Arthritis   . Back pain    lower  . Complete rotator cuff tear of left shoulder 07/12/2017  . Depression   . Hemorrhoid   . Hypercholesteremia   . Hyperglycemia   . Hyperlipidemia   . Hypertension   . Hypogonadism male   . Peripheral edema   . Sleep apnea 2005   dx in 2005 , on CPAP setting of 13  . Varicose veins    both legs    Past Surgical History:  Procedure Laterality Date  . HERNIA REPAIR  2001   abdominal hernia  . KNEE ARTHROSCOPY  05/2008   left, Dr.Geoffrey  . KNEE ARTHROSCOPY  2008   right, Dr.Geoffrey  . SHOULDER ARTHROSCOPY WITH ROTATOR CUFF REPAIR AND SUBACROMIAL DECOMPRESSION Left 07/12/2017   Procedure: LEFT SHOULDER ARTHROSCOPY DEBRIDEMENT, ACROMIOPLASTY, ROTATOR CUFF REPAIR;  Surgeon: Marchia Bond, MD;  Location: [place];  Service: Orthopedics;  Laterality: Left;  . TOTAL ANKLE REPLACEMENT Right 2016  . TOTAL KNEE ARTHROPLASTY Left 12/30/2012   Procedure: LEFT TOTAL KNEE ARTHROPLASTY;  Surgeon: Gearlean Alf, MD;  Location: [facility];  Service:  Orthopedics;  Laterality: Left;  . TOTAL KNEE ARTHROPLASTY Right 03/16/2014   Procedure: RIGHT TOTAL KNEE ARTHROPLASTY;  Surgeon: Gearlean Alf, MD;  Location: [facility];  Service: Orthopedics;  Laterality: Right;  Marland Kitchen VASECTOMY  1990  . wisdom teeth extractiion      There were no vitals filed for this visit.  Subjective Assessment - 10/05/17 0927    Subjective  "Its good"    Currently in Pain?  No/denies    Pain Score  0-No pain         [facility] PT Assessment - 10/05/17 0001      AROM   AROM Assessment Site  Shoulder Taken in sipine    Right/Left Shoulder  Left    Left Shoulder Flexion  143 Degrees    Left Shoulder ABduction  146 Degrees    Left Shoulder Internal Rotation  40 Degrees    Left Shoulder External Rotation  60 Degrees                   [facility] Adult PT Treatment/Exercise - 10/05/17 0001      Shoulder Exercises: Standing   External Rotation  Left;20 reps;Theraband    Theraband Level (Shoulder External Rotation)  Level 3 (Green)    Internal Rotation  Theraband;20 reps;Left    Theraband Level (  Shoulder Internal Rotation)  Level 3 (Green)    Extension  [employer] reps;Left    Theraband Level (Shoulder Extension)  Level 3 (Green)    Row  [employer];Theraband    Theraband Level (Shoulder Row)  Level 3 (Green)    Other Standing Exercises  adb up wall pillow case 2x10 LUE    Other Standing Exercises  flex up wall pillow case 2lb LUE 2x10       Shoulder Exercises: ROM/Strengthening   UBE (Upper Arm Bike)  L2  3rev/4fd      Modalities   Modalities  Cryotherapy      Cryotherapy   Number Minutes Cryotherapy  10 Minutes    Cryotherapy Location  Shoulder    Type of Cryotherapy  Ice pack      Manual Therapy   Manual Therapy  Passive ROM    Passive ROM  L shoulder flexion, ER, abduction               PT Short Term Goals - 09/13/17 0929      PT SHORT TERM GOAL #1   Title  independenet with HEP    Status  Achieved        PT Long Term  Goals - 10/05/17 1010      PT LONG TERM GOAL #4   Title  increase left shoulder IR to 60 degrees    Status  Partially Met            Plan - 10/05/17 1011    Clinical Impression Statement  Pt continues to do well and progress towards goals. some LTG's met. Postural cues needed for internal and external rotation against resistance. She does reports some L shoulder pain with flexion and abductio up wall.     Rehab Potential  Good    PT Frequency  2x / week    PT Duration  8 weeks    PT Treatment/Interventions  ADLs/Self Care Home Management;Cryotherapy;Electrical Stimulation;Therapeutic activities;Therapeutic exercise;Patient/family education;Manual techniques;Vasopneumatic Device    PT Next Visit Plan  continue to work on AROM, scapular stabilization and shoulder strengthening        Patient will benefit from skilled therapeutic intervention in order to improve the following deficits and impairments:  Decreased range of motion, Increased muscle spasms, Impaired UE functional use, Pain, Postural dysfunction, Decreased strength  Visit Diagnosis: Muscle weakness (generalized)  Stiffness of left shoulder, not elsewhere classified  Acute pain of left shoulder     Problem List Patient Active Problem List   Diagnosis Date Noted  . Complete rotator cuff tear of left shoulder 07/12/2017  . Shoulder injury, left, initial encounter 06/01/2017  . PCP NOTES >>>>>>>>>>>>>>>>>>>>>>>> 10/04/2016  . Edema-- R>>L 08/29/2013  . Hyponatremia 12/31/2012  . DJD (degenerative joint disease) 10/18/2011  . Annual physical exam 04/17/2011  . Hyperglycemia   . Hyperlipidemia 08/28/2007  . Hypogonadism in male 01/02/2007  . LIVER FUNCTION TESTS, ABNORMAL 01/02/2007  . Depression  10/31/2006  . Attention deficit disorder--Dr. MCaprice Beaver06/03/2007  . Essential hypertension 10/31/2006  . ALLERGIC RHINITIS 10/31/2006  . SLEEP APNEA 10/31/2006    RScot Jun PTA 10/05/2017, 10:12  AM  [place] [address]Phone: [phone]  Fax:  [phone] Name: James ALWINMRN: [id number] of Birth: 905/02/1961

## 2017-10-08 ENCOUNTER — Encounter: Payer: Self-pay | Admitting: Physical Therapy

## 2017-10-08 ENCOUNTER — Ambulatory Visit: Payer: 59 | Admitting: Physical Therapy

## 2017-10-08 DIAGNOSIS — M25612 Stiffness of left shoulder, not elsewhere classified: Secondary | ICD-10-CM | POA: Diagnosis not present

## 2017-10-08 DIAGNOSIS — M6281 Muscle weakness (generalized): Secondary | ICD-10-CM

## 2017-10-08 DIAGNOSIS — M25512 Pain in left shoulder: Secondary | ICD-10-CM

## 2017-10-08 NOTE — Therapy (Signed)
[place], [place], [address] Phone: [phone]   Fax:  [phone]  Physical Therapy Treatment  Patient Details  Name: James Boone MRN: [id number] Date of Birth: 03/25/1961 Referring Provider: Mardelle Matte   Encounter Date: 10/08/2017  PT End of Session - 10/08/17 1220    Visit Number  10    Date for PT Re-Evaluation  11/03/17    PT Start Time  1145    PT Stop Time  1230    PT Time Calculation (min)  45 min    Activity Tolerance  Patient tolerated treatment well    Behavior During Therapy  [facility] for tasks assessed/performed       Past Medical History:  Diagnosis Date  . Abnormal liver function test   . Acquired cavovarus deformity of right foot 2016   Dr. Doran Durand, using boot modification and [place] brace  . ADD (attention deficit disorder)   . ADD (attention deficit disorder)   . Allergic rhinitis   . Arthritis   . Back pain    lower  . Complete rotator cuff tear of left shoulder 07/12/2017  . Depression   . Hemorrhoid   . Hypercholesteremia   . Hyperglycemia   . Hyperlipidemia   . Hypertension   . Hypogonadism male   . Peripheral edema   . Sleep apnea 2005   dx in 2005 , on CPAP setting of 13  . Varicose veins    both legs    Past Surgical History:  Procedure Laterality Date  . HERNIA REPAIR  2001   abdominal hernia  . KNEE ARTHROSCOPY  05/2008   left, Dr.Geoffrey  . KNEE ARTHROSCOPY  2008   right, Dr.Geoffrey  . SHOULDER ARTHROSCOPY WITH ROTATOR CUFF REPAIR AND SUBACROMIAL DECOMPRESSION Left 07/12/2017   Procedure: LEFT SHOULDER ARTHROSCOPY DEBRIDEMENT, ACROMIOPLASTY, ROTATOR CUFF REPAIR;  Surgeon: Marchia Bond, MD;  Location: [facility];  Service: Orthopedics;  Laterality: Left;  . TOTAL ANKLE REPLACEMENT Right 2016  . TOTAL KNEE ARTHROPLASTY Left 12/30/2012   Procedure: LEFT TOTAL KNEE ARTHROPLASTY;  Surgeon: Gearlean Alf, MD;  [facility];  Service:  Orthopedics;  Laterality: Left;  . TOTAL KNEE ARTHROPLASTY Right 03/16/2014   Procedure: RIGHT TOTAL KNEE ARTHROPLASTY;  Surgeon: Gearlean Alf, MD;  [facility];  Service: Orthopedics;  Laterality: Right;  Marland Kitchen VASECTOMY  1990  . wisdom teeth extractiion      There were no vitals filed for this visit.  Subjective Assessment - 10/08/17 1150    Subjective  "Pretty good"    Currently in Pain?  No/denies    Pain Score  0-No pain                       [facility] Adult PT Treatment/Exercise - 10/08/17 0001      Shoulder Exercises: Supine   Flexion  Left;20 reps;Weights 90-140    Shoulder Flexion Weight (lbs)  2    Other Supine Exercises  IR/ER 2x10 2lb      Shoulder Exercises: Seated   Other Seated Exercises  rows & lats 25lb 2x10       Shoulder Exercises: Standing   Flexion  Weights;20 reps;Both    Shoulder Flexion Weight (lbs)  2    ABduction  AROM;20 reps;Both    Other Standing Exercises  abd up wall pillow case 2x10 LUE    Other Standing Exercises  flex up wall pillow case 2lb  LUE 2x10       Shoulder Exercises: ROM/Strengthening   UBE (Upper Arm Bike)  L2  3rev/3fwd      Shoulder Exercises: Power Tower   Other Power Tower Exercises  IR and ER pulley 5lb 2x10 each       Modalities   Modalities  Cryotherapy      Cryotherapy   Number Minutes Cryotherapy  10 Minutes    Cryotherapy Location  Shoulder    Type of Cryotherapy  Ice pack      Manual Therapy   Manual Therapy  Passive ROM    Passive ROM  L shoulder all directions and planes                PT Short Term Goals - 09/13/17 0929      PT SHORT TERM GOAL #1   Title  independenet with HEP    Status  Achieved        PT Long Term Goals - 10/05/17 1010      PT LONG TERM GOAL #4   Title  increase left shoulder IR to 60 degrees    Status  Partially Met            Plan - 10/08/17 1222    Clinical Impression Statement  Pt motion is good overall both passively and actively. Little  pain reported with some interventions such as IR & ER in supine. Cues required for pacing, pt often times will skip breaks between sets and continues on with reps. Cues for proper arm placement with supine ER & IR.     Rehab Potential  Good    PT Frequency  2x / week    PT Duration  8 weeks    PT Treatment/Interventions  ADLs/Self Care Home Management;Cryotherapy;Electrical Stimulation;Therapeutic activities;Therapeutic exercise;Patient/family education;Manual techniques;Vasopneumatic Device    PT Next Visit Plan  continue to work on AROM, scapular stabilization and shoulder strengthening        Patient will benefit from skilled therapeutic intervention in order to improve the following deficits and impairments:  Decreased range of motion, Increased muscle spasms, Impaired UE functional use, Pain, Postural dysfunction, Decreased strength  Visit Diagnosis: Stiffness of left shoulder, not elsewhere classified  Muscle weakness (generalized)  Acute pain of left shoulder     Problem List Patient Active Problem List   Diagnosis Date Noted  . Complete rotator cuff tear of left shoulder 07/12/2017  . Shoulder injury, left, initial encounter 06/01/2017  . PCP NOTES >>>>>>>>>>>>>>>>>>>>>>>> 10/04/2016  . Edema-- R>>L 08/29/2013  . Hyponatremia 12/31/2012  . DJD (degenerative joint disease) 10/18/2011  . Annual physical exam 04/17/2011  . Hyperglycemia   . Hyperlipidemia 08/28/2007  . Hypogonadism in male 01/02/2007  . LIVER FUNCTION TESTS, ABNORMAL 01/02/2007  . Depression  10/31/2006  . Attention deficit disorder--Dr. McKinney 10/31/2006  . Essential hypertension 10/31/2006  . ALLERGIC RHINITIS 10/31/2006  . SLEEP APNEA 10/31/2006    Ronald G Pemberton, PTA 10/08/2017, 12:28 PM  [facility] [address] [place], [place], [address] Phone: [phone]   Fax:  [phone]  Name: James Boone MRN: [id number] Date of  Birth: 01/07/1961   

## 2017-10-10 ENCOUNTER — Encounter: Payer: Self-pay | Admitting: Physical Therapy

## 2017-10-10 ENCOUNTER — Ambulatory Visit: Payer: 59 | Admitting: Physical Therapy

## 2017-10-10 DIAGNOSIS — M25612 Stiffness of left shoulder, not elsewhere classified: Secondary | ICD-10-CM | POA: Diagnosis not present

## 2017-10-10 DIAGNOSIS — M25512 Pain in left shoulder: Secondary | ICD-10-CM | POA: Diagnosis not present

## 2017-10-10 DIAGNOSIS — M6281 Muscle weakness (generalized): Secondary | ICD-10-CM | POA: Diagnosis not present

## 2017-10-10 NOTE — Therapy (Signed)
[place], [place], [address] Phone: [phone]   Fax:  [phone]  Physical Therapy Treatment  Patient Details  Name: James Boone MRN: [id number] Date of Birth: 04-15-1961 Referring Provider: Mardelle Matte   Encounter Date: 10/10/2017  PT End of Session - 10/10/17 1228    Visit Number  11    Date for PT Re-Evaluation  11/03/17    PT Start Time  1145    PT Stop Time  1235    PT Time Calculation (min)  50 min    Activity Tolerance  Patient tolerated treatment well    Behavior During Therapy  [facility] for tasks assessed/performed       Past Medical History:  Diagnosis Date  . Abnormal liver function test   . Acquired cavovarus deformity of right foot 2016   Dr. Doran Durand, using boot modification and [place] brace  . ADD (attention deficit disorder)   . ADD (attention deficit disorder)   . Allergic rhinitis   . Arthritis   . Back pain    lower  . Complete rotator cuff tear of left shoulder 07/12/2017  . Depression   . Hemorrhoid   . Hypercholesteremia   . Hyperglycemia   . Hyperlipidemia   . Hypertension   . Hypogonadism male   . Peripheral edema   . Sleep apnea 2005   dx in 2005 , on CPAP setting of 13  . Varicose veins    both legs    Past Surgical History:  Procedure Laterality Date  . HERNIA REPAIR  2001   abdominal hernia  . KNEE ARTHROSCOPY  05/2008   left, Dr.Geoffrey  . KNEE ARTHROSCOPY  2008   right, Dr.Geoffrey  . SHOULDER ARTHROSCOPY WITH ROTATOR CUFF REPAIR AND SUBACROMIAL DECOMPRESSION Left 07/12/2017   Procedure: LEFT SHOULDER ARTHROSCOPY DEBRIDEMENT, ACROMIOPLASTY, ROTATOR CUFF REPAIR;  Surgeon: Marchia Bond, MD;  Location: [facility];  Service: Orthopedics;  Laterality: Left;  . TOTAL ANKLE REPLACEMENT Right 2016  . TOTAL KNEE ARTHROPLASTY Left 12/30/2012   Procedure: LEFT TOTAL KNEE ARTHROPLASTY;  Surgeon: Gearlean Alf, MD;  [facility];  Service:  Orthopedics;  Laterality: Left;  . TOTAL KNEE ARTHROPLASTY Right 03/16/2014   Procedure: RIGHT TOTAL KNEE ARTHROPLASTY;  Surgeon: Gearlean Alf, MD;  [facility];  Service: Orthopedics;  Laterality: Right;  Marland Kitchen VASECTOMY  1990  . wisdom teeth extractiion      There were no vitals filed for this visit.  Subjective Assessment - 10/10/17 1150    Subjective  Pt reports some soreness after mondays session    Currently in Pain?  Yes    Pain Score  2     Pain Location  Shoulder    Pain Orientation  Left                       [facility] Adult PT Treatment/Exercise - 10/10/17 0001      Shoulder Exercises: Seated   Other Seated Exercises  seated bent over rows 4lb 2x10     Other Seated Exercises  rows & lats 25lb 2x10       Shoulder Exercises: Standing   Extension  Left;15 reps;Theraband x2    Theraband Level (Shoulder Extension)  Level 3 (Green)    Row  Left;Theraband;15 reps x2    Theraband Level (Shoulder Row)  Level 3 (Green)    Other Standing Exercises  abd and flex up wall pillow  case x10 each     Other Standing Exercises  3 level cabinet reached 2lb flex , abd no weight x10 each      Shoulder Exercises: ROM/Strengthening   UBE (Upper Arm Bike)  L4 n 4rev/70fd      Modalities   Modalities  Cryotherapy      Cryotherapy   Number Minutes Cryotherapy  10 Minutes    Cryotherapy Location  Shoulder    Type of Cryotherapy  Ice pack      Manual Therapy   Manual Therapy  Passive ROM    Passive ROM  L shoulder all directions and planes                PT Short Term Goals - 09/13/17 0929      PT SHORT TERM GOAL #1   Title  independenet with HEP    Status  Achieved        PT Long Term Goals - 10/05/17 1010      PT LONG TERM GOAL #4   Title  increase left shoulder IR to 60 degrees    Status  Partially Met            Plan - 10/10/17 1229    Clinical Impression Statement  Pt L shoulder PROM is good, he is still lacking a few degrees at end  range. Difficulty with 3 level cabinet reaches with 2lb dumbbell more so with abduction, some pain reported as well. Good strength with rows and extensions.    Rehab Potential  Good    PT Treatment/Interventions  ADLs/Self Care Home Management;Cryotherapy;Electrical Stimulation;Therapeutic activities;Therapeutic exercise;Patient/family education;Manual techniques;Vasopneumatic Device    PT Next Visit Plan  continue to work on AROM, scapular stabilization and shoulder strengthening        Patient will benefit from skilled therapeutic intervention in order to improve the following deficits and impairments:  Decreased range of motion, Increased muscle spasms, Impaired UE functional use, Pain, Postural dysfunction, Decreased strength  Visit Diagnosis: Muscle weakness (generalized)  Stiffness of left shoulder, not elsewhere classified  Acute pain of left shoulder     Problem List Patient Active Problem List   Diagnosis Date Noted  . Complete rotator cuff tear of left shoulder 07/12/2017  . Shoulder injury, left, initial encounter 06/01/2017  . PCP NOTES >>>>>>>>>>>>>>>>>>>>>>>> 10/04/2016  . Edema-- R>>L 08/29/2013  . Hyponatremia 12/31/2012  . DJD (degenerative joint disease) 10/18/2011  . Annual physical exam 04/17/2011  . Hyperglycemia   . Hyperlipidemia 08/28/2007  . Hypogonadism in male 01/02/2007  . LIVER FUNCTION TESTS, ABNORMAL 01/02/2007  . Depression  10/31/2006  . Attention deficit disorder--Dr. MCaprice Beaver06/03/2007  . Essential hypertension 10/31/2006  . ALLERGIC RHINITIS 10/31/2006  . SLEEP APNEA 10/31/2006    RScot Jun PTA 10/10/2017, 12:31 PM  [place] [address]Phone: [phone]  Fax:  [phone] Name: James HORNADAYMRN: [id number] of Birth: [birth year]

## 2017-10-16 ENCOUNTER — Encounter: Payer: Self-pay | Admitting: Physical Therapy

## 2017-10-16 ENCOUNTER — Ambulatory Visit: Payer: 59 | Admitting: Physical Therapy

## 2017-10-16 DIAGNOSIS — M25612 Stiffness of left shoulder, not elsewhere classified: Secondary | ICD-10-CM

## 2017-10-16 DIAGNOSIS — M25512 Pain in left shoulder: Secondary | ICD-10-CM

## 2017-10-16 DIAGNOSIS — M6281 Muscle weakness (generalized): Secondary | ICD-10-CM | POA: Diagnosis not present

## 2017-10-16 NOTE — Therapy (Signed)
[place], [place], [address] Phone: [phone]   Fax:  [phone]  Physical Therapy Treatment  Patient Details  Name: James Boone MRN: [id number] Date of Birth: 1960/12/27 Referring Provider: Mardelle Matte   Encounter Date: 10/16/2017  PT End of Session - 10/16/17 1142    Visit Number  12    Date for PT Re-Evaluation  11/03/17    PT Start Time  1100    PT Stop Time  1152    PT Time Calculation (min)  52 min    Activity Tolerance  Patient tolerated treatment well    Behavior During Therapy  [facility] for tasks assessed/performed       Past Medical History:  Diagnosis Date  . Abnormal liver function test   . Acquired cavovarus deformity of right foot 2016   Dr. Doran Durand, using boot modification and [place] brace  . ADD (attention deficit disorder)   . ADD (attention deficit disorder)   . Allergic rhinitis   . Arthritis   . Back pain    lower  . Complete rotator cuff tear of left shoulder 07/12/2017  . Depression   . Hemorrhoid   . Hypercholesteremia   . Hyperglycemia   . Hyperlipidemia   . Hypertension   . Hypogonadism male   . Peripheral edema   . Sleep apnea 2005   dx in 2005 , on CPAP setting of 13  . Varicose veins    both legs    Past Surgical History:  Procedure Laterality Date  . HERNIA REPAIR  2001   abdominal hernia  . KNEE ARTHROSCOPY  05/2008   left, Dr.Geoffrey  . KNEE ARTHROSCOPY  2008   right, Dr.Geoffrey  . SHOULDER ARTHROSCOPY WITH ROTATOR CUFF REPAIR AND SUBACROMIAL DECOMPRESSION Left 07/12/2017   Procedure: LEFT SHOULDER ARTHROSCOPY DEBRIDEMENT, ACROMIOPLASTY, ROTATOR CUFF REPAIR;  Surgeon: Marchia Bond, MD;  Location: [facility];  Service: Orthopedics;  Laterality: Left;  . TOTAL ANKLE REPLACEMENT Right 2016  . TOTAL KNEE ARTHROPLASTY Left 12/30/2012   Procedure: LEFT TOTAL KNEE ARTHROPLASTY;  Surgeon: Gearlean Alf, MD;  [facility];  Service:  Orthopedics;  Laterality: Left;  . TOTAL KNEE ARTHROPLASTY Right 03/16/2014   Procedure: RIGHT TOTAL KNEE ARTHROPLASTY;  Surgeon: Gearlean Alf, MD;  [facility];  Service: Orthopedics;  Laterality: Right;  Marland Kitchen VASECTOMY  1990  . wisdom teeth extractiion      There were no vitals filed for this visit.  Subjective Assessment - 10/16/17 1101    Subjective  "Pretty good" Pt reports certain positions cause pain, "Going above my shoulder"    Currently in Pain?  No/denies    Pain Score  0-No pain                       [facility] Adult PT Treatment/Exercise - 10/16/17 0001      Shoulder Exercises: Supine   Flexion  Left;20 reps;Weights    Shoulder Flexion Weight (lbs)  2    Other Supine Exercises  IR/ER 2x10 2lb      Shoulder Exercises: Seated   Other Seated Exercises  rows & lats 25lb 2x10       Shoulder Exercises: Standing   Other Standing Exercises  abd and flex up wall pillow case 2x10 each     Other Standing Exercises  3 level cabinet reached 2lb flex , abd no weight x15 each  Shoulder Exercises: Pulleys   Other Pulley Exercises  LUE ER/IR 5lb 2x10       Shoulder Exercises: ROM/Strengthening   UBE (Upper Arm Bike)  L4 n 4rev/50fd      Modalities   Modalities  Cryotherapy      Cryotherapy   Number Minutes Cryotherapy  10 Minutes    Cryotherapy Location  Shoulder    Type of Cryotherapy  Ice pack      Manual Therapy   Manual Therapy  Passive ROM    Passive ROM  L shoulder all directions and planes                PT Short Term Goals - 09/13/17 0929      PT SHORT TERM GOAL #1   Title  independenet with HEP    Status  Achieved        PT Long Term Goals - 10/05/17 1010      PT LONG TERM GOAL #4   Title  increase left shoulder IR to 60 degrees    Status  Partially Met            Plan - 10/16/17 1143    Clinical Impression Statement  Pt with some pain and discomfort during L shoulder abduction above his head. Good strength with  Rows and lat pull downs. Most MT focused on IR/ER and keeping his shoulder protruding anteriorly. L shoulder elevation remains with flexion and extension    Rehab Potential  Good    PT Frequency  2x / week    PT Duration  8 weeks    PT Next Visit Plan  continue to work on AROM, scapular stabilization and shoulder strengthening        Patient will benefit from skilled therapeutic intervention in order to improve the following deficits and impairments:  Decreased range of motion, Increased muscle spasms, Impaired UE functional use, Pain, Postural dysfunction, Decreased strength  Visit Diagnosis: Stiffness of left shoulder, not elsewhere classified  Muscle weakness (generalized)  Acute pain of left shoulder     Problem List Patient Active Problem List   Diagnosis Date Noted  . Complete rotator cuff tear of left shoulder 07/12/2017  . Shoulder injury, left, initial encounter 06/01/2017  . PCP NOTES >>>>>>>>>>>>>>>>>>>>>>>> 10/04/2016  . Edema-- R>>L 08/29/2013  . Hyponatremia 12/31/2012  . DJD (degenerative joint disease) 10/18/2011  . Annual physical exam 04/17/2011  . Hyperglycemia   . Hyperlipidemia 08/28/2007  . Hypogonadism in male 01/02/2007  . LIVER FUNCTION TESTS, ABNORMAL 01/02/2007  . Depression  10/31/2006  . Attention deficit disorder--Dr. MCaprice Beaver06/03/2007  . Essential hypertension 10/31/2006  . ALLERGIC RHINITIS 10/31/2006  . SLEEP APNEA 10/31/2006    RScot Jun PTA 10/16/2017, 11:45 AM  [place] [address]Phone: [phone]  Fax:  [phone] Name: James HONSEMRN: [id number] of Birth: 907/06/1960

## 2017-10-18 ENCOUNTER — Ambulatory Visit: Payer: 59 | Admitting: Physical Therapy

## 2017-10-18 ENCOUNTER — Encounter: Payer: Self-pay | Admitting: Physical Therapy

## 2017-10-18 DIAGNOSIS — M25612 Stiffness of left shoulder, not elsewhere classified: Secondary | ICD-10-CM | POA: Diagnosis not present

## 2017-10-18 DIAGNOSIS — M25512 Pain in left shoulder: Secondary | ICD-10-CM | POA: Diagnosis not present

## 2017-10-18 DIAGNOSIS — M6281 Muscle weakness (generalized): Secondary | ICD-10-CM | POA: Diagnosis not present

## 2017-10-18 NOTE — Therapy (Addendum)
[place], [place], [address] Phone: [phone]   Fax:  [phone]  Physical Therapy Treatment  Patient Details  Name: James Boone MRN: [id number] Date of Birth: 1961-04-12 Referring Provider: Mardelle Matte   Encounter Date: 10/18/2017  PT End of Session - 10/18/17 1143    Visit Number  13    Date for PT Re-Evaluation  11/03/17    PT Start Time  1100    PT Stop Time  1152    PT Time Calculation (min)  52 min       Past Medical History:  Diagnosis Date  . Abnormal liver function test   . Acquired cavovarus deformity of right foot 2016   Dr. Doran Durand, using boot modification and [place] brace  . ADD (attention deficit disorder)   . ADD (attention deficit disorder)   . Allergic rhinitis   . Arthritis   . Back pain    lower  . Complete rotator cuff tear of left shoulder 07/12/2017  . Depression   . Hemorrhoid   . Hypercholesteremia   . Hyperglycemia   . Hyperlipidemia   . Hypertension   . Hypogonadism male   . Peripheral edema   . Sleep apnea 2005   dx in 2005 , on CPAP setting of 13  . Varicose veins    both legs    Past Surgical History:  Procedure Laterality Date  . HERNIA REPAIR  2001   abdominal hernia  . KNEE ARTHROSCOPY  05/2008   left, Dr.Geoffrey  . KNEE ARTHROSCOPY  2008   right, Dr.Geoffrey  . SHOULDER ARTHROSCOPY WITH ROTATOR CUFF REPAIR AND SUBACROMIAL DECOMPRESSION Left 07/12/2017   Procedure: LEFT SHOULDER ARTHROSCOPY DEBRIDEMENT, ACROMIOPLASTY, ROTATOR CUFF REPAIR;  Surgeon: Marchia Bond, MD;  Location: [facility];  Service: Orthopedics;  Laterality: Left;  . TOTAL ANKLE REPLACEMENT Right 2016  . TOTAL KNEE ARTHROPLASTY Left 12/30/2012   Procedure: LEFT TOTAL KNEE ARTHROPLASTY;  Surgeon: Gearlean Alf, MD;  [facility];  Service: Orthopedics;  Laterality: Left;  . TOTAL KNEE ARTHROPLASTY Right 03/16/2014   Procedure: RIGHT TOTAL KNEE ARTHROPLASTY;   Surgeon: Gearlean Alf, MD;  [facility];  Service: Orthopedics;  Laterality: Right;  Marland Kitchen VASECTOMY  1990  . wisdom teeth extractiion      There were no vitals filed for this visit.  Subjective Assessment - 10/18/17 1103    Subjective  "Going ok"    Currently in Pain?  No/denies    Pain Score  0-No pain         [facility] PT Assessment - 10/18/17 0001      AROM   Overall AROM Comments  Taken while pt in supine    AROM Assessment Site  Shoulder    Right/Left Shoulder  Left    Left Shoulder Flexion  161 Degrees    Left Shoulder ABduction  163 Degrees    Left Shoulder Internal Rotation  73 Degrees    Left Shoulder External Rotation  63 Degrees                   [facility] Adult PT Treatment/Exercise - 10/18/17 0001      Shoulder Exercises: Seated   Other Seated Exercises  rows & lats 25lb 2x10       Shoulder Exercises: Standing   Flexion  Weights;20 reps;Both    Shoulder Flexion Weight (lbs)  1    ABduction  AROM;20 reps;Both;Weights  Shoulder ABduction Weight (lbs)  1    Extension  Left;15 reps;Theraband;Limitations x2    Theraband Level (Shoulder Extension)  Level 3 (Green)    Row  Left;Theraband;15 reps x2    Theraband Level (Shoulder Row)  Level 3 (Green)    Other Standing Exercises  3 level cabinet reached 2lb flex , abd no weight x15 each      Shoulder Exercises: Pulleys   Other Pulley Exercises  LUE ER/IR 5lb 2x10       Shoulder Exercises: ROM/Strengthening   UBE (Upper Arm Bike)  L4 n 4rev/56fd      Modalities   Modalities  Cryotherapy      Cryotherapy   Number Minutes Cryotherapy  10 Minutes    Cryotherapy Location  Shoulder    Type of Cryotherapy  Ice pack      Manual Therapy   Manual Therapy  Passive ROM    Passive ROM  L shoulder IR/ER               PT Short Term Goals - 09/13/17 0929      PT SHORT TERM GOAL #1   Title  independenet with HEP    Status  Achieved        PT Long Term Goals - 10/05/17 1010      PT LONG  TERM GOAL #4   Title  increase left shoulder IR to 60 degrees    Status  Partially Met            Plan - 10/18/17 1144    Clinical Impression Statement  ROM measurements taken in supine. Pt AROM has improved in all planes. Continues to be strong with rows and lats. L shoulder elevation with active abduction and flexion. He does reports some pain during  PROM at end ranges.     Rehab Potential  Good    PT Frequency  2x / week    PT Duration  8 weeks    PT Treatment/Interventions  ADLs/Self Care Home Management;Cryotherapy;Electrical Stimulation;Therapeutic activities;Therapeutic exercise;Patient/family education;Manual techniques;Vasopneumatic Device    PT Next Visit Plan  continue to work on AROM, scapular stabilization and shoulder strengthening        Patient will benefit from skilled therapeutic intervention in order to improve the following deficits and impairments:  Decreased range of motion, Increased muscle spasms, Impaired UE functional use, Pain, Postural dysfunction, Decreased strength  Visit Diagnosis: Stiffness of left shoulder, not elsewhere classified  Muscle weakness (generalized)  Acute pain of left shoulder     Problem List Patient Active Problem List   Diagnosis Date Noted  . Complete rotator cuff tear of left shoulder 07/12/2017  . Shoulder injury, left, initial encounter 06/01/2017  . PCP NOTES >>>>>>>>>>>>>>>>>>>>>>>> 10/04/2016  . Edema-- R>>L 08/29/2013  . Hyponatremia 12/31/2012  . DJD (degenerative joint disease) 10/18/2011  . Annual physical exam 04/17/2011  . Hyperglycemia   . Hyperlipidemia 08/28/2007  . Hypogonadism in male 01/02/2007  . LIVER FUNCTION TESTS, ABNORMAL 01/02/2007  . Depression  10/31/2006  . Attention deficit disorder--Dr. MCaprice Beaver06/03/2007  . Essential hypertension 10/31/2006  . ALLERGIC RHINITIS 10/31/2006  . SLEEP APNEA 10/31/2006   PHYSICAL THERAPY DISCHARGE SUMMARY  Visits from Start of Care: 13 Plan: Patient  agrees to discharge.  Patient goals were partially met. Patient is being discharged due to being pleased with the current functional level.  ?????       RScot Jun PTA 10/18/2017, 11:46 AM  [place], [place], [address] Phone: [phone]   Fax:  [phone]  Name: James Boone MRN: [id number] Date of Birth: [birth year]

## 2017-10-19 MED FILL — buPROPion HCL ER (XL) 150 M: 150 | 30 days supply | Qty: 30 | Fill #2

## 2017-10-19 MED FILL — ADDERALL XR 20 MG CAP SA: 20 | 30 days supply | Qty: 60 | Fill #0

## 2017-10-22 DIAGNOSIS — S46012D Strain of muscle(s) and tendon(s) of the rotator cuff of left shoulder, subsequent encounter: Secondary | ICD-10-CM | POA: Diagnosis not present

## 2017-10-26 MED FILL — SERTRALINE HCL 100 MG TAB: 100 | 30 days supply | Qty: 30 | Fill #1

## 2017-10-31 MED FILL — lamoTRIgine 100 MG TABS: 100 | 30 days supply | Qty: 45 | Fill #2

## 2017-11-13 ENCOUNTER — Other Ambulatory Visit: Payer: Self-pay | Admitting: Internal Medicine

## 2017-11-13 MED FILL — ATORVASTATIN 10 MG TABLET: 10 | 90 days supply | Qty: 90 | Fill #1

## 2017-11-13 MED FILL — METOPROLOL TARTRATE 50 MG T: 50 | 90 days supply | Qty: 180 | Fill #0

## 2017-11-20 MED FILL — ADDERALL XR 20 MG CAP SA: 20 | 30 days supply | Qty: 60 | Fill #0

## 2017-11-23 MED FILL — SERTRALINE HCL 100 MG TAB: 100 | 30 days supply | Qty: 30 | Fill #2

## 2017-11-23 MED FILL — buPROPion HCL ER (XL) 150 M: 150 | 30 days supply | Qty: 30 | Fill #3

## 2017-12-04 MED FILL — lamoTRIgine 100 MG TABS: 100 | 30 days supply | Qty: 45 | Fill #3

## 2017-12-07 DIAGNOSIS — F39 Unspecified mood [affective] disorder: Secondary | ICD-10-CM | POA: Diagnosis not present

## 2017-12-10 DIAGNOSIS — S46012D Strain of muscle(s) and tendon(s) of the rotator cuff of left shoulder, subsequent encounter: Secondary | ICD-10-CM | POA: Diagnosis not present

## 2017-12-21 MED FILL — ADDERALL XR 20 MG CAP SA: 20 | 30 days supply | Qty: 60 | Fill #0

## 2017-12-21 MED FILL — buPROPion HCL ER (XL) 150 M: 150 | 30 days supply | Qty: 30 | Fill #0

## 2017-12-21 MED FILL — SERTRALINE HCL 100 MG TAB: 100 | 30 days supply | Qty: 30 | Fill #3

## 2018-01-11 ENCOUNTER — Other Ambulatory Visit: Payer: Self-pay | Admitting: Internal Medicine

## 2018-01-11 MED FILL — lamoTRIgine 150 MG TABS: 150 | 30 days supply | Qty: 30 | Fill #0

## 2018-01-14 MED FILL — TESTOSTERONE CYP 200 MG/ML: 200 | 84 days supply | Qty: 6 | Fill #0

## 2018-01-22 MED FILL — buPROPion HCL ER (XL) 150 M: 150 | 30 days supply | Qty: 30 | Fill #1

## 2018-01-22 MED FILL — SERTRALINE HCL 100 MG TAB: 100 | 30 days supply | Qty: 30 | Fill #0

## 2018-01-22 MED FILL — ADDERALL XR 20 MG CAP SA: 20 | 30 days supply | Qty: 60 | Fill #0

## 2018-01-23 DIAGNOSIS — S46012D Strain of muscle(s) and tendon(s) of the rotator cuff of left shoulder, subsequent encounter: Secondary | ICD-10-CM | POA: Diagnosis not present

## 2018-01-23 IMAGING — MR MR SHOULDER*L* W/O CM
5 series · 33 of 40 positions shown · non-contrast
Comparison: Left shoulder radiographs 05/01/2017.

CLINICAL DATA: Shoulder pain with limited range of motion since
falling approximately 6 weeks ago. No previous relevant surgery.

EXAM:
MRI OF THE LEFT SHOULDER WITHOUT CONTRAST
TECHNIQUE: Multiplanar, multisequence MR imaging of the shoulder was performed.
No intravenous contrast was administered.

[Series 4: T2 fat-sat · axial · 4.0mm · 0.62mm/px · z∈[-88,+5]mm · 8 of 23 slices shown (1 of 3)]
[im 1/23]
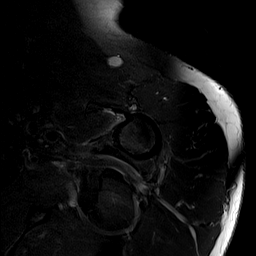
[im 4/23]
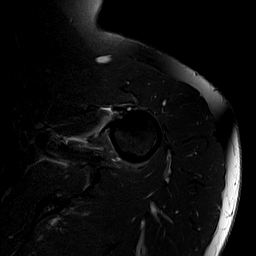
[im 7/23]
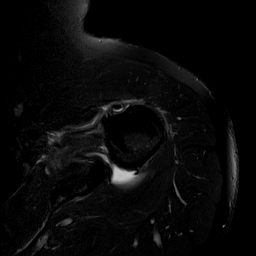
[im 10/23]
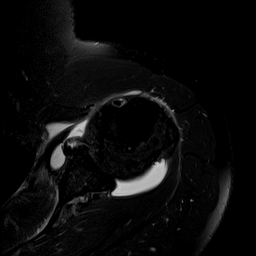
[im 13/23]
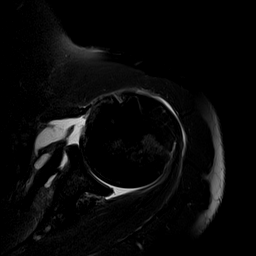
[im 16/23]
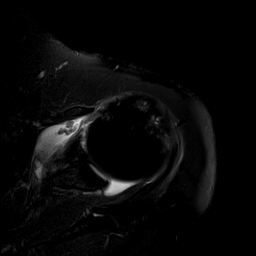
[im 19/23]
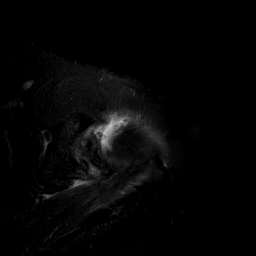
[im 23/23]
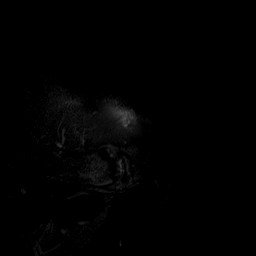

[Series 5: T2 fat-sat · oblique · 4.0mm · 0.62mm/px · 8 of 23 slices shown (2 of 3)]
[im 1/23]
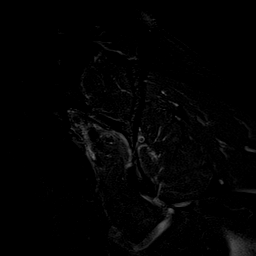
[im 4/23]
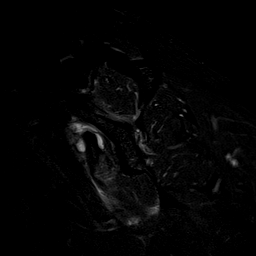
[im 7/23]
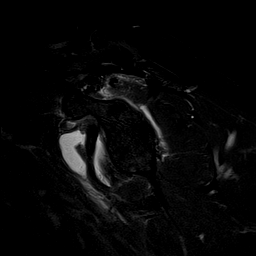
[im 10/23]
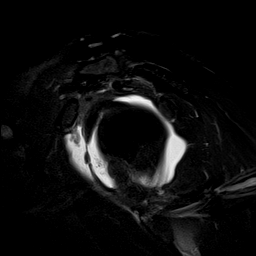
[im 13/23]
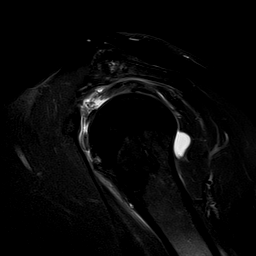
[im 16/23]
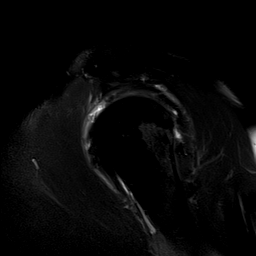
[im 19/23]
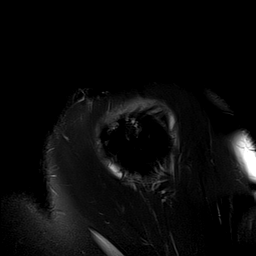
[im 23/23]
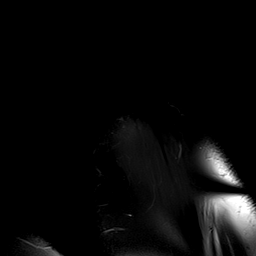

[Series 6: T1 · oblique · 4.0mm · 0.25mm/px · 1 of 23 slices shown]
[im 1/23]
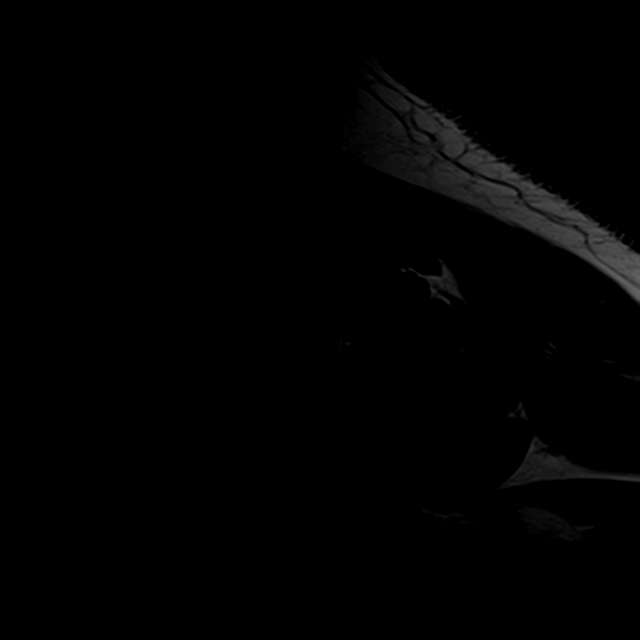

[Series 7: T2 fat-sat · oblique · 4.0mm · 0.31mm/px · 8 of 23 slices shown (3 of 3)]
[im 1/23]
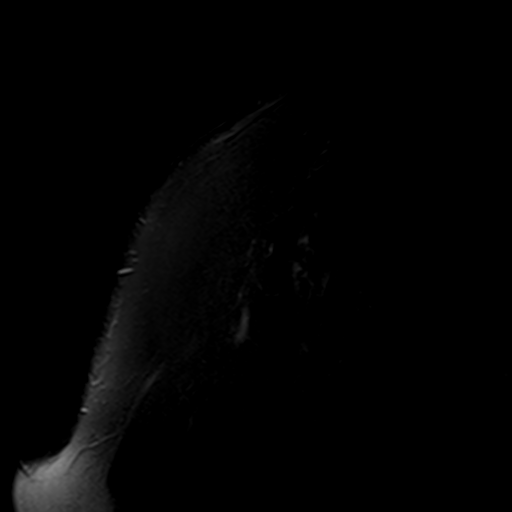
[im 4/23]
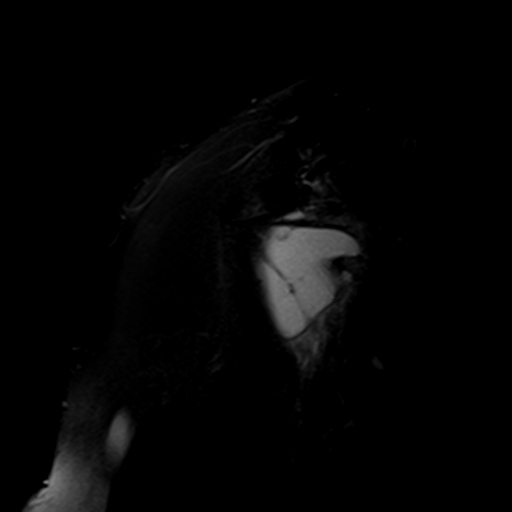
[im 7/23]
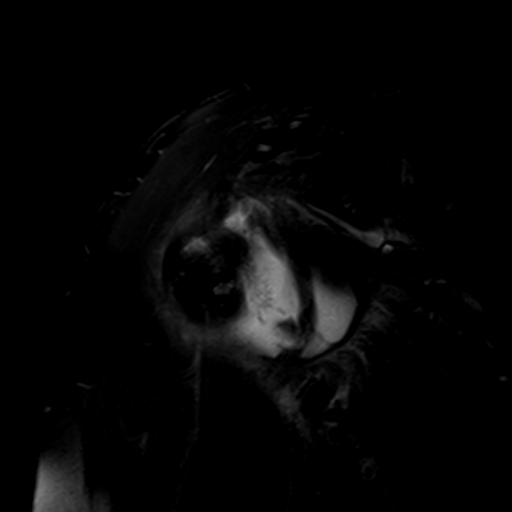
[im 10/23]
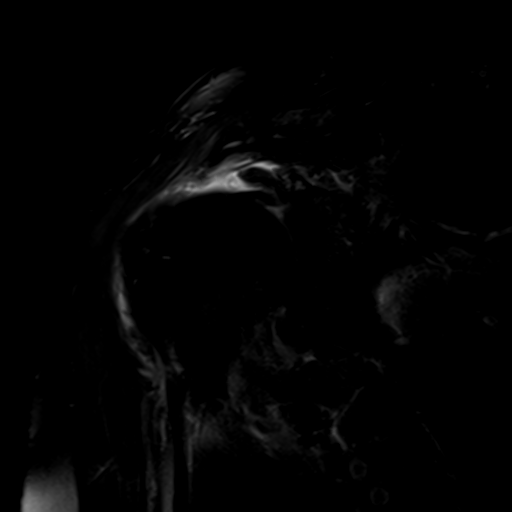
[im 13/23]
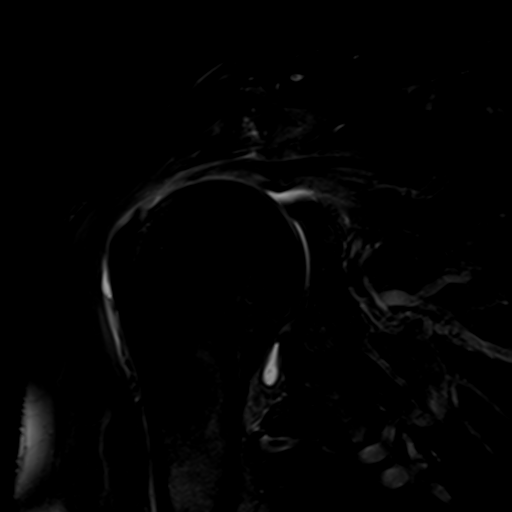
[im 16/23]
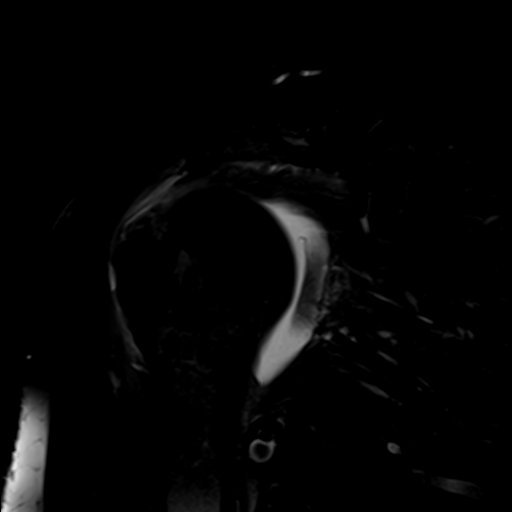
[im 19/23]
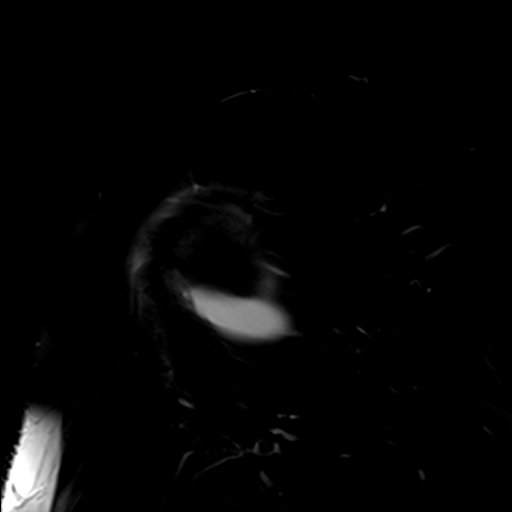
[im 23/23]
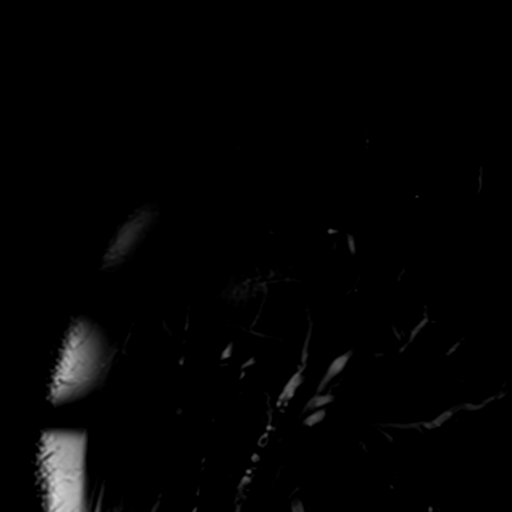

[Series 8: PD · oblique · 4.0mm · 0.50mm/px · 8 of 23 slices shown]
[im 1/23]
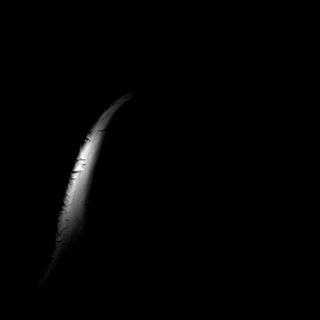
[im 4/23]
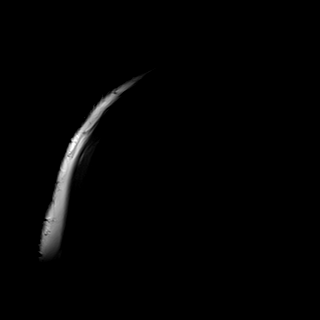
[im 7/23]
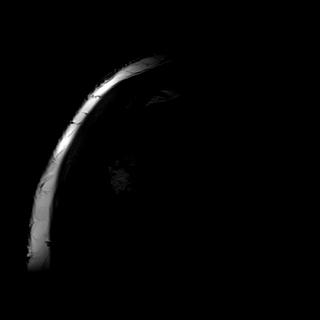
[im 10/23]
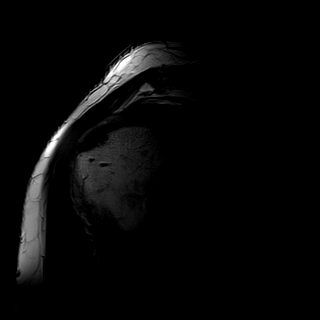
[im 13/23]
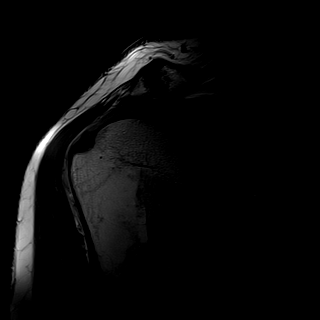
[im 16/23]
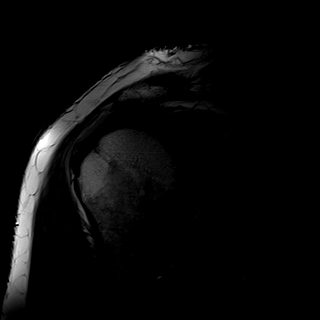
[im 19/23]
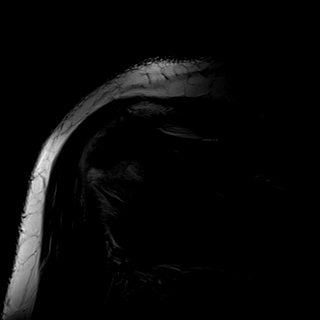
[im 23/23]
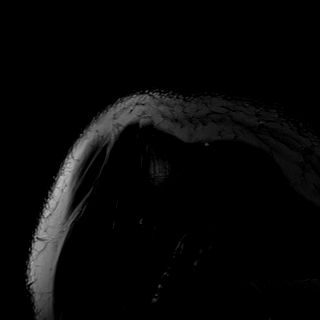

[33 of 40 positions shown; findings below may reference images not displayed]

FINDINGS: Rotator cuff: There is a large full-thickness rotator cuff tear. The
anterior half the supraspinatus tendon demonstrates full-thickness
tearing an approximately 3 cm of tendon retraction. There is a
nearly complete tear of the subscapularis tendon with retraction to
the level of the anterior glenoid. Mild infraspinatus tendinosis is
present without evidence of tear. The teres minor tendon appears.

Muscles: There is some edema within the subscapularis muscle, but no
appreciable muscle atrophy.

Biceps long head:  Intact and normally positioned.

Acromioclavicular Joint: The acromion is type 2. There are moderate
acromioclavicular degenerative changes. Only a small amount of fluid
is present in the subacromial-subdeltoid bursa, although there is
prominent fluid anteriorly in the subcoracoid bursa.

Glenohumeral Joint: Moderate size joint effusion. No significant
glenohumeral arthropathy. The coracohumeral ligament appears
thickened.

Labrum:  No evidence of labral tear or paralabral cyst.

Bones: No acute or significant extra-articular osseous findings.

Other: No significant soft tissue findings.
IMPRESSION: 1. Large full-thickness rotator cuff tear involving the
subscapularis and anterior half of the supraspinatus tendons. No
significant muscular atrophy.
2. The biceps tendon and labrum appear intact.
3. Moderate acromioclavicular degenerative changes.
4. Prominent fluid in the subcoracoid bursa.

## 2018-02-11 MED FILL — SUBVENITE 150 MG TABS: 150 | 30 days supply | Qty: 30 | Fill #1

## 2018-02-20 MED FILL — METOPROLOL TARTRATE 50 MG T: 50 | 90 days supply | Qty: 180 | Fill #1

## 2018-02-25 MED FILL — SERTRALINE HCL 100 MG TAB: 100 | 30 days supply | Qty: 30 | Fill #1

## 2018-02-25 MED FILL — buPROPion HCL ER (XL) 150 M: 150 | 30 days supply | Qty: 30 | Fill #2

## 2018-02-26 MED FILL — ATORVASTATIN 10 MG TABLET: 10 | 90 days supply | Qty: 90 | Fill #2

## 2018-02-27 MED FILL — ADDERALL XR 20 MG CAP SA: 20 | 30 days supply | Qty: 60 | Fill #0

## 2018-03-20 MED FILL — SUBVENITE 150 MG TABS: 150 | 30 days supply | Qty: 30 | Fill #2

## 2018-03-29 ENCOUNTER — Ambulatory Visit: Payer: Self-pay | Admitting: Psychiatry

## 2018-04-03 MED FILL — buPROPion HCL ER (XL) 150 M: 150 | 30 days supply | Qty: 30 | Fill #3

## 2018-04-03 MED FILL — SERTRALINE HCL 100 MG TAB: 100 | 30 days supply | Qty: 30 | Fill #2

## 2018-04-05 MED FILL — ADDERALL XR 20 MG CAP SA: 20 | 30 days supply | Qty: 60 | Fill #0

## 2018-04-16 ENCOUNTER — Encounter: Payer: 59 | Admitting: Internal Medicine

## 2018-04-17 MED FILL — SUBVENITE 150 MG TABS: 150 | 30 days supply | Qty: 30 | Fill #3

## 2018-05-01 ENCOUNTER — Encounter: Payer: Self-pay | Admitting: Internal Medicine

## 2018-05-01 ENCOUNTER — Ambulatory Visit (INDEPENDENT_AMBULATORY_CARE_PROVIDER_SITE_OTHER): Payer: 59 | Admitting: Internal Medicine

## 2018-05-01 VITALS — BP 142/70 | HR 66 | Temp 97.9°F | Resp 16 | Ht 76.0 in | Wt 322.5 lb

## 2018-05-01 DIAGNOSIS — Z0000 Encounter for general adult medical examination without abnormal findings: Secondary | ICD-10-CM

## 2018-05-01 DIAGNOSIS — R739 Hyperglycemia, unspecified: Secondary | ICD-10-CM

## 2018-05-01 DIAGNOSIS — E291 Testicular hypofunction: Secondary | ICD-10-CM | POA: Diagnosis not present

## 2018-05-01 LAB — TSH: TSH: 2.71 u[IU]/mL (ref 0.35–4.50)

## 2018-05-01 LAB — CBC WITH DIFFERENTIAL/PLATELET
Basophils Absolute: 0 10*3/uL (ref 0.0–0.1)
Basophils Relative: 0.6 % (ref 0.0–3.0)
EOS PCT: 5.1 % — AB (ref 0.0–5.0)
Eosinophils Absolute: 0.3 10*3/uL (ref 0.0–0.7)
HCT: 45.8 % (ref 39.0–52.0)
Hemoglobin: 15.2 g/dL (ref 13.0–17.0)
LYMPHS ABS: 1.7 10*3/uL (ref 0.7–4.0)
Lymphocytes Relative: 27.7 % (ref 12.0–46.0)
MCHC: 33.1 g/dL (ref 30.0–36.0)
MCV: 87.9 fl (ref 78.0–100.0)
MONO ABS: 0.7 10*3/uL (ref 0.1–1.0)
Monocytes Relative: 10.9 % (ref 3.0–12.0)
NEUTROS PCT: 55.7 % (ref 43.0–77.0)
Neutro Abs: 3.4 10*3/uL (ref 1.4–7.7)
PLATELETS: 239 10*3/uL (ref 150.0–400.0)
RBC: 5.22 Mil/uL (ref 4.22–5.81)
RDW: 15.9 % — AB (ref 11.5–15.5)
WBC: 6 10*3/uL (ref 4.0–10.5)

## 2018-05-01 LAB — LIPID PANEL
CHOL/HDL RATIO: 3
Cholesterol: 111 mg/dL (ref 0–200)
HDL: 42.2 mg/dL (ref >39.00)
LDL CALC: 49 mg/dL (ref 0–99)
NonHDL: 68.41
Triglycerides: 97 mg/dL (ref 0.0–149.0)
VLDL: 19.4 mg/dL (ref 0.0–40.0)

## 2018-05-01 LAB — COMPREHENSIVE METABOLIC PANEL
ALBUMIN: 4.1 g/dL (ref 3.5–5.2)
ALK PHOS: 87 U/L (ref 39–117)
ALT: 31 U/L (ref 0–53)
AST: 37 U/L (ref 0–37)
BILIRUBIN TOTAL: 0.6 mg/dL (ref 0.2–1.2)
BUN: 19 mg/dL (ref 6–23)
CO2: 33 mEq/L — ABNORMAL HIGH (ref 19–32)
CREATININE: 1.14 mg/dL (ref 0.40–1.50)
Calcium: 9.8 mg/dL (ref 8.4–10.5)
Chloride: 101 mEq/L (ref 96–112)
GFR: 69.82 mL/min (ref >60.00)
GLUCOSE: 93 mg/dL (ref 70–99)
POTASSIUM: 4.7 meq/L (ref 3.5–5.1)
Sodium: 141 mEq/L (ref 135–145)
TOTAL PROTEIN: 6.8 g/dL (ref 6.0–8.3)

## 2018-05-01 LAB — PSA: PSA: 0.93 ng/mL (ref 0.10–4.00)

## 2018-05-01 LAB — HEMOGLOBIN A1C: Hgb A1c MFr Bld: 5.7 % (ref 4.6–6.5)

## 2018-05-01 NOTE — Progress Notes (Signed)
Pre visit review using our clinic review tool, if applicable. No additional management support is needed unless otherwise documented below in the visit note. 

## 2018-05-01 NOTE — Assessment & Plan Note (Addendum)
-  Td 2013; pnm 23 , flu shot: declined again   - (+) FH CAD on ASA -[facility]: 08-2009 colonoscopy, normal, next 10 years  -prostate cancer screening: On HRT, DRE wnl today check a PSA   -labs: CMP, FLP, CBC, A1c, testosterone, TSH, PSA -Discussed healthy diet, exercise

## 2018-05-01 NOTE — Progress Notes (Signed)
Subjective:    Patient ID: James Boone, male    DOB: Oct 27, 1960, 57 y.o.   MRN: [id number]  DOS:  05/01/2018 Type of visit - description : CPX No major concerns Had shoulder surgery, recovering well   Review of Systems Lower extremity edema at baseline. Right ankle, had surgery few years ago, the angle of the joint changing gradually.  No major pain.  Other than above, a 14 point review of systems is negative    Past Medical History:  Diagnosis Date  . Abnormal liver function test   . Acquired cavovarus deformity of right foot 2016   Dr. Victorino Dike, using boot modification and [place] brace  . ADD (attention deficit disorder)   . ADD (attention deficit disorder)   . Allergic rhinitis   . Arthritis   . Back pain    lower  . Complete rotator cuff tear of left shoulder 07/12/2017  . Depression   . Hemorrhoid   . Hypercholesteremia   . Hyperglycemia   . Hyperlipidemia   . Hypertension   . Hypogonadism male   . Peripheral edema   . Sleep apnea 2005   dx in 2005 , on CPAP setting of 13  . Varicose veins    both legs    Past Surgical History:  Procedure Laterality Date  . HERNIA REPAIR  2001   abdominal hernia  . KNEE ARTHROSCOPY  05/2008   left, Dr.Geoffrey  . KNEE ARTHROSCOPY  2008   right, Dr.Geoffrey  . SHOULDER ARTHROSCOPY WITH ROTATOR CUFF REPAIR AND SUBACROMIAL DECOMPRESSION Left 07/12/2017   Procedure: LEFT SHOULDER ARTHROSCOPY DEBRIDEMENT, ACROMIOPLASTY, ROTATOR CUFF REPAIR;  Surgeon: Teryl Lucy, MD;  Location: [facility];  Service: Orthopedics;  Laterality: Left;  . TOTAL ANKLE REPLACEMENT Right 2016  . TOTAL KNEE ARTHROPLASTY Left 12/30/2012   Procedure: LEFT TOTAL KNEE ARTHROPLASTY;  Surgeon: Loanne Drilling, MD;  Location: [facility];  Service: Orthopedics;  Laterality: Left;  . TOTAL KNEE ARTHROPLASTY Right 03/16/2014   Procedure: RIGHT TOTAL KNEE ARTHROPLASTY;  Surgeon: Loanne Drilling, MD;  Location: [facility];  Service: Orthopedics;   Laterality: Right;  Marland Kitchen VASECTOMY  1990  . wisdom teeth extractiion      Social History   Socioeconomic History  . Marital status: Married    Spouse name: Not on file  . Number of children: 2  . Years of education: Not on file  . Highest education level: Not on file  Occupational History  . Occupation: [employer], sales   Social Needs  . Financial resource strain: Not on file  . Food insecurity:    Worry: Not on file    Inability: Not on file  . Transportation needs:    Medical: Not on file    Non-medical: Not on file  Tobacco Use  . Smoking status: Never Smoker  . Smokeless tobacco: Never Used  Substance and Sexual Activity  . Alcohol use: Yes    Comment: socially   . Drug use: No  . Sexual activity: Yes  Lifestyle  . Physical activity:    Days per week: Not on file    Minutes per session: Not on file  . Stress: Not on file  Relationships  . Social connections:    Talks on phone: Not on file    Gets together: Not on file    Attends religious service: Not on file    Active member of club or organization: Not on file    Attends meetings of  clubs or organizations: Not on file    Relationship status: Not on file  . Intimate partner violence:    Fear of current or ex partner: Not on file    Emotionally abused: Not on file    Physically abused: Not on file    Forced sexual activity: Not on file  Other Topics Concern  . Not on file  Social History Narrative   Lives w/ wife, 1 child and mother in law       Family History  Problem Relation Age of Onset  . Depression Sister        no suicide   . Stroke Father        ag ~ [id number]  . Coronary artery disease Father        smoker, MI and a CABG at 59 y/o  . Colon cancer Neg Hx   . Prostate cancer Neg Hx   . Diabetes Neg Hx      Allergies as of 05/01/2018   No Known Allergies     Medication List       Accurate as of May 01, 2018 11:59 PM. Always use your most recent med list.          amphetamine-dextroamphetamine 20 MG 24 hr capsule Commonly known as:  ADDERALL XR Take 20 mg by mouth 2 (two) times daily.   aspirin EC 81 MG tablet Take 81 mg by mouth daily.   atorvastatin 10 MG tablet Commonly known as:  LIPITOR Take 1 tablet (10 mg total) by mouth daily.   buPROPion 150 MG 24 hr tablet Commonly known as:  WELLBUTRIN XL Take 150 mg by mouth every morning.   lamoTRIgine 150 MG tablet Commonly known as:  LAMICTAL Take 150 mg by mouth every evening.   metoprolol tartrate 50 MG tablet Commonly known as:  LOPRESSOR Take 1 tablet (50 mg total) by mouth 2 (two) times daily.   sertraline 100 MG tablet Commonly known as:  ZOLOFT Take 100 mg by mouth every morning.   testosterone cypionate 200 MG/ML injection Commonly known as:  DEPOTESTOSTERONE CYPIONATE INJECT 1 ML INTO THE MUSCLE EVERY 14 DAYS   triamcinolone cream 0.1 % Commonly known as:  KENALOG           Objective:   Physical Exam BP (!) 142/70 (BP Location: Left Arm, Patient Position: Sitting, Cuff Size: Normal)   Pulse 66   Temp 97.9 F (36.6 C) (Oral)   Resp 16   Ht 6\' 4"  (1.93 m)   Wt (!) 322 lb 8 oz (146.3 kg)   SpO2 95%   BMI 39.26 kg/m  General: Well developed, NAD, BMI noted Neck: No  thyromegaly  HEENT:  Normocephalic . Face symmetric, atraumatic Lungs:  CTA B Normal respiratory effort, no intercostal retractions, no accessory muscle use. Heart: RRR,  no murmur.  Trace pretibial edema bilaterally  Abdomen:  Not distended, soft, non-tender. No rebound or rigidity.   Rectal: External abnormalities: none. Normal sphincter tone. No rectal masses or tenderness.  Brown stools Prostate: Prostate gland firm and smooth, no enlargement, nodularity, tenderness, mass, asymmetry or induration Skin: Hyperpigmented, dry skin on the lower extremities distal from the knees.  Symmetric. Neurologic:  alert & oriented X3.  Speech normal, gait appropriate for age and unassisted Strength  symmetric and appropriate for age.  Psych: Cognition and judgment appear intact.  Cooperative with normal attention span and concentration.  Behavior appropriate. No anxious or depressed appearing.     Assessment &  Plan:    Assessment Hyperglycemia  HTN-- lopressor Hyperlipidemia-- lipitor  Depression, ADD. Used to see Dr. Nolen Mu, now sees [facility] Elevated LFTs Edema and  Stasis dermatitis  DJD OSA -- on CPAP Morbid obesity  Hypogonadism -- f/u pcp  PLAN:  Hyperglycemia: Check a A1c. HTN: On metoprolol, BP today slightly elevated, reports normal BPs at home. Hyperlipidemia, on Lipitor, checking labs. Edema, stasis dermatitis: Recommend leg elevation when possible, keep the skin moist. DJD: Having some issues with the ankle replacement, recommend to reach out to the doctor who operated on him. Hypogonadism: On HRT, check a PSA. Morbid obesity: Information about the wellness center provided. RTC 6 months

## 2018-05-01 NOTE — Patient Instructions (Signed)
GO TO THE LAB : Get the blood work     GO TO THE FRONT DESK Schedule your next appointment for a  Check up in 6 months     Check the  blood pressure 2 or 3 times a month   Be sure your blood pressure is between 110/65 and  135/85. If it is consistently higher or lower, let me know

## 2018-05-02 MED FILL — TESTOSTERONE CYP 200 MG/ML: 200 | 84 days supply | Qty: 6 | Fill #1

## 2018-05-02 MED FILL — buPROPion HCL ER (XL) 150 M: 150 | 30 days supply | Qty: 30 | Fill #4

## 2018-05-02 MED FILL — SERTRALINE HCL 100 MG TAB: 100 | 30 days supply | Qty: 30 | Fill #0

## 2018-05-02 NOTE — Assessment & Plan Note (Signed)
Hyperglycemia: Check a A1c. HTN: On metoprolol, BP today slightly elevated, reports normal BPs at home. Hyperlipidemia, on Lipitor, checking labs. Edema, stasis dermatitis: Recommend leg elevation when possible, keep the skin moist. DJD: Having some issues with the ankle replacement, recommend to reach out to the doctor who operated on him. Hypogonadism: On HRT, check a PSA. Morbid obesity: Information about the wellness center provided. RTC 6 months

## 2018-05-03 MED FILL — ADDERALL XR 20 MG CAP SA: 20 | 30 days supply | Qty: 60 | Fill #0

## 2018-05-23 MED FILL — SUBVENITE 150 MG TABS: 150 | 30 days supply | Qty: 30 | Fill #4

## 2018-05-29 ENCOUNTER — Other Ambulatory Visit: Payer: Self-pay | Admitting: Internal Medicine

## 2018-05-29 MED FILL — METOPROLOL TARTRATE 50 MG T: 50 | 90 days supply | Qty: 180 | Fill #0

## 2018-05-31 ENCOUNTER — Ambulatory Visit: Payer: 59 | Admitting: Psychiatry

## 2018-05-31 DIAGNOSIS — F411 Generalized anxiety disorder: Secondary | ICD-10-CM

## 2018-05-31 DIAGNOSIS — F39 Unspecified mood [affective] disorder: Secondary | ICD-10-CM | POA: Diagnosis not present

## 2018-05-31 DIAGNOSIS — F988 Other specified behavioral and emotional disorders with onset usually occurring in childhood and adolescence: Secondary | ICD-10-CM | POA: Diagnosis not present

## 2018-05-31 MED ORDER — BUPROPION HCL ER (XL) 150 MG PO TB24: 150.0000 mg | ORAL_TABLET | Freq: Every morning | ORAL | 0 refills | Status: DC

## 2018-05-31 MED ORDER — LAMOTRIGINE 150 MG PO TABS: ORAL_TABLET | ORAL | 0 refills | Status: DC

## 2018-05-31 MED ORDER — SERTRALINE HCL 100 MG PO TABS: 100.0000 mg | ORAL_TABLET | Freq: Every morning | ORAL | 0 refills | Status: DC

## 2018-05-31 MED ORDER — AMPHETAMINE-DEXTROAMPHET ER 20 MG PO CP24: 20.0000 mg | ORAL_CAPSULE | Freq: Two times a day (BID) | ORAL | 0 refills | Status: DC

## 2018-05-31 MED FILL — ADDERALL XR 20 MG CAP SA: 20 | 30 days supply | Qty: 60 | Fill #0

## 2018-05-31 MED FILL — SERTRALINE HCL 100 MG TAB: 100 | 30 days supply | Qty: 30 | Fill #1

## 2018-05-31 MED FILL — buPROPion HCL ER (XL) 150 M: 150 | 30 days supply | Qty: 30 | Fill #0

## 2018-05-31 NOTE — Progress Notes (Signed)
[facility]  Patient ID: DEACAN SCHARFF,  MRN: [id number]  PCP: Wanda Plump, MD  Date of Evaluation: 05/31/2018 Time spent:20 minutes  Chief Complaint:   HISTORY/CURRENT STATUS: HPI patient was seen back in July.  Notices her mood disorder, anxiety, possible ADHD.  He was okay overall at that visit and wanted to keep the same medications.  Since then he has had increased depression 3 hours to a day  and it can happen several times a week.  Anxiety is also increased.  Can have some manic symptoms where he is goal oriented talks more decreased sleep.  These last several days. His Adderall is not strong enough.  Individual Medical History/ Review of Systems: Changes? :No   Allergies: Patient has no known allergies.  Current Medications:  Current Outpatient Medications:  .  amphetamine-dextroamphetamine (ADDERALL XR) 20 MG 24 hr capsule, Take 1 capsule (20 mg total) by mouth 2 (two) times daily., Disp: 60 capsule, Rfl: 0 .  buPROPion (WELLBUTRIN XL) 150 MG 24 hr tablet, Take 1 tablet (150 mg total) by mouth every morning., Disp: 30 tablet, Rfl: 0 .  lamoTRIgine (LAMICTAL) 150 MG tablet, 1 and 1/2 pills day, Disp: 45 tablet, Rfl: 0 .  sertraline (ZOLOFT) 100 MG tablet, Take 1 tablet (100 mg total) by mouth every morning., Disp: 30 tablet, Rfl: 0 .  aspirin EC 81 MG tablet, Take 81 mg by mouth daily., Disp: , Rfl:  .  atorvastatin (LIPITOR) 10 MG tablet, Take 1 tablet (10 mg total) by mouth daily., Disp: 90 tablet, Rfl: 2 .  metoprolol tartrate (LOPRESSOR) 50 MG tablet, Take 1 tablet (50 mg total) by mouth 2 (two) times daily. (BETA BLOCKER), Disp: 180 tablet, Rfl: 1 .  testosterone cypionate (DEPOTESTOSTERONE CYPIONATE) 200 MG/ML injection, INJECT 1 ML INTO THE MUSCLE EVERY 14 DAYS, Disp: 10 mL, Rfl: 5 .  triamcinolone cream (KENALOG) 0.1 %, , Disp: , Rfl: 0 Medication Side Effects: none  Family Medical/ Social History: Changes? No  MENTAL HEALTH EXAM:  There were no vitals  taken for this visit.There is no height or weight on file to calculate BMI.  General Appearance: Casual large man  Eye Contact:  Good  Speech:  Clear and Coherent  Volume:  Normal  Mood:  Depressed  Affect:  Appropriate and Constricted  Thought Process:  Linear  Orientation:  Full (Time, Place, and Person)  Thought Content: Logical   Suicidal Thoughts:  No  Homicidal Thoughts:  No  Memory:  WNL  Judgement:  Good  Insight:  Good  Psychomotor Activity:  Normal  Concentration:  Concentration: Good  Recall:  Good  Fund of Knowledge: Good  Language: Good  Assets:  Desire for Improvement  ADL's:  Intact  Cognition: WNL  Prognosis:  Fair    DIAGNOSES: No diagnosis found.  Receiving Psychotherapy: No    RECOMMENDATIONS: Patient has mood disorder possibly  bipolar disorder.  his son is bipolar.  He is on Lamictal 150 mg a day.  He is to increase his Lamictal to 1-1/2.  Pills a day. We will continue his Adderall XR 20 mg twice daily.  Consider increase at future visits but today I  only wanted to change 1 med(lamictal) he will continue his Wellbutrin XL 150 mg a day, continue Zoloft 100 mg a day. He is return in 1 months.  We will consider a mood diary at that time.   Anne Fu, PA-C

## 2018-06-03 ENCOUNTER — Other Ambulatory Visit: Payer: Self-pay | Admitting: Internal Medicine

## 2018-06-03 MED FILL — ATORVASTATIN 10 MG TABLET: 10 | 90 days supply | Qty: 90 | Fill #0

## 2018-06-24 MED FILL — SUBVENITE 150 MG TABS: 150 | 30 days supply | Qty: 45 | Fill #0

## 2018-06-28 ENCOUNTER — Ambulatory Visit: Payer: 59 | Admitting: Psychiatry

## 2018-07-09 MED FILL — SERTRALINE HCL 100 MG TAB: 100 | 30 days supply | Qty: 30 | Fill #2

## 2018-07-09 MED FILL — buPROPion HCL ER (XL) 150 M: 150 | 30 days supply | Qty: 30 | Fill #0

## 2018-07-11 ENCOUNTER — Ambulatory Visit (INDEPENDENT_AMBULATORY_CARE_PROVIDER_SITE_OTHER): Payer: 59 | Admitting: Psychiatry

## 2018-07-11 DIAGNOSIS — F988 Other specified behavioral and emotional disorders with onset usually occurring in childhood and adolescence: Secondary | ICD-10-CM

## 2018-07-11 DIAGNOSIS — F3289 Other specified depressive episodes: Secondary | ICD-10-CM

## 2018-07-11 DIAGNOSIS — F39 Unspecified mood [affective] disorder: Secondary | ICD-10-CM | POA: Diagnosis not present

## 2018-07-11 MED ORDER — LAMOTRIGINE 150 MG PO TABS: ORAL_TABLET | ORAL | 0 refills | Status: DC

## 2018-07-11 MED ORDER — BUPROPION HCL ER (XL) 150 MG PO TB24: 150.0000 mg | ORAL_TABLET | Freq: Every morning | ORAL | 0 refills | Status: DC

## 2018-07-11 MED ORDER — SERTRALINE HCL 100 MG PO TABS: 100.0000 mg | ORAL_TABLET | Freq: Every morning | ORAL | 0 refills | Status: DC

## 2018-07-11 MED ORDER — AMPHETAMINE-DEXTROAMPHET ER 20 MG PO CP24: 20.0000 mg | ORAL_CAPSULE | Freq: Two times a day (BID) | ORAL | 0 refills | Status: DC

## 2018-07-11 MED FILL — ADDERALL XR 20 MG CAP SA: 20 | 30 days supply | Qty: 60 | Fill #0

## 2018-07-11 NOTE — Progress Notes (Signed)
[facility]  Patient ID: James Boone,  MRN: [id number]  PCP: Wanda Plump, MD  Date of Evaluation: 07/11/2018 Time spent:20 minutes  Chief Complaint:   HISTORY/CURRENT STATUS: HPI patient seen 5 to 6 weeks ago.  Was having some depression and some manic thoughts.  We increased his Lamictal 250 mg 1-1/2 a day.  Continued Adderall XR at 20 mg at the same time.  Continue Wellbutrin 150, continue Zoloft 100 a day. He feels better overall.  Depression is about the same with no suicidal thoughts.  Manic he does have some times where he has decreased sleep several times in the last several weeks.  Anxiety is okay.  Individual Medical History/ Review of Systems: Changes? :No   Allergies: Patient has no known allergies.  Current Medications:  Current Outpatient Medications:  .  amphetamine-dextroamphetamine (ADDERALL XR) 20 MG 24 hr capsule, Take 1 capsule (20 mg total) by mouth 2 (two) times daily., Disp: 60 capsule, Rfl: 0 .  aspirin EC 81 MG tablet, Take 81 mg by mouth daily., Disp: , Rfl:  .  atorvastatin (LIPITOR) 10 MG tablet, Take 1 tablet (10 mg total) by mouth daily., Disp: 90 tablet, Rfl: 2 .  buPROPion (WELLBUTRIN XL) 150 MG 24 hr tablet, Take 1 tablet (150 mg total) by mouth every morning., Disp: 30 tablet, Rfl: 0 .  lamoTRIgine (LAMICTAL) 150 MG tablet, 1 and 1/2 pills day, Disp: 45 tablet, Rfl: 0 .  metoprolol tartrate (LOPRESSOR) 50 MG tablet, Take 1 tablet (50 mg total) by mouth 2 (two) times daily. (BETA BLOCKER), Disp: 180 tablet, Rfl: 1 .  sertraline (ZOLOFT) 100 MG tablet, Take 1 tablet (100 mg total) by mouth every morning., Disp: 30 tablet, Rfl: 0 .  testosterone cypionate (DEPOTESTOSTERONE CYPIONATE) 200 MG/ML injection, INJECT 1 ML INTO THE MUSCLE EVERY 14 DAYS, Disp: 10 mL, Rfl: 5 .  triamcinolone cream (KENALOG) 0.1 %, , Disp: , Rfl: 0 Medication Side Effects: none  Family Medical/ Social History: Changes? No  MENTAL HEALTH EXAM:  There were no vitals  taken for this visit.There is no height or weight on file to calculate BMI.  General Appearance: Casual  Eye Contact:  Good  Speech:  Clear and Coherent  Volume:  Normal  Mood:  Euthymic  Affect:  Appropriate  Thought Process:  Linear  Orientation:  Full (Time, Place, and Person)  Thought Content: Logical   Suicidal Thoughts:  No  Homicidal Thoughts:  No  Memory:  WNL  Judgement:  Good  Insight:  Good  Psychomotor Activity:  Normal  Concentration:  Concentration: Good  Recall:  Good  Fund of Knowledge: Good  Language: Good  Assets:  Desire for Improvement  ADL's:  Intact  Cognition: WNL  Prognosis:  Good    DIAGNOSES: No diagnosis found.  Receiving Psychotherapy: No    RECOMMENDATIONS: Patient wants to stay on his same medication right now.  That would be the next to 151-1/2 a day, Adderall XR 20 mg but I want him to take it twice daily instead of altogether.  Continue Wellbutrin 150 XL Zoloft 100-day.  He was also given a mood diary.  We will see him again in 1 month.   Anne Fu, PA-C

## 2018-07-29 ENCOUNTER — Other Ambulatory Visit: Payer: Self-pay | Admitting: Psychiatry

## 2018-07-29 MED FILL — SUBVENITE 150 MG TABS: 150 | 30 days supply | Qty: 45 | Fill #0

## 2018-07-29 NOTE — Telephone Encounter (Signed)
Need to review paper chart  

## 2018-08-09 ENCOUNTER — Ambulatory Visit: Payer: 59 | Admitting: Psychiatry

## 2018-08-16 MED FILL — SERTRALINE HCL 100 MG TAB: 100 | 30 days supply | Qty: 30 | Fill #3

## 2018-08-16 MED FILL — buPROPion HCL ER (XL) 150 M: 150 | 30 days supply | Qty: 30 | Fill #1

## 2018-08-19 ENCOUNTER — Telehealth: Payer: Self-pay | Admitting: Psychiatry

## 2018-08-19 ENCOUNTER — Other Ambulatory Visit: Payer: Self-pay

## 2018-08-19 MED ORDER — AMPHETAMINE-DEXTROAMPHET ER 20 MG PO CP24: 20.0000 mg | ORAL_CAPSULE | Freq: Two times a day (BID) | ORAL | 0 refills | Status: DC

## 2018-08-19 MED FILL — ADDERALL XR 20 MG CAP SA: 20 | 30 days supply | Qty: 60 | Fill #0

## 2018-08-19 NOTE — Telephone Encounter (Signed)
Submitted to provider for approval 

## 2018-08-19 NOTE — Telephone Encounter (Signed)
[name] called to reschedule his appt with Mat Carne that we had to cancel.  He is rescheduled for 09/17/18 but needs refill on his Adderall.  Please send to [facility]

## 2018-08-29 ENCOUNTER — Telehealth: Payer: Self-pay | Admitting: Internal Medicine

## 2018-08-29 MED FILL — METOPROLOL TARTRATE 50 MG T: 50 | 90 days supply | Qty: 180 | Fill #1

## 2018-09-02 MED FILL — SUBVENITE 150 MG TABS: 150 | 30 days supply | Qty: 45 | Fill #0

## 2018-09-02 MED FILL — TESTOSTERONE CYP 200 MG/ML: 200 | 84 days supply | Qty: 6 | Fill #0

## 2018-09-02 NOTE — Telephone Encounter (Signed)
Sent!

## 2018-09-02 NOTE — Telephone Encounter (Signed)
Pt is requesting refill on testosterone.   Last OV: 05/01/2018 Last Fill: 01/12/2018 #61mL and 5RF

## 2018-09-12 ENCOUNTER — Other Ambulatory Visit: Payer: Self-pay | Admitting: Psychiatry

## 2018-09-12 MED FILL — buPROPion HCL ER (XL) 150 M: 150 | 30 days supply | Qty: 30 | Fill #0

## 2018-09-12 MED FILL — SERTRALINE HCL 100 MG TAB: 100 | 30 days supply | Qty: 30 | Fill #4

## 2018-09-13 ENCOUNTER — Other Ambulatory Visit: Payer: Self-pay | Admitting: Internal Medicine

## 2018-09-13 MED FILL — BD 3 ML SYRINGE WITH NEEDLE: 23G X 1-1/2 | 26 days supply | Qty: 30 | Fill #0

## 2018-09-16 MED FILL — ATORVASTATIN 10 MG TABLET: 10 | 90 days supply | Qty: 90 | Fill #1

## 2018-09-17 ENCOUNTER — Ambulatory Visit: Payer: 59 | Admitting: Psychiatry

## 2018-09-17 ENCOUNTER — Other Ambulatory Visit: Payer: Self-pay

## 2018-09-17 ENCOUNTER — Telehealth: Payer: Self-pay | Admitting: Psychiatry

## 2018-09-17 MED ORDER — AMPHETAMINE-DEXTROAMPHET ER 20 MG PO CP24: 20.0000 mg | ORAL_CAPSULE | Freq: Two times a day (BID) | ORAL | 0 refills | Status: DC

## 2018-09-17 MED FILL — ADDERALL XR 20 MG CAP SA: 20 | 30 days supply | Qty: 60 | Fill #0

## 2018-09-17 NOTE — Telephone Encounter (Signed)
Patient need refill on Adderall XR to be sent to [facility]

## 2018-09-17 NOTE — Telephone Encounter (Signed)
Submitted

## 2018-10-06 MED FILL — buPROPion HCL ER (XL) 150 M: 150 | 30 days supply | Qty: 30 | Fill #0

## 2018-10-08 ENCOUNTER — Other Ambulatory Visit: Payer: Self-pay

## 2018-10-08 MED ORDER — LAMOTRIGINE 150 MG PO TABS: 225.0000 mg | ORAL_TABLET | Freq: Every day | ORAL | 0 refills | Status: DC

## 2018-10-08 MED FILL — SUBVENITE 150 MG TABS: 150 | 30 days supply | Qty: 45 | Fill #0

## 2018-10-11 ENCOUNTER — Ambulatory Visit (INDEPENDENT_AMBULATORY_CARE_PROVIDER_SITE_OTHER): Payer: 59 | Admitting: Psychiatry

## 2018-10-11 ENCOUNTER — Other Ambulatory Visit: Payer: Self-pay

## 2018-10-11 ENCOUNTER — Encounter: Payer: Self-pay | Admitting: Psychiatry

## 2018-10-11 DIAGNOSIS — F3181 Bipolar II disorder: Secondary | ICD-10-CM

## 2018-10-11 DIAGNOSIS — F988 Other specified behavioral and emotional disorders with onset usually occurring in childhood and adolescence: Secondary | ICD-10-CM

## 2018-10-11 MED ORDER — AMPHETAMINE-DEXTROAMPHET ER 20 MG PO CP24: 20.0000 mg | ORAL_CAPSULE | Freq: Two times a day (BID) | ORAL | 0 refills | Status: DC

## 2018-10-11 MED FILL — ADDERALL XR 20 MG CAP SA: 20 | 90 days supply | Qty: 180 | Fill #0

## 2018-10-11 NOTE — Progress Notes (Signed)
James Boone [id number] Jan 16, 1961 58 y.o.  Virtual Visit via Telephone Note  I connected with@ on 10/11/18 at 10:30 AM EDT by telephone and verified that I am speaking with the correct person using two identifiers.   I discussed the limitations, risks, security and privacy concerns of performing an evaluation and management service by telephone and the availability of in person appointments. I also discussed with the patient that there may be a patient responsible charge related to this service. The patient expressed understanding and agreed to proceed.   I discussed the assessment and treatment plan with the patient. The patient was provided an opportunity to ask questions and all were answered. The patient agreed with the plan and demonstrated an understanding of the instructions.   The patient was advised to call back or seek an in-person evaluation if the symptoms worsen or if the condition fails to improve as anticipated.  I provided 20 minutes of non-face-to-face time during this encounter.  The patient was located at home.  The provider was located at home.   Lauraine Rinne, MD   Subjective:   Patient ID:  James Boone is a 58 y.o. (DOB 05-25-1960) male.  Chief Complaint:  Chief Complaint  Patient presents with  . Depression    Medication Management  . Follow-up    Medication Management  . Anxiety    Medication Management    HPI James Boone presents for follow-up of ADD and bipolar disorder.  Last visit was July 11, 2018.  No meds were changed.  It looks like he is remained on the same med regimen for several years.  These are the highest dosage of sertraline and Wellbutrin.  No major concerns re: mental health nor meds. Pt reports that mood is Negative and describes anxiety as Minimal. Anxiety symptoms include: Social Anxiety,. Pt reports some irregularity with 4-5 hours typical for years.  Occ drowsy.  . Pt reports that appetite is good. Pt reports that energy  is good and good. Concentration is down slightly. Suicidal thoughts:  denied by patient.  No suicide attempts.  Denies mood swings lately.  Last sign depression usually situational and doesn't last long.  Works [employer].  Past Psychiatric Medication Trials: Adderall, sertraline, Wellbutrin, lamotrigine  Review of Systems:  Review of Systems  Musculoskeletal: Positive for arthralgias.  Neurological: Negative for tremors and weakness.    Medications: I have reviewed the patient's current medications.  Current Outpatient Medications  Medication Sig Dispense Refill  . amphetamine-dextroamphetamine (ADDERALL XR) 20 MG 24 hr capsule Take 1 capsule (20 mg total) by mouth 2 (two) times daily. 180 capsule 0  . aspirin EC 81 MG tablet Take 81 mg by mouth daily.    Marland Kitchen atorvastatin (LIPITOR) 10 MG tablet Take 1 tablet (10 mg total) by mouth daily. 90 tablet 2  . B-D 3CC LUER-LOK SYR 23GX1-1/2 23G X 1-1/2" 3 ML MISC USE AS DIRECTED FOR INTRAMUSCULAR TESTOSTERONE INJECTIONS 100 each 11  . buPROPion (WELLBUTRIN XL) 150 MG 24 hr tablet TAKE 1 TABLET BY MOUTH ONCE DAILY 30 tablet 2  . lamoTRIgine (SUBVENITE) 150 MG tablet Take 1.5 tablets (225 mg total) by mouth daily. 45 tablet 0  . metoprolol tartrate (LOPRESSOR) 50 MG tablet Take 1 tablet (50 mg total) by mouth 2 (two) times daily. (BETA BLOCKER) 180 tablet 1  . sertraline (ZOLOFT) 100 MG tablet Take 1 tablet (100 mg total) by mouth every morning. 30 tablet 0  . testosterone cypionate (  DEPOTESTOSTERONE CYPIONATE) 200 MG/ML injection INJECT 1 ML INTO THE MUSCLE EVERY 14 DAYS 10 mL 1  . triamcinolone cream (KENALOG) 0.1 %   0   No current facility-administered medications for this visit.     Medication Side Effects: None except dry  Allergies: No Known Allergies  Past Medical History:  Diagnosis Date  . Abnormal liver function test   . Acquired cavovarus deformity of right foot 2016   Dr. Victorino Dike, using boot modification and [place]  brace  . ADD (attention deficit disorder)   . ADD (attention deficit disorder)   . Allergic rhinitis   . Arthritis   . Back pain    lower  . Complete rotator cuff tear of left shoulder 07/12/2017  . Depression   . Hemorrhoid   . Hypercholesteremia   . Hyperglycemia   . Hyperlipidemia   . Hypertension   . Hypogonadism male   . Peripheral edema   . Sleep apnea 2005   dx in 2005 , on CPAP setting of 13  . Varicose veins    both legs    Family History  Problem Relation Age of Onset  . Depression Sister        no suicide   . Stroke Father        ag ~ 77  . Coronary artery disease Father        smoker, MI and a CABG at 45 y/o  . Colon cancer Neg Hx   . Prostate cancer Neg Hx   . Diabetes Neg Hx     Social History   Socioeconomic History  . Marital status: Married    Spouse name: Not on file  . Number of children: 2  . Years of education: Not on file  . Highest education level: Not on file  Occupational History  . Occupation: [employer], sales   Social Needs  . Financial resource strain: Not on file  . Food insecurity:    Worry: Not on file    Inability: Not on file  . Transportation needs:    Medical: Not on file    Non-medical: Not on file  Tobacco Use  . Smoking status: Never Smoker  . Smokeless tobacco: Never Used  Substance and Sexual Activity  . Alcohol use: Yes    Comment: socially   . Drug use: No  . Sexual activity: Yes  Lifestyle  . Physical activity:    Days per week: Not on file    Minutes per session: Not on file  . Stress: Not on file  Relationships  . Social connections:    Talks on phone: Not on file    Gets together: Not on file    Attends religious service: Not on file    Active member of club or organization: Not on file    Attends meetings of clubs or organizations: Not on file    Relationship status: Not on file  . Intimate partner violence:    Fear of current or ex partner: Not on file    Emotionally abused: Not on file     Physically abused: Not on file    Forced sexual activity: Not on file  Other Topics Concern  . Not on file  Social History Narrative   Lives w/ wife, 1 child and mother in law        Past Medical History, Surgical history, Social history, and Family history were reviewed and updated as appropriate.   Please see review of systems for  further details on the patient's review from today.   Objective:   Physical Exam:  There were no vitals taken for this visit.  Physical Exam Neurological:     Mental Status: He is alert and oriented to person, place, and time.     Cranial Nerves: No dysarthria.  Psychiatric:        Attention and Perception: Attention normal.        Mood and Affect: Mood is anxious. Mood is not depressed. Affect is blunt.        Speech: Speech normal.        Behavior: Behavior is cooperative.        Thought Content: Thought content normal. Thought content is not paranoid or delusional. Thought content does not include homicidal or suicidal ideation. Thought content does not include homicidal or suicidal plan.        Cognition and Memory: Cognition and memory normal.        Judgment: Judgment normal.     Lab Review:     Component Value Date/Time   NA 141 05/01/2018 1358   K 4.7 05/01/2018 1358   CL 101 05/01/2018 1358   CO2 33 (H) 05/01/2018 1358   GLUCOSE 93 05/01/2018 1358   BUN 19 05/01/2018 1358   CREATININE 1.14 05/01/2018 1358   CALCIUM 9.8 05/01/2018 1358   PROT 6.8 05/01/2018 1358   ALBUMIN 4.1 05/01/2018 1358   AST 37 05/01/2018 1358   ALT 31 05/01/2018 1358   ALKPHOS 87 05/01/2018 1358   BILITOT 0.6 05/01/2018 1358   GFRNONAA >90 03/18/2014 0125   GFRAA >90 03/18/2014 0125       Component Value Date/Time   WBC 6.0 05/01/2018 1358   RBC 5.22 05/01/2018 1358   HGB 15.2 05/01/2018 1358   HCT 45.8 05/01/2018 1358   PLT 239.0 05/01/2018 1358   MCV 87.9 05/01/2018 1358   MCH 28.7 03/18/2014 0125   MCHC 33.1 05/01/2018 1358   RDW 15.9  (H) 05/01/2018 1358   LYMPHSABS 1.7 05/01/2018 1358   MONOABS 0.7 05/01/2018 1358   EOSABS 0.3 05/01/2018 1358   BASOSABS 0.0 05/01/2018 1358    No results found for: POCLITH, LITHIUM   No results found for: PHENYTOIN, PHENOBARB, VALPROATE, CBMZ   .res Assessment: Plan:    Bipolar II disorder (HCC)  Attention deficit disorder (ADD) without hyperactivity - Plan: amphetamine-dextroamphetamine (ADDERALL XR) 20 MG 24 hr capsule  Per Dr. Christena DeemMitchem's note's patient likely has Asberger's syndrome plus or minus social anxiety as well.  The patient is satisfied with his current medications.  His answers were brief to questions but he denied most psychiatric symptoms except for some situational anxiety and depressive symptoms.  The primary stressors are probably financial at this point.  Overall he is satisfied with his medication and does not want any medication changes.  Discussed potential benefits, risks, and side effects of stimulants with patient to include increased heart rate, palpitations, insomnia, increased anxiety, increased irritability, or decreased appetite.  Instructed patient to contact office if experiencing any significant tolerability issues.  He feels he is getting adequate duration out of the Adderall XR taking 2 each morning.  He does not have a significant crash.  No particular mood swings were noted.  He was cautioned that both sertraline and stimulants can cause mood swings and bipolar patients and to let us know if those occur.  No med changes today  Follow-up 4 months  Meredith Staggersarey Cottle MD, DFAPA  Please see After Visit  Summary for patient specific instructions.  Future Appointments  Date Time Provider Department Center  10/30/2018  1:00 PM Wanda Plump, MD [facility]    No orders of the defined types were placed in this encounter.     -------------------------------

## 2018-10-28 ENCOUNTER — Other Ambulatory Visit: Payer: Self-pay | Admitting: Psychiatry

## 2018-10-30 ENCOUNTER — Ambulatory Visit: Payer: 59 | Admitting: Internal Medicine

## 2018-11-01 MED FILL — SUBVENITE 150 MG TABS: 150 | 30 days supply | Qty: 45 | Fill #0

## 2018-11-04 MED FILL — SERTRALINE HCL 100 MG TAB: 100 | 30 days supply | Qty: 30 | Fill #0

## 2018-11-07 ENCOUNTER — Ambulatory Visit (INDEPENDENT_AMBULATORY_CARE_PROVIDER_SITE_OTHER): Payer: 59 | Admitting: Internal Medicine

## 2018-11-07 ENCOUNTER — Other Ambulatory Visit: Payer: Self-pay

## 2018-11-07 ENCOUNTER — Telehealth: Payer: Self-pay | Admitting: Internal Medicine

## 2018-11-07 DIAGNOSIS — R609 Edema, unspecified: Secondary | ICD-10-CM

## 2018-11-07 DIAGNOSIS — E291 Testicular hypofunction: Secondary | ICD-10-CM

## 2018-11-07 DIAGNOSIS — I10 Essential (primary) hypertension: Secondary | ICD-10-CM | POA: Diagnosis not present

## 2018-11-07 DIAGNOSIS — Z09 Encounter for follow-up examination after completed treatment for conditions other than malignant neoplasm: Secondary | ICD-10-CM

## 2018-11-07 NOTE — Progress Notes (Signed)
Subjective:    Patient ID: James Boone, male    DOB: 01/16/1961, 58 y.o.   MRN: [id number]  DOS:  11/07/2018 Type of visit - description: Virtual Visit via Video Note  I connected with@ on 11/08/18 at  8:20 AM EDT by a video enabled telemedicine application and verified that I am speaking with the correct person using two identifiers.   THIS ENCOUNTER IS A VIRTUAL VISIT DUE TO COVID-19 - PATIENT WAS NOT SEEN IN THE OFFICE. PATIENT HAS CONSENTED TO VIRTUAL VISIT / TELEMEDICINE VISIT   Location of patient: home  Location of provider: office  I discussed the limitations of evaluation and management by telemedicine and the availability of in person appointments. The patient expressed understanding and agreed to proceed.  History of Present Illness: Routine office visit We review his medications & last lab results together. He has actually no major concerns. Continue working, schedule changes weekly.  When he is at work he is very active on his feet.    BP Readings from Last 3 Encounters:  05/01/18 (!) 142/70  08/10/17 136/68  07/12/17 110/66     Review of Systems  No major concerns except that continue with lower extremity edema, calves are symmetric and not tender. Skin gets irritated from time to time but denies redness or warmness.  Past Medical History:  Diagnosis Date  . Abnormal liver function test   . Acquired cavovarus deformity of right foot 2016   Dr. Victorino DikeHewitt, using boot modification and [place] brace  . ADD (attention deficit disorder)   . ADD (attention deficit disorder)   . Allergic rhinitis   . Arthritis   . Back pain    lower  . Complete rotator cuff tear of left shoulder 07/12/2017  . Depression   . Hemorrhoid   . Hypercholesteremia   . Hyperglycemia   . Hyperlipidemia   . Hypertension   . Hypogonadism male   . Peripheral edema   . Sleep apnea 2005   dx in 2005 , on CPAP setting of 13  . Varicose veins    both legs    Past Surgical History:   Procedure Laterality Date  . HERNIA REPAIR  2001   abdominal hernia  . KNEE ARTHROSCOPY  05/2008   left, Dr.Geoffrey  . KNEE ARTHROSCOPY  2008   right, Dr.Geoffrey  . SHOULDER ARTHROSCOPY WITH ROTATOR CUFF REPAIR AND SUBACROMIAL DECOMPRESSION Left 07/12/2017   Procedure: LEFT SHOULDER ARTHROSCOPY DEBRIDEMENT, ACROMIOPLASTY, ROTATOR CUFF REPAIR;  Surgeon: Teryl LucyLandau, Joshua, MD;  Location: [facility];  Service: Orthopedics;  Laterality: Left;  . TOTAL ANKLE REPLACEMENT Right 2016  . TOTAL KNEE ARTHROPLASTY Left 12/30/2012   Procedure: LEFT TOTAL KNEE ARTHROPLASTY;  Surgeon: Loanne DrillingFrank V Aluisio, MD;  Location: [facility];  Service: Orthopedics;  Laterality: Left;  . TOTAL KNEE ARTHROPLASTY Right 03/16/2014   Procedure: RIGHT TOTAL KNEE ARTHROPLASTY;  Surgeon: Loanne DrillingFrank Aluisio V, MD;  Location: [facility];  Service: Orthopedics;  Laterality: Right;  Marland Kitchen. VASECTOMY  1990  . wisdom teeth extractiion      Social History   Socioeconomic History  . Marital status: Married    Spouse name: Not on file  . Number of children: 2  . Years of education: Not on file  . Highest education level: Not on file  Occupational History  . Occupation: [employer], sales   Social Needs  . Financial resource strain: Not on file  . Food insecurity    Worry: Not on file  Inability: Not on file  . Transportation needs    Medical: Not on file    Non-medical: Not on file  Tobacco Use  . Smoking status: Never Smoker  . Smokeless tobacco: Never Used  Substance and Sexual Activity  . Alcohol use: Yes    Comment: socially   . Drug use: No  . Sexual activity: Yes  Lifestyle  . Physical activity    Days per week: Not on file    Minutes per session: Not on file  . Stress: Not on file  Relationships  . Social Musicianconnections    Talks on phone: Not on file    Gets together: Not on file    Attends religious service: Not on file    Active member of club or organization: Not on file    Attends meetings of  clubs or organizations: Not on file    Relationship status: Not on file  . Intimate partner violence    Fear of current or ex partner: Not on file    Emotionally abused: Not on file    Physically abused: Not on file    Forced sexual activity: Not on file  Other Topics Concern  . Not on file  Social History Narrative   Lives w/ wife, 1 child and mother in law          Allergies as of 11/07/2018   No Known Allergies     Medication List       Accurate as of November 07, 2018 11:59 PM. If you have any questions, ask your nurse or doctor.        amphetamine-dextroamphetamine 20 MG 24 hr capsule Commonly known as: ADDERALL XR Take 1 capsule (20 mg total) by mouth 2 (two) times daily.   aspirin EC 81 MG tablet Take 81 mg by mouth daily.   atorvastatin 10 MG tablet Commonly known as: LIPITOR Take 1 tablet (10 mg total) by mouth daily.   B-D 3CC LUER-LOK SYR 23GX1-1/2 23G X 1-1/2" 3 ML Misc Generic drug: SYRINGE-NEEDLE (DISP) 3 ML USE AS DIRECTED FOR INTRAMUSCULAR TESTOSTERONE INJECTIONS   buPROPion 150 MG 24 hr tablet Commonly known as: WELLBUTRIN XL TAKE 1 TABLET BY MOUTH ONCE DAILY   metoprolol tartrate 50 MG tablet Commonly known as: LOPRESSOR Take 1 tablet (50 mg total) by mouth 2 (two) times daily. (BETA BLOCKER)   sertraline 100 MG tablet Commonly known as: ZOLOFT Take 1 tablet (100 mg total) by mouth every morning.   Subvenite 150 MG tablet Generic drug: lamoTRIgine TAKE 1 & 1/2 TABLETS BY MOUTH DAILY.   testosterone cypionate 200 MG/ML injection Commonly known as: DEPOTESTOSTERONE CYPIONATE INJECT 1 ML INTO THE MUSCLE EVERY 14 DAYS   triamcinolone cream 0.1 % Commonly known as: KENALOG           Objective:   Physical Exam There were no vitals taken for this visit. This is a virtual video visit, alert oriented x3, no apparent distress. No ambulatory BPs. Weight at home has decreased 2 pounds per his scales.     Assessment      Assessment  Hyperglycemia  HTN-- lopressor Hyperlipidemia-- lipitor  Depression, ADD. Used to see Dr. Nolen MuMcKinney, now sees [facility] L[facility] Edema and  Stasis dermatitis  DJD OSA -- on CPAP Morbid obesity  Hypogonadism -- f/u pcp  PLAN:  HTN: On metoprolol, recommend ambulatory BPs which he is not doing at the present time Edema, stasis dermatitis: We again review the importance of leg elevation and  low-salt intake.  He has a hard time using compression stockings.  Recommend to continue monitoring the skin and let me know if he has problems. Hypogonadism: We missed his testosterone levels the last time we draw blood.  Will arrange Plan: Labs in few days, RTC 3 months    I discussed the assessment and treatment plan with the patient. The patient was provided an opportunity to ask questions and all were answered. The patient agreed with the plan and demonstrated an understanding of the instructions.   The patient was advised to call back or seek an in-person evaluation if the symptoms worsen or if the condition fails to improve as anticipated.

## 2018-11-07 NOTE — Telephone Encounter (Signed)
Return in about 3 months (around 02/07/2019) for routine visit , 20 minutes, fasting optional. Left message for pt to call back

## 2018-11-08 NOTE — Assessment & Plan Note (Signed)
HTN: On metoprolol, recommend ambulatory BPs which he is not doing at the present time Edema, stasis dermatitis: We again review the importance of leg elevation and low-salt intake.  He has a hard time using compression stockings.  Recommend to continue monitoring the skin and let me know if he has problems. Hypogonadism: We missed his testosterone levels the last time we draw blood.  Will arrange Plan: Labs in few days, RTC 3 months

## 2018-11-18 MED FILL — buPROPion HCL ER (XL) 150 M: 150 | 30 days supply | Qty: 30 | Fill #1

## 2018-11-25 ENCOUNTER — Encounter: Payer: Self-pay | Admitting: Internal Medicine

## 2018-11-25 ENCOUNTER — Other Ambulatory Visit: Payer: Self-pay

## 2018-11-25 ENCOUNTER — Ambulatory Visit: Payer: 59 | Admitting: Internal Medicine

## 2018-11-25 VITALS — BP 132/78 | HR 68 | Temp 98.4°F | Resp 16 | Ht 76.0 in | Wt 307.0 lb

## 2018-11-25 DIAGNOSIS — I872 Venous insufficiency (chronic) (peripheral): Secondary | ICD-10-CM

## 2018-11-25 MED ORDER — CEPHALEXIN 500 MG PO CAPS: 500.0000 mg | ORAL_CAPSULE | Freq: Four times a day (QID) | ORAL | 0 refills | Status: DC

## 2018-11-25 MED ORDER — COLLAGENASE 250 UNIT/GM EX OINT: 1.0000 "application " | TOPICAL_OINTMENT | Freq: Every day | CUTANEOUS | 0 refills | Status: DC

## 2018-11-25 MED FILL — SANTYL OINTMENT: 250 | 30 days supply | Qty: 30 | Fill #0

## 2018-11-25 MED FILL — CEPHALEXIN 500 MG CAPSULE: 500 | 5 days supply | Qty: 20 | Fill #0

## 2018-11-25 NOTE — Progress Notes (Signed)
Subjective:    Patient ID: James Boone, male    DOB: 03/20/1961, 58 y.o.   MRN: [id number]  DOS:  11/25/2018 Type of visit - description: Acute visit Symptoms started approximately 2 weeks ago when he noted some bruising for the left pretibial area and later on the skin starting to peel off. he has been using OTC antibiotic cream & keeping the area covered.   Review of Systems Denies fever chills Swelling of the lower extremities is essentially at baseline.  Past Medical History:  Diagnosis Date  . Abnormal liver function test   . Acquired cavovarus deformity of right foot 2016   Dr. Victorino DikeHewitt, using boot modification and [place] brace  . ADD (attention deficit disorder)   . ADD (attention deficit disorder)   . Allergic rhinitis   . Arthritis   . Back pain    lower  . Complete rotator cuff tear of left shoulder 07/12/2017  . Depression   . Hemorrhoid   . Hypercholesteremia   . Hyperglycemia   . Hyperlipidemia   . Hypertension   . Hypogonadism male   . Peripheral edema   . Sleep apnea 2005   dx in 2005 , on CPAP setting of 13  . Varicose veins    both legs    Past Surgical History:  Procedure Laterality Date  . HERNIA REPAIR  2001   abdominal hernia  . KNEE ARTHROSCOPY  05/2008   left, Dr.Geoffrey  . KNEE ARTHROSCOPY  2008   right, Dr.Geoffrey  . SHOULDER ARTHROSCOPY WITH ROTATOR CUFF REPAIR AND SUBACROMIAL DECOMPRESSION Left 07/12/2017   Procedure: LEFT SHOULDER ARTHROSCOPY DEBRIDEMENT, ACROMIOPLASTY, ROTATOR CUFF REPAIR;  Surgeon: Teryl LucyLandau, Joshua, MD;  Location: [facility];  Service: Orthopedics;  Laterality: Left;  . TOTAL ANKLE REPLACEMENT Right 2016  . TOTAL KNEE ARTHROPLASTY Left 12/30/2012   Procedure: LEFT TOTAL KNEE ARTHROPLASTY;  Surgeon: Loanne DrillingFrank V Aluisio, MD;  Location: [facility];  Service: Orthopedics;  Laterality: Left;  . TOTAL KNEE ARTHROPLASTY Right 03/16/2014   Procedure: RIGHT TOTAL KNEE ARTHROPLASTY;  Surgeon: Loanne DrillingFrank Aluisio V, MD;  Location:  [facility];  Service: Orthopedics;  Laterality: Right;  Marland Kitchen. VASECTOMY  1990  . wisdom teeth extractiion      Social History   Socioeconomic History  . Marital status: Married    Spouse name: Not on file  . Number of children: 2  . Years of education: Not on file  . Highest education level: Not on file  Occupational History  . Occupation: [employer], sales   Social Needs  . Financial resource strain: Not on file  . Food insecurity    Worry: Not on file    Inability: Not on file  . Transportation needs    Medical: Not on file    Non-medical: Not on file  Tobacco Use  . Smoking status: Never Smoker  . Smokeless tobacco: Never Used  Substance and Sexual Activity  . Alcohol use: Yes    Comment: socially   . Drug use: No  . Sexual activity: Yes  Lifestyle  . Physical activity    Days per week: Not on file    Minutes per session: Not on file  . Stress: Not on file  Relationships  . Social Musicianconnections    Talks on phone: Not on file    Gets together: Not on file    Attends religious service: Not on file    Active member of club or organization: Not on file  Attends meetings of clubs or organizations: Not on file    Relationship status: Not on file  . Intimate partner violence    Fear of current or ex partner: Not on file    Emotionally abused: Not on file    Physically abused: Not on file    Forced sexual activity: Not on file  Other Topics Concern  . Not on file  Social History Narrative   Lives w/ wife, 1 child and mother in law          Allergies as of 11/25/2018   No Known Allergies     Medication List       Accurate as of November 25, 2018  8:36 PM. If you have any questions, ask your nurse or doctor.        amphetamine-dextroamphetamine 20 MG 24 hr capsule Commonly known as: ADDERALL XR Take 1 capsule (20 mg total) by mouth 2 (two) times daily.   aspirin EC 81 MG tablet Take 81 mg by mouth daily.   atorvastatin 10 MG tablet Commonly known as:  LIPITOR Take 1 tablet (10 mg total) by mouth daily.   B-D 3CC LUER-LOK SYR 23GX1-1/2 23G X 1-1/2" 3 ML Misc Generic drug: SYRINGE-NEEDLE (DISP) 3 ML USE AS DIRECTED FOR INTRAMUSCULAR TESTOSTERONE INJECTIONS   buPROPion 150 MG 24 hr tablet Commonly known as: WELLBUTRIN XL TAKE 1 TABLET BY MOUTH ONCE DAILY   cephALEXin 500 MG capsule Commonly known as: KEFLEX Take 1 capsule (500 mg total) by mouth 4 (four) times daily. Started by: Willow OraJose Raysa Bosak, MD   collagenase ointment Commonly known as: SANTYL Apply 1 application topically daily. Started by: Willow OraJose Suprena Travaglini, MD   metoprolol tartrate 50 MG tablet Commonly known as: LOPRESSOR Take 1 tablet (50 mg total) by mouth 2 (two) times daily. (BETA BLOCKER)   sertraline 100 MG tablet Commonly known as: ZOLOFT Take 1 tablet (100 mg total) by mouth every morning.   Subvenite 150 MG tablet Generic drug: lamoTRIgine TAKE 1 & 1/2 TABLETS BY MOUTH DAILY.   testosterone cypionate 200 MG/ML injection Commonly known as: DEPOTESTOSTERONE CYPIONATE INJECT 1 ML INTO THE MUSCLE EVERY 14 DAYS   triamcinolone cream 0.1 % Commonly known as: KENALOG           Objective:   Physical Exam BP 132/78 (BP Location: Left Arm, Patient Position: Sitting, Cuff Size: Normal)   Pulse 68   Temp 98.4 F (36.9 C) (Oral)   Resp 16   Ht 6\' 4"  (1.93 m)   Wt (!) 307 lb (139.3 kg)   SpO2 97%   BMI 37.37 kg/m  General:   Well developed, NAD, BMI noted. HEENT:  Normocephalic . Face symmetric, atraumatic Lower extremities: Calves symmetric and no TTP Skin: Chronic changes noted on the right leg, at baseline. Left leg: There is an area of superficial irritation with mild oozing of serous fluids,  see pictures,  neurologic:  alert & oriented X3.  Speech normal, gait appropriate for age and unassisted Psych--  Cognition and judgment appear intact.  Cooperative with normal attention span and concentration.  Behavior appropriate. No anxious or depressed  appearing.          Assessment     Assessment Hyperglycemia  HTN-- lopressor Hyperlipidemia-- lipitor  Depression, ADD. Used to see Dr. Nolen MuMcKinney, now sees [facility] Edema and  Stasis dermatitis  DJD OSA -- on CPAP Morbid obesity  Hypogonadism -- f/u pcp  PLAN:  Stasis  Dermatitis: Chronic skin changes now with  superficial, left-sided, pretibial  denudation.  Possibly early infection. Recommend leg elevation, Keflex  , Santyl daily, frequent showering, call if not better, follow-up in 10 days. Offered a work note so he can keep the leg elevated, declined.  Area was redressed by my nurse after I examined it.

## 2018-11-25 NOTE — Assessment & Plan Note (Signed)
Stasis  Dermatitis: Chronic skin changes now with superficial, left-sided, pretibial  denudation.  Possibly early infection. Recommend leg elevation, Keflex  , Santyl daily, frequent showering, call if not better, follow-up in 10 days. Offered a work note so he can keep the leg elevated, declined.  Area was redressed by my nurse after I examined it.

## 2018-11-25 NOTE — Progress Notes (Signed)
Pre visit review using our clinic review tool, if applicable. No additional management support is needed unless otherwise documented below in the visit note. 

## 2018-11-25 NOTE — Patient Instructions (Addendum)
Cancel the appointment you have for blood work 12/03/2018  Schedule a follow-up to see me 10 days from today  Strict  leg elevation for 3 to 4 days  Keep taking showers once or twice a day  Keep the area clean and dry  Apply Santyl (ointment) once or twice a day  Take Keflex antibiotic  Call if not gradually better.  Call anytime if fever, chills, redness, swelling of the left leg.

## 2018-12-03 ENCOUNTER — Other Ambulatory Visit: Payer: 59

## 2018-12-05 ENCOUNTER — Other Ambulatory Visit: Payer: Self-pay

## 2018-12-05 ENCOUNTER — Encounter: Payer: Self-pay | Admitting: Internal Medicine

## 2018-12-05 ENCOUNTER — Ambulatory Visit: Payer: 59 | Admitting: Internal Medicine

## 2018-12-05 ENCOUNTER — Other Ambulatory Visit: Payer: Self-pay | Admitting: Internal Medicine

## 2018-12-05 VITALS — BP 138/62 | HR 62 | Temp 98.2°F | Resp 16 | Ht 76.0 in | Wt 301.0 lb

## 2018-12-05 DIAGNOSIS — I872 Venous insufficiency (chronic) (peripheral): Secondary | ICD-10-CM

## 2018-12-05 MED FILL — SERTRALINE HCL 100 MG TAB: 100 | 30 days supply | Qty: 30 | Fill #0

## 2018-12-05 MED FILL — METOPROLOL TARTRATE 50 MG T: 50 | 90 days supply | Qty: 180 | Fill #0

## 2018-12-05 NOTE — Progress Notes (Signed)
Subjective:    Patient ID: James Boone, male    DOB: 1961/01/05, 58 y.o.   MRN: [id number]  DOS:  12/05/2018 Type of visit - description: Follow-up from last visit  (stasis dermatitis) Patient about to finish with Keflex Trying to keep the leg elevated the best he can. It looks better to him.  Review of Systems Denies fever chills No discharge No pain or itching at the area   Past Medical History:  Diagnosis Date  . Abnormal liver function test   . Acquired cavovarus deformity of right foot 2016   Dr. Doran Durand, using boot modification and [place] brace  . ADD (attention deficit disorder)   . ADD (attention deficit disorder)   . Allergic rhinitis   . Arthritis   . Back pain    lower  . Complete rotator cuff tear of left shoulder 07/12/2017  . Depression   . Hemorrhoid   . Hypercholesteremia   . Hyperglycemia   . Hyperlipidemia   . Hypertension   . Hypogonadism male   . Peripheral edema   . Sleep apnea 2005   dx in 2005 , on CPAP setting of 13  . Varicose veins    both legs    Past Surgical History:  Procedure Laterality Date  . HERNIA REPAIR  2001   abdominal hernia  . KNEE ARTHROSCOPY  05/2008   left, Dr.Geoffrey  . KNEE ARTHROSCOPY  2008   right, Dr.Geoffrey  . SHOULDER ARTHROSCOPY WITH ROTATOR CUFF REPAIR AND SUBACROMIAL DECOMPRESSION Left 07/12/2017   Procedure: LEFT SHOULDER ARTHROSCOPY DEBRIDEMENT, ACROMIOPLASTY, ROTATOR CUFF REPAIR;  Surgeon: Marchia Bond, MD;  Location: [facility];  Service: Orthopedics;  Laterality: Left;  . TOTAL ANKLE REPLACEMENT Right 2016  . TOTAL KNEE ARTHROPLASTY Left 12/30/2012   Procedure: LEFT TOTAL KNEE ARTHROPLASTY;  Surgeon: Gearlean Alf, MD;  Location: [facility];  Service: Orthopedics;  Laterality: Left;  . TOTAL KNEE ARTHROPLASTY Right 03/16/2014   Procedure: RIGHT TOTAL KNEE ARTHROPLASTY;  Surgeon: Gearlean Alf, MD;  Location: [facility];  Service: Orthopedics;  Laterality: Right;  Marland Kitchen VASECTOMY  1990  . wisdom  teeth extractiion      Social History   Socioeconomic History  . Marital status: Married    Spouse name: Not on file  . Number of children: 2  . Years of education: Not on file  . Highest education level: Not on file  Occupational History  . Occupation: [employer], sales   Social Needs  . Financial resource strain: Not on file  . Food insecurity    Worry: Not on file    Inability: Not on file  . Transportation needs    Medical: Not on file    Non-medical: Not on file  Tobacco Use  . Smoking status: Never Smoker  . Smokeless tobacco: Never Used  Substance and Sexual Activity  . Alcohol use: Yes    Comment: socially   . Drug use: No  . Sexual activity: Yes  Lifestyle  . Physical activity    Days per week: Not on file    Minutes per session: Not on file  . Stress: Not on file  Relationships  . Social Herbalist on phone: Not on file    Gets together: Not on file    Attends religious service: Not on file    Active member of club or organization: Not on file    Attends meetings of clubs or organizations: Not on file  Relationship status: Not on file  . Intimate partner violence    Fear of current or ex partner: Not on file    Emotionally abused: Not on file    Physically abused: Not on file    Forced sexual activity: Not on file  Other Topics Concern  . Not on file  Social History Narrative   Lives w/ wife, 1 child and mother in law          Allergies as of 12/05/2018   No Known Allergies     Medication List       Accurate as of December 05, 2018 11:59 PM. If you have any questions, ask your nurse or doctor.        STOP taking these medications   cephALEXin 500 MG capsule Commonly known as: KEFLEX Stopped by: Willow OraJose Paz, MD     TAKE these medications   amphetamine-dextroamphetamine 20 MG 24 hr capsule Commonly known as: ADDERALL XR Take 1 capsule (20 mg total) by mouth 2 (two) times daily.   aspirin EC 81 MG tablet Take 81 mg by  mouth daily.   atorvastatin 10 MG tablet Commonly known as: LIPITOR Take 1 tablet (10 mg total) by mouth daily.   B-D 3CC LUER-LOK SYR 23GX1-1/2 23G X 1-1/2" 3 ML Misc Generic drug: SYRINGE-NEEDLE (DISP) 3 ML USE AS DIRECTED FOR INTRAMUSCULAR TESTOSTERONE INJECTIONS   buPROPion 150 MG 24 hr tablet Commonly known as: WELLBUTRIN XL TAKE 1 TABLET BY MOUTH ONCE DAILY   collagenase ointment Commonly known as: SANTYL Apply 1 application topically daily.   metoprolol tartrate 50 MG tablet Commonly known as: LOPRESSOR Take 1 tablet (50 mg total) by mouth 2 (two) times daily. What changed: additional instructions Changed by: Willow OraJose Paz, MD   sertraline 100 MG tablet Commonly known as: ZOLOFT Take 1 tablet (100 mg total) by mouth every morning.   Subvenite 150 MG tablet Generic drug: lamoTRIgine TAKE 1 & 1/2 TABLETS BY MOUTH DAILY.   testosterone cypionate 200 MG/ML injection Commonly known as: DEPOTESTOSTERONE CYPIONATE INJECT 1 ML INTO THE MUSCLE EVERY 14 DAYS   triamcinolone cream 0.1 % Commonly known as: KENALOG           Objective:   Physical Exam BP 138/62 (BP Location: Left Arm, Patient Position: Sitting, Cuff Size: Normal)   Pulse 62   Temp 98.2 F (36.8 C) (Oral)   Resp 16   Ht 6\' 4"  (1.93 m)   Wt (!) 301 lb (136.5 kg)   SpO2 98%   BMI 36.64 kg/m  General:   Well developed, NAD, BMI noted. HEENT:  Normocephalic . Face symmetric, atraumatic  Skin: Left pretibial area, no TTP.  See picture. Neurologic:  alert & oriented X3.  Speech normal, gait appropriate for age and unassisted Psych--  Cognition and judgment appear intact.  Cooperative with normal attention span and concentration.  Behavior appropriate. No anxious or depressed appearing.        Assessment     Assessment Hyperglycemia  HTN-- lopressor Hyperlipidemia-- lipitor  Depression, ADD. Used to see Dr. Nolen MuMcKinney, now sees [facility] Elevated LFTs Edema and  Stasis dermatitis  DJD  OSA -- on CPAP Morbid obesity  Hypogonadism -- f/u pcp  PLAN:  Stasis  Dermatitis: Improving, continue Santyl, encouraged leg elevation, encourage clean the area with soap and water twice a day. If improvement does not continue in the next few days he will call otherwise follow-up in October. Strongly recommend a flu shot this fall, patient never  had one before.

## 2018-12-05 NOTE — Progress Notes (Signed)
Pre visit review using our clinic review tool, if applicable. No additional management support is needed unless otherwise documented below in the visit note. 

## 2018-12-06 NOTE — Assessment & Plan Note (Signed)
Stasis  Dermatitis: Improving, continue Santyl, encouraged leg elevation, encourage clean the area with soap and water twice a day. If improvement does not continue in the next few days he will call otherwise follow-up in October. Strongly recommend a flu shot this fall, patient never had one before.

## 2018-12-16 MED FILL — buPROPion HCL ER (XL) 150 M: 150 | 30 days supply | Qty: 30 | Fill #2

## 2018-12-16 MED FILL — ATORVASTATIN 10 MG TABLET: 10 | 90 days supply | Qty: 90 | Fill #2

## 2018-12-16 MED FILL — SUBVENITE 150 MG TABS: 150 | 30 days supply | Qty: 45 | Fill #1

## 2019-01-06 MED FILL — TESTOSTERONE CYP 200 MG/ML: 200 | 84 days supply | Qty: 6 | Fill #1

## 2019-01-07 ENCOUNTER — Other Ambulatory Visit: Payer: Self-pay

## 2019-01-07 MED ORDER — SERTRALINE HCL 100 MG PO TABS: 100.0000 mg | ORAL_TABLET | Freq: Every day | ORAL | 5 refills | Status: DC

## 2019-01-07 MED FILL — SERTRALINE HCL 100 MG TAB: 100 | 30 days supply | Qty: 30 | Fill #0

## 2019-01-29 ENCOUNTER — Other Ambulatory Visit: Payer: Self-pay | Admitting: Psychiatry

## 2019-01-29 DIAGNOSIS — F988 Other specified behavioral and emotional disorders with onset usually occurring in childhood and adolescence: Secondary | ICD-10-CM

## 2019-01-29 MED FILL — SUBVENITE 150 MG TABS: 150 | 30 days supply | Qty: 45 | Fill #2

## 2019-01-30 NOTE — Telephone Encounter (Signed)
Last appt 09/2018 due back this month

## 2019-01-31 MED FILL — ADDERALL XR 20 MG CAP SA: 20 | 90 days supply | Qty: 180 | Fill #0

## 2019-01-31 MED FILL — buPROPion HCL ER (XL) 150 M: 150 | 90 days supply | Qty: 90 | Fill #0

## 2019-02-11 ENCOUNTER — Encounter: Payer: Self-pay | Admitting: Psychiatry

## 2019-02-11 ENCOUNTER — Other Ambulatory Visit: Payer: Self-pay

## 2019-02-11 ENCOUNTER — Ambulatory Visit (INDEPENDENT_AMBULATORY_CARE_PROVIDER_SITE_OTHER): Payer: 59 | Admitting: Psychiatry

## 2019-02-11 VITALS — BP 127/66 | HR 68

## 2019-02-11 DIAGNOSIS — F4010 Social phobia, unspecified: Secondary | ICD-10-CM

## 2019-02-11 DIAGNOSIS — F5105 Insomnia due to other mental disorder: Secondary | ICD-10-CM

## 2019-02-11 DIAGNOSIS — F39 Unspecified mood [affective] disorder: Secondary | ICD-10-CM

## 2019-02-11 DIAGNOSIS — F988 Other specified behavioral and emotional disorders with onset usually occurring in childhood and adolescence: Secondary | ICD-10-CM | POA: Diagnosis not present

## 2019-02-11 NOTE — Progress Notes (Signed)
James Boone [id number] [birth year] 58 y.o.    James Rinne, MD   Subjective:   Patient ID:  James Boone is a 58 y.o. (DOB Jun 04, 1960) male.  Chief Complaint:  Chief Complaint  Patient presents with  . Follow-up    Medication Management  . Depression    Medication Management  . Other    Bipolar 2    HPI James Boone presents for follow-up of ADD and bipolar disorder.  Last visit was May 22 , 2020.  No meds were changed.  It looks like he is remained on the same med regimen for several years.  Still working Sales executive.  Work function is OK.  Short-handed.  These are the highest dosage of sertraline and Wellbutrin.  Adderall XR still works for focus.  Takes all 40 mg in the morning.  Misses it if not taken.  No major concerns re: mental health nor meds. Pt reports that mood is good for the most part.  Some days are harder. describes anxiety as Minimal. Anxiety symptoms include: Social Anxiety,. Pt reports some irregularity with 4-5 hours typical for years. No specific interference.  Occ EMA.   No sleepers.  Occ drowsy.  . Pt reports that appetite is good. Pt reports that energy is good and good. Concentration is down slightly. Suicidal thoughts:  denied by patient.  No suicide attempts.  Denies mood swings lately.  Last sign depression usually situational and doesn't last long.  Past Psychiatric Medication Trials: Adderall, sertraline, Wellbutrin, lamotrigine  Review of Systems:  Review of Systems  Musculoskeletal: Positive for arthralgias.  Skin:       Episode cellulitis  Neurological: Negative for tremors and weakness.  Psychiatric/Behavioral: Positive for depression.    Medications: I have reviewed the patient's current medications.  Current Outpatient Medications  Medication Sig Dispense Refill  . ADDERALL XR 20 MG 24 hr capsule TAKE 1 CAPSULE BY MOUTH 2 TIMES DAILY. 180 capsule 0  . aspirin EC 81 MG tablet Take 81 mg by mouth daily.    Marland Kitchen atorvastatin  (LIPITOR) 10 MG tablet Take 1 tablet (10 mg total) by mouth daily. 90 tablet 2  . B-D 3CC LUER-LOK SYR 23GX1-1/2 23G X 1-1/2" 3 ML MISC USE AS DIRECTED FOR INTRAMUSCULAR TESTOSTERONE INJECTIONS 100 each 11  . buPROPion (WELLBUTRIN XL) 150 MG 24 hr tablet TAKE 1 TABLET BY MOUTH ONCE DAILY 90 tablet 0  . collagenase (SANTYL) ointment Apply 1 application topically daily. 30 g 0  . metoprolol tartrate (LOPRESSOR) 50 MG tablet Take 1 tablet (50 mg total) by mouth 2 (two) times daily. 180 tablet 1  . sertraline (ZOLOFT) 100 MG tablet Take 1 tablet (100 mg total) by mouth daily. 30 tablet 5  . SUBVENITE 150 MG tablet TAKE 1 & 1/2 TABLETS BY MOUTH DAILY. 45 tablet 5  . triamcinolone cream (KENALOG) 0.1 %   0   No current facility-administered medications for this visit.     Medication Side Effects: None except dry  Allergies: No Known Allergies  Past Medical History:  Diagnosis Date  . Abnormal liver function test   . Acquired cavovarus deformity of right foot 2016   Dr. Victorino Dike, using boot modification and [place] brace  . ADD (attention deficit disorder)   . ADD (attention deficit disorder)   . Allergic rhinitis   . Arthritis   . Back pain    lower  . Complete rotator cuff tear of left shoulder 07/12/2017  . Depression   .  Hemorrhoid   . Hypercholesteremia   . Hyperglycemia   . Hyperlipidemia   . Hypertension   . Hypogonadism male   . Peripheral edema   . Sleep apnea 2005   dx in 2005 , on CPAP setting of 13  . Varicose veins    both legs    Family History  Problem Relation Age of Onset  . Depression Sister        no suicide   . Stroke Father        ag ~ 68  . Coronary artery disease Father        smoker, MI and a CABG at 30 y/o  . Colon cancer Neg Hx   . Prostate cancer Neg Hx   . Diabetes Neg Hx     Social History   Socioeconomic History  . Marital status: Married    Spouse name: Not on file  . Number of children: 2  . Years of education: Not on file  . Highest  education level: Not on file  Occupational History  . Occupation: [employer], sales   Social Needs  . Financial resource strain: Not on file  . Food insecurity    Worry: Not on file    Inability: Not on file  . Transportation needs    Medical: Not on file    Non-medical: Not on file  Tobacco Use  . Smoking status: Never Smoker  . Smokeless tobacco: Never Used  Substance and Sexual Activity  . Alcohol use: Yes    Comment: socially   . Drug use: No  . Sexual activity: Yes  Lifestyle  . Physical activity    Days per week: Not on file    Minutes per session: Not on file  . Stress: Not on file  Relationships  . Social Musician on phone: Not on file    Gets together: Not on file    Attends religious service: Not on file    Active member of club or organization: Not on file    Attends meetings of clubs or organizations: Not on file    Relationship status: Not on file  . Intimate partner violence    Fear of current or ex partner: Not on file    Emotionally abused: Not on file    Physically abused: Not on file    Forced sexual activity: Not on file  Other Topics Concern  . Not on file  Social History Narrative   Lives w/ wife, 1 child and mother in law        Past Medical History, Surgical history, Social history, and Family history were reviewed and updated as appropriate.   Please see review of systems for further details on the patient's review from today.   Objective:   Physical Exam:  BP 127/66   Pulse 68   Physical Exam Constitutional:      General: He is not in acute distress.    Appearance: He is well-developed. He is obese.  Musculoskeletal:        General: No deformity.  Neurological:     Mental Status: He is alert and oriented to person, place, and time.     Cranial Nerves: No dysarthria.     Coordination: Coordination normal.  Psychiatric:        Attention and Perception: Attention and perception normal. He does not perceive  auditory or visual hallucinations.        Mood and Affect: Mood is anxious.  Mood is not depressed. Affect is blunt. Affect is not labile, angry or inappropriate.        Speech: Speech normal.        Behavior: Behavior normal. Behavior is cooperative.        Thought Content: Thought content normal. Thought content is not paranoid or delusional. Thought content does not include homicidal or suicidal ideation. Thought content does not include homicidal or suicidal plan.        Cognition and Memory: Cognition and memory normal.        Judgment: Judgment normal.     Comments: Insight intact. No delusions.      Lab Review:     Component Value Date/Time   NA 141 05/01/2018 1358   K 4.7 05/01/2018 1358   CL 101 05/01/2018 1358   CO2 33 (H) 05/01/2018 1358   GLUCOSE 93 05/01/2018 1358   BUN 19 05/01/2018 1358   CREATININE 1.14 05/01/2018 1358   CALCIUM 9.8 05/01/2018 1358   PROT 6.8 05/01/2018 1358   ALBUMIN 4.1 05/01/2018 1358   AST 37 05/01/2018 1358   ALT 31 05/01/2018 1358   ALKPHOS 87 05/01/2018 1358   BILITOT 0.6 05/01/2018 1358   GFRNONAA >90 03/18/2014 0125   GFRAA >90 03/18/2014 0125       Component Value Date/Time   WBC 6.0 05/01/2018 1358   RBC 5.22 05/01/2018 1358   HGB 15.2 05/01/2018 1358   HCT 45.8 05/01/2018 1358   PLT 239.0 05/01/2018 1358   MCV 87.9 05/01/2018 1358   MCH 28.7 03/18/2014 0125   MCHC 33.1 05/01/2018 1358   RDW 15.9 (H) 05/01/2018 1358   LYMPHSABS 1.7 05/01/2018 1358   MONOABS 0.7 05/01/2018 1358   EOSABS 0.3 05/01/2018 1358   BASOSABS 0.0 05/01/2018 1358    No results found for: POCLITH, LITHIUM   No results found for: PHENYTOIN, PHENOBARB, VALPROATE, CBMZ   .res Assessment: Plan:    Attention deficit disorder (ADD) without hyperactivity  Social anxiety disorder  Insomnia due to mental condition  Episodic mood disorder ([place])  Per Dr. Andres Labrum note's patient likely has Asberger's syndrome plus or minus social anxiety as  well.  The patient is satisfied with his current medications.  His answers were brief to questions but he denied most psychiatric symptoms except for some situational anxiety and depressive symptoms.  The primary stressors are probably financial at this point.  Overall he is satisfied with his medication and does not want any medication changes.  Discussed potential benefits, risks, and side effects of stimulants with patient to include increased heart rate, palpitations, insomnia, increased anxiety, increased irritability, or decreased appetite.  Instructed patient to contact office if experiencing any significant tolerability issues.  He feels he is getting adequate duration out of the Adderall XR taking 2 each morning.  He does not have a significant crash.  No particular mood swings were noted.  He was cautioned that both sertraline and stimulants can cause mood swings and bipolar patients and to let us know if those occur.  No med changes today  Follow-up 6 months  Lynder Parents MD, DFAPA  Please see After Visit Summary for patient specific instructions.  Future Appointments  Date Time Provider [place]  02/25/2019  8:20 AM Colon Branch, MD [id number]    No orders of the defined types were placed in this encounter.     -------------------------------

## 2019-02-25 ENCOUNTER — Ambulatory Visit: Payer: 59 | Admitting: Internal Medicine

## 2019-03-25 ENCOUNTER — Encounter: Payer: Self-pay | Admitting: Internal Medicine

## 2019-03-31 MED FILL — SUBVENITE 150 MG TABS: 150 | 30 days supply | Qty: 45 | Fill #3

## 2019-03-31 MED FILL — METOPROLOL TARTRATE 50 MG T: 50 | 90 days supply | Qty: 180 | Fill #1

## 2019-03-31 MED FILL — SERTRALINE HCL 100 MG TAB: 100 | 30 days supply | Qty: 30 | Fill #1

## 2019-04-25 ENCOUNTER — Other Ambulatory Visit: Payer: Self-pay | Admitting: Internal Medicine

## 2019-04-25 MED FILL — ATORVASTATIN 10 MG TABLET: 10 | 30 days supply | Qty: 30 | Fill #0

## 2019-04-29 MED FILL — SERTRALINE HCL 100 MG TAB: 100 | 30 days supply | Qty: 30 | Fill #2

## 2019-05-13 MED FILL — SUBVENITE 150 MG TABS: 150 | 30 days supply | Qty: 45 | Fill #4

## 2019-05-28 ENCOUNTER — Other Ambulatory Visit: Payer: Self-pay | Admitting: Psychiatry

## 2019-05-28 ENCOUNTER — Other Ambulatory Visit: Payer: Self-pay | Admitting: Internal Medicine

## 2019-05-28 DIAGNOSIS — F988 Other specified behavioral and emotional disorders with onset usually occurring in childhood and adolescence: Secondary | ICD-10-CM

## 2019-05-28 MED FILL — SERTRALINE HCL 100 MG TAB: 100 | 30 days supply | Qty: 30 | Fill #3

## 2019-05-28 MED FILL — BUPROPION HCL XL 150 MG TAB: 150 | 90 days supply | Qty: 90 | Fill #0

## 2019-05-28 NOTE — Telephone Encounter (Signed)
Duplicate request from pharmacy for adderall Xr, patient asking for 30 day only. Just refuse the 90 day please

## 2019-05-29 MED FILL — AMPHETAMINE-DEXTROAMPHET ER: 20 | 30 days supply | Qty: 60 | Fill #0

## 2019-05-29 MED FILL — ATORVASTATIN 10 MG TABLET: 10 | 30 days supply | Qty: 30 | Fill #0

## 2019-07-04 ENCOUNTER — Other Ambulatory Visit: Payer: Self-pay | Admitting: Internal Medicine

## 2019-07-04 MED FILL — ATORVASTATIN 10 MG TABLET: 10 | 15 days supply | Qty: 15 | Fill #0

## 2019-07-04 MED FILL — METOPROLOL TARTRATE 50 MG T: 50 | 15 days supply | Qty: 30 | Fill #0

## 2019-07-14 MED FILL — SERTRALINE HCL 100 MG TAB: 100 | 30 days supply | Qty: 30 | Fill #4

## 2019-07-14 MED FILL — AMPHETAMINE-DEXTROAMPHET ER: 20 | 30 days supply | Qty: 60 | Fill #0

## 2019-07-15 MED FILL — SUBVENITE 150 MG TABS: 150 | 30 days supply | Qty: 45 | Fill #5

## 2019-07-24 ENCOUNTER — Other Ambulatory Visit: Payer: Self-pay | Admitting: Internal Medicine

## 2019-07-25 ENCOUNTER — Encounter: Payer: Self-pay | Admitting: Internal Medicine

## 2019-07-25 MED ORDER — METOPROLOL TARTRATE 50 MG PO TABS: 50.0000 mg | ORAL_TABLET | Freq: Two times a day (BID) | ORAL | 0 refills | Status: DC

## 2019-07-25 MED FILL — METOPROLOL TARTRATE 50 MG T: 50 | 15 days supply | Qty: 30 | Fill #0

## 2019-07-28 ENCOUNTER — Other Ambulatory Visit: Payer: Self-pay | Admitting: Internal Medicine

## 2019-07-28 MED FILL — ATORVASTATIN 10 MG TABLET: 10 | 15 days supply | Qty: 15 | Fill #0

## 2019-08-01 ENCOUNTER — Encounter: Payer: Self-pay | Admitting: Internal Medicine

## 2019-08-12 ENCOUNTER — Telehealth: Payer: Self-pay | Admitting: Psychiatry

## 2019-08-12 ENCOUNTER — Ambulatory Visit (INDEPENDENT_AMBULATORY_CARE_PROVIDER_SITE_OTHER): Payer: Self-pay | Admitting: Psychiatry

## 2019-08-12 ENCOUNTER — Other Ambulatory Visit: Payer: Self-pay

## 2019-08-12 ENCOUNTER — Encounter: Payer: Self-pay | Admitting: Psychiatry

## 2019-08-12 VITALS — BP 159/96 | HR 66

## 2019-08-12 DIAGNOSIS — F988 Other specified behavioral and emotional disorders with onset usually occurring in childhood and adolescence: Secondary | ICD-10-CM

## 2019-08-12 DIAGNOSIS — I10 Essential (primary) hypertension: Secondary | ICD-10-CM

## 2019-08-12 DIAGNOSIS — F4010 Social phobia, unspecified: Secondary | ICD-10-CM

## 2019-08-12 DIAGNOSIS — F5105 Insomnia due to other mental disorder: Secondary | ICD-10-CM

## 2019-08-12 DIAGNOSIS — F39 Unspecified mood [affective] disorder: Secondary | ICD-10-CM

## 2019-08-12 MED ORDER — METOPROLOL TARTRATE 50 MG PO TABS: 50.0000 mg | ORAL_TABLET | Freq: Two times a day (BID) | ORAL | 0 refills | Status: DC

## 2019-08-12 MED ORDER — AMPHETAMINE-DEXTROAMPHET ER 20 MG PO CP24: 20.0000 mg | ORAL_CAPSULE | Freq: Two times a day (BID) | ORAL | 0 refills | Status: DC

## 2019-08-12 MED FILL — METOPROLOL TARTRATE 50 MG T: 50 | 30 days supply | Qty: 60 | Fill #0

## 2019-08-12 MED FILL — AMPHETAMINE-DEXTROAMPHET ER: 20 | 30 days supply | Qty: 60 | Fill #0

## 2019-08-12 NOTE — Telephone Encounter (Signed)
Bard Herbert is requesting a call back concerning the directions on the Adderall XR prescribed by Dr. Jennelle Human. Please clarify.

## 2019-08-12 NOTE — Progress Notes (Signed)
James Boone [id number] 02/08/1961 59 y.o.    James Shoemaker, MD   Subjective:   Patient ID:  James Boone is a 59 y.o. (DOB [birth year]) male.  Chief Complaint:  Chief Complaint  Patient presents with  . Follow-up    Medication Management  . ADHD    Medication Management  . Other    Mood disorder    Depression        James Boone presents for follow-up of ADD and bipolar disorder.  Last visit was Sept , 2020.  No meds were changed.  It looks like he is remained on the same med regimen for several years.  Still working Financial planner.  Work function is OK.  Short-handed.  These are the highest dosage of sertraline and Wellbutrin.  Adderall XR still works for focus.  Takes all 40 mg in the morning.  Misses it if not taken.  No major concerns re: mental health nor meds. Pt reports that mood is good for the most part. No sig depression.  Situational anxiety.   Some days are harder. describes anxiety as Minimal. Anxiety symptoms include: Social Anxiety,. Pt reports some irregularity with 4-5 hours typical for years. Enough sleep.No specific interference.  Occ EMA.   No sleepers.  Occ drowsy.  . Pt reports that appetite is good. Pt reports that energy is good and good. Concentration is down slightly. Suicidal thoughts:  denied by patient.  No suicide attempts.  Denies mood swings lately.  Last sign depression usually situational and doesn't last long.  Past Psychiatric Medication Trials: Adderall, sertraline, Wellbutrin, lamotrigine  Review of Systems:  Review of Systems  Cardiovascular: Positive for palpitations.  Musculoskeletal: Positive for arthralgias.  Skin:       Episode cellulitis  Neurological: Negative for tremors and weakness.  Psychiatric/Behavioral: Positive for depression.    Medications: I have reviewed the patient's current medications.  Current Outpatient Medications  Medication Sig Dispense Refill  . amphetamine-dextroamphetamine (ADDERALL XR) 20 MG  24 hr capsule Take 1 capsule (20 mg total) by mouth in the morning and at bedtime. 60 capsule 0  . [START ON 09/09/2019] amphetamine-dextroamphetamine (ADDERALL XR) 20 MG 24 hr capsule Take 1 capsule (20 mg total) by mouth in the morning and at bedtime. 60 capsule 0  . ascorbic acid (VITAMIN C) 500 MG tablet Take by mouth.    Marland Kitchen aspirin EC 81 MG tablet Take 81 mg by mouth daily.    Marland Kitchen atorvastatin (LIPITOR) 10 MG tablet Take 1 tablet (10 mg total) by mouth daily. 15 tablet 0  . B-D 3CC LUER-LOK SYR 23GX1-1/2 23G X 1-1/2" 3 ML MISC USE AS DIRECTED FOR INTRAMUSCULAR TESTOSTERONE INJECTIONS 100 each 11  . buPROPion (WELLBUTRIN XL) 150 MG 24 hr tablet TAKE 1 TABLET BY MOUTH ONCE A DAY 90 tablet 0  . Cyanocobalamin (VITAMIN B 12 PO) Take by mouth.    . metoprolol tartrate (LOPRESSOR) 50 MG tablet Take 1 tablet (50 mg total) by mouth 2 (two) times daily. 60 tablet 0  . Multiple Vitamin (MULTIVITAMIN ADULT PO) Take by mouth.    . sertraline (ZOLOFT) 100 MG tablet Take 1 tablet (100 mg total) by mouth daily. 30 tablet 5  . SUBVENITE 150 MG tablet TAKE 1 & 1/2 TABLETS BY MOUTH DAILY. 45 tablet 5  . testosterone cypionate (DEPOTESTOSTERONE CYPIONATE) 200 MG/ML injection Inject 1.5 mL intramuscularly every 2 weeks.    . triamcinolone cream (KENALOG) 0.1 %   0  . [  START ON 10/07/2019] amphetamine-dextroamphetamine (ADDERALL XR) 20 MG 24 hr capsule Take 1 capsule (20 mg total) by mouth in the morning and at bedtime. 60 capsule 0   No current facility-administered medications for this visit.    Medication Side Effects: None except dry  Allergies: No Known Allergies  Past Medical History:  Diagnosis Date  . Abnormal liver function test   . Acquired cavovarus deformity of right foot 2016   Dr. Victorino Dike, using boot modification and [place] brace  . ADD (attention deficit disorder)   . ADD (attention deficit disorder)   . Allergic rhinitis   . Arthritis   . Back pain    lower  . Complete rotator cuff tear  of left shoulder 07/12/2017  . Depression   . Hemorrhoid   . Hypercholesteremia   . Hyperglycemia   . Hyperlipidemia   . Hypertension   . Hypogonadism male   . Peripheral edema   . Sleep apnea 2005   dx in 2005 , on CPAP setting of 13  . Varicose veins    both legs    Family History  Problem Relation Age of Onset  . Depression Sister        no suicide   . Stroke Father        ag ~ 19  . Coronary artery disease Father        smoker, MI and a CABG at 46 y/o  . Colon cancer Neg Hx   . Prostate cancer Neg Hx   . Diabetes Neg Hx     Social History   Socioeconomic History  . Marital status: Married    Spouse name: Not on file  . Number of children: 2  . Years of education: Not on file  . Highest education level: Not on file  Occupational History  . Occupation: [employer], sales   Tobacco Use  . Smoking status: Never Smoker  . Smokeless tobacco: Never Used  Substance and Sexual Activity  . Alcohol use: Yes    Comment: socially   . Drug use: No  . Sexual activity: Yes  Other Topics Concern  . Not on file  Social History Narrative   Lives w/ wife, 1 child and mother in law       Social Determinants of Health   Financial Resource Strain:   . Difficulty of Paying Living Expenses:   Food Insecurity:   . Worried About Programme researcher, broadcasting/film/video in the Last Year:   . Barista in the Last Year:   Transportation Needs:   . Freight forwarder (Medical):   Marland Kitchen Lack of Transportation (Non-Medical):   Physical Activity:   . Days of Exercise per Week:   . Minutes of Exercise per Session:   Stress:   . Feeling of Stress :   Social Connections:   . Frequency of Communication with Friends and Family:   . Frequency of Social Gatherings with Friends and Family:   . Attends Religious Services:   . Active Member of Clubs or Organizations:   . Attends Banker Meetings:   Marland Kitchen Marital Status:   Intimate Partner Violence:   . Fear of Current or  Ex-Partner:   . Emotionally Abused:   Marland Kitchen Physically Abused:   . Sexually Abused:     Past Medical History, Surgical history, Social history, and Family history were reviewed and updated as appropriate.   Please see review of systems for further details on the patient's review  from today.   Objective:   Physical Exam:  BP (!) 159/96   Pulse 66   Physical Exam Constitutional:      General: He is not in acute distress.    Appearance: He is well-developed. He is obese.  Musculoskeletal:        General: No deformity.  Neurological:     Mental Status: He is alert and oriented to person, place, and time.     Cranial Nerves: No dysarthria.     Coordination: Coordination normal.  Psychiatric:        Attention and Perception: Attention and perception normal. He does not perceive auditory or visual hallucinations.        Mood and Affect: Mood is anxious. Mood is not depressed. Affect is blunt. Affect is not labile, angry or inappropriate.        Speech: Speech normal.        Behavior: Behavior normal. Behavior is cooperative.        Thought Content: Thought content normal. Thought content is not paranoid or delusional. Thought content does not include homicidal or suicidal ideation. Thought content does not include homicidal or suicidal plan.        Cognition and Memory: Cognition and memory normal.        Judgment: Judgment normal.     Comments: Insight intact. No delusions.  Hypoverbal     Lab Review:     Component Value Date/Time   NA 141 05/01/2018 1358   K 4.7 05/01/2018 1358   CL 101 05/01/2018 1358   CO2 33 (H) 05/01/2018 1358   GLUCOSE 93 05/01/2018 1358   BUN 19 05/01/2018 1358   CREATININE 1.14 05/01/2018 1358   CALCIUM 9.8 05/01/2018 1358   PROT 6.8 05/01/2018 1358   ALBUMIN 4.1 05/01/2018 1358   AST 37 05/01/2018 1358   ALT 31 05/01/2018 1358   ALKPHOS 87 05/01/2018 1358   BILITOT 0.6 05/01/2018 1358   GFRNONAA >90 03/18/2014 0125   GFRAA >90 03/18/2014 0125        Component Value Date/Time   WBC 6.0 05/01/2018 1358   RBC 5.22 05/01/2018 1358   HGB 15.2 05/01/2018 1358   HCT 45.8 05/01/2018 1358   PLT 239.0 05/01/2018 1358   MCV 87.9 05/01/2018 1358   MCH 28.7 03/18/2014 0125   MCHC 33.1 05/01/2018 1358   RDW 15.9 (H) 05/01/2018 1358   LYMPHSABS 1.7 05/01/2018 1358   MONOABS 0.7 05/01/2018 1358   EOSABS 0.3 05/01/2018 1358   BASOSABS 0.0 05/01/2018 1358    No results found for: POCLITH, LITHIUM   No results found for: PHENYTOIN, PHENOBARB, VALPROATE, CBMZ   .res Assessment: Plan:    Episodic mood disorder (HCC)  Social anxiety disorder  Attention deficit disorder (ADD) without hyperactivity - Plan: amphetamine-dextroamphetamine (ADDERALL XR) 20 MG 24 hr capsule, amphetamine-dextroamphetamine (ADDERALL XR) 20 MG 24 hr capsule, amphetamine-dextroamphetamine (ADDERALL XR) 20 MG 24 hr capsule  Insomnia due to mental condition  Essential hypertension - Plan: metoprolol tartrate (LOPRESSOR) 50 MG tablet  Per Dr. Christena Deem note's patient likely has Asberger's syndrome plus or minus social anxiety as well.  The patient is satisfied with his current medications.  His answers were brief to questions but he denied most psychiatric symptoms except for some situational anxiety and depressive symptoms.  The primary stressors are probably financial at this point.  Overall he is satisfied with his medication and does not want any medication changes.  Discussed potential benefits, risks, and side effects of  stimulants with patient to include increased heart rate, palpitations, insomnia, increased anxiety, increased irritability, or decreased appetite.  Instructed patient to contact office if experiencing any significant tolerability issues. Ran out of BP med and it's up today.  He plans to address it .  Will send in brief supply and hell call today.  He feels he is getting adequate duration out of the Adderall XR taking 2 each morning.  He does  not have a significant crash.  No particular mood swings were noted.  He was cautioned that both sertraline and stimulants can cause mood swings and bipolar patients and to let us know if those occur.  No med changes today  Follow-up 6 months  Meredith Staggers MD, DFAPA  Please see After Visit Summary for patient specific instructions.  No future appointments.  No orders of the defined types were placed in this encounter.     -------------------------------

## 2019-08-12 NOTE — Telephone Encounter (Signed)
Instructions on Adderall XR should be 1 in the morning and 1 in the afternoon.  Please clarify with the pharmacy.

## 2019-08-13 NOTE — Telephone Encounter (Signed)
[facility] notified to change all 3 RX's to Adderall XR 1 in the am and 1 in the afternoon.

## 2019-08-15 ENCOUNTER — Other Ambulatory Visit: Payer: Self-pay | Admitting: Internal Medicine

## 2019-08-15 MED FILL — SERTRALINE HCL 100 MG TAB: 100 | 30 days supply | Qty: 30 | Fill #5

## 2019-08-20 ENCOUNTER — Ambulatory Visit (INDEPENDENT_AMBULATORY_CARE_PROVIDER_SITE_OTHER): Payer: Self-pay | Admitting: Internal Medicine

## 2019-08-20 ENCOUNTER — Other Ambulatory Visit: Payer: Self-pay

## 2019-08-20 ENCOUNTER — Encounter: Payer: Self-pay | Admitting: Internal Medicine

## 2019-08-20 VITALS — BP 146/69 | HR 71 | Temp 96.4°F | Resp 18 | Ht 76.0 in | Wt 310.0 lb

## 2019-08-20 DIAGNOSIS — E291 Testicular hypofunction: Secondary | ICD-10-CM

## 2019-08-20 DIAGNOSIS — E785 Hyperlipidemia, unspecified: Secondary | ICD-10-CM

## 2019-08-20 DIAGNOSIS — I872 Venous insufficiency (chronic) (peripheral): Secondary | ICD-10-CM

## 2019-08-20 DIAGNOSIS — R739 Hyperglycemia, unspecified: Secondary | ICD-10-CM

## 2019-08-20 DIAGNOSIS — I10 Essential (primary) hypertension: Secondary | ICD-10-CM

## 2019-08-20 LAB — CBC WITH DIFFERENTIAL/PLATELET
Basophils Absolute: 0 10*3/uL (ref 0.0–0.1)
Basophils Relative: 0.7 % (ref 0.0–3.0)
Eosinophils Absolute: 0.2 10*3/uL (ref 0.0–0.7)
Eosinophils Relative: 3.8 % (ref 0.0–5.0)
HCT: 44.4 % (ref 39.0–52.0)
Hemoglobin: 14.8 g/dL (ref 13.0–17.0)
Lymphocytes Relative: 30.3 % (ref 12.0–46.0)
Lymphs Abs: 1.8 10*3/uL (ref 0.7–4.0)
MCHC: 33.3 g/dL (ref 30.0–36.0)
MCV: 86.8 fl (ref 78.0–100.0)
Monocytes Absolute: 0.6 10*3/uL (ref 0.1–1.0)
Monocytes Relative: 9.3 % (ref 3.0–12.0)
Neutro Abs: 3.3 10*3/uL (ref 1.4–7.7)
Neutrophils Relative %: 55.9 % (ref 43.0–77.0)
Platelets: 233 10*3/uL (ref 150.0–400.0)
RBC: 5.12 Mil/uL (ref 4.22–5.81)
RDW: 16.1 % — ABNORMAL HIGH (ref 11.5–15.5)
WBC: 5.9 10*3/uL (ref 4.0–10.5)

## 2019-08-20 LAB — COMPREHENSIVE METABOLIC PANEL
ALT: 29 U/L (ref 0–53)
AST: 27 U/L (ref 0–37)
Albumin: 4.1 g/dL (ref 3.5–5.2)
Alkaline Phosphatase: 114 U/L (ref 39–117)
BUN: 24 mg/dL — ABNORMAL HIGH (ref 6–23)
CO2: 30 mEq/L (ref 19–32)
Calcium: 9.2 mg/dL (ref 8.4–10.5)
Chloride: 103 mEq/L (ref 96–112)
Creatinine, Ser: 0.96 mg/dL (ref 0.40–1.50)
GFR: 79.74 mL/min (ref >60.00)
Glucose, Bld: 133 mg/dL — ABNORMAL HIGH (ref 70–99)
Potassium: 4.4 mEq/L (ref 3.5–5.1)
Sodium: 138 mEq/L (ref 135–145)
Total Bilirubin: 0.4 mg/dL (ref 0.2–1.2)
Total Protein: 6.7 g/dL (ref 6.0–8.3)

## 2019-08-20 LAB — LIPID PANEL
Cholesterol: 151 mg/dL (ref 0–200)
HDL: 48.5 mg/dL (ref >39.00)
LDL Cholesterol: 67 mg/dL (ref 0–99)
NonHDL: 102.45
Total CHOL/HDL Ratio: 3
Triglycerides: 179 mg/dL — ABNORMAL HIGH (ref 0.0–149.0)
VLDL: 35.8 mg/dL (ref 0.0–40.0)

## 2019-08-20 LAB — HEMOGLOBIN A1C: Hgb A1c MFr Bld: 5.8 % (ref 4.6–6.5)

## 2019-08-20 LAB — PSA: PSA: 0.8 ng/mL (ref 0.10–4.00)

## 2019-08-20 MED ORDER — TESTOSTERONE CYPIONATE 200 MG/ML IM SOLN: INTRAMUSCULAR | 4 refills | Status: DC

## 2019-08-20 MED ORDER — ATORVASTATIN CALCIUM 10 MG PO TABS: 10.0000 mg | ORAL_TABLET | Freq: Every day | ORAL | 4 refills | Status: DC

## 2019-08-20 MED ORDER — METOPROLOL TARTRATE 50 MG PO TABS: 50.0000 mg | ORAL_TABLET | Freq: Two times a day (BID) | ORAL | 4 refills | Status: DC

## 2019-08-20 MED FILL — TESTOSTERONE CYP 200 MG/ML: 200 | 70 days supply | Qty: 10 | Fill #0

## 2019-08-20 MED FILL — ATORVASTATIN 10 MG TABLET: 10 | 30 days supply | Qty: 30 | Fill #0

## 2019-08-20 NOTE — Patient Instructions (Addendum)
Continue the same medications  Check your blood pressure twice a week BP GOAL is between 110/65 and  135/85. If it is consistently higher or lower, let me know  GO TO THE LAB : Get the blood work     GO TO THE FRONT DESK, please reschedule your appointments Come back for   a checkup in 4 months

## 2019-08-20 NOTE — Progress Notes (Signed)
Pre visit review using our clinic review tool, if applicable. No additional management support is needed unless otherwise documented below in the visit note. 

## 2019-08-20 NOTE — Progress Notes (Signed)
Subjective:    Patient ID: James Boone, male    DOB: 10/18/1960, 59 y.o.   MRN: [id number]  DOS:  08/20/2019 Type of visit - description: Follow-up, last office visit 11-2018 Since the last visit, he reports he is doing well. Good med compliance. We assess today all his chronic medical problems   Review of Systems Denies fever chills No chest pain no difficulty breathing No dysuria, gross hematuria difficulty urinating  Past Medical History:  Diagnosis Date  . Abnormal liver function test   . Acquired cavovarus deformity of right foot 2016   Dr. Doran Durand, using boot modification and [place] brace  . ADD (attention deficit disorder)   . ADD (attention deficit disorder)   . Allergic rhinitis   . Arthritis   . Back pain    lower  . Complete rotator cuff tear of left shoulder 07/12/2017  . Depression   . Hemorrhoid   . Hypercholesteremia   . Hyperglycemia   . Hyperlipidemia   . Hypertension   . Hypogonadism male   . Peripheral edema   . Sleep apnea 2005   dx in 2005 , on CPAP setting of 13  . Varicose veins    both legs    Past Surgical History:  Procedure Laterality Date  . HERNIA REPAIR  2001   abdominal hernia  . KNEE ARTHROSCOPY  05/2008   left, Dr.Geoffrey  . KNEE ARTHROSCOPY  2008   right, Dr.Geoffrey  . SHOULDER ARTHROSCOPY WITH ROTATOR CUFF REPAIR AND SUBACROMIAL DECOMPRESSION Left 07/12/2017   Procedure: LEFT SHOULDER ARTHROSCOPY DEBRIDEMENT, ACROMIOPLASTY, ROTATOR CUFF REPAIR;  Surgeon: Marchia Bond, MD;  Location: [facility];  Service: Orthopedics;  Laterality: Left;  . TOTAL ANKLE REPLACEMENT Right 2016  . TOTAL KNEE ARTHROPLASTY Left 12/30/2012   Procedure: LEFT TOTAL KNEE ARTHROPLASTY;  Surgeon: Gearlean Alf, MD;  Location: [facility];  Service: Orthopedics;  Laterality: Left;  . TOTAL KNEE ARTHROPLASTY Right 03/16/2014   Procedure: RIGHT TOTAL KNEE ARTHROPLASTY;  Surgeon: Gearlean Alf, MD;  Location: [facility];  Service: Orthopedics;   Laterality: Right;  Marland Kitchen VASECTOMY  1990  . wisdom teeth extractiion      Allergies as of 08/20/2019   No Known Allergies     Medication List       Accurate as of August 20, 2019 11:59 PM. If you have any questions, ask your nurse or doctor.        amphetamine-dextroamphetamine 20 MG 24 hr capsule Commonly known as: Adderall XR Take 1 capsule (20 mg total) by mouth in the morning and at bedtime.   amphetamine-dextroamphetamine 20 MG 24 hr capsule Commonly known as: Adderall XR Take 1 capsule (20 mg total) by mouth in the morning and at bedtime. Start taking on: September 09, 2019   amphetamine-dextroamphetamine 20 MG 24 hr capsule Commonly known as: ADDERALL XR Take 1 capsule (20 mg total) by mouth in the morning and at bedtime. Start taking on: Oct 07, 2019   ascorbic acid 500 MG tablet Commonly known as: VITAMIN C Take by mouth.   aspirin EC 81 MG tablet Take 81 mg by mouth daily.   atorvastatin 10 MG tablet Commonly known as: LIPITOR Take 1 tablet (10 mg total) by mouth daily.   B-D 3CC LUER-LOK SYR 23GX1-1/2 23G X 1-1/2" 3 ML Misc Generic drug: SYRINGE-NEEDLE (DISP) 3 ML USE AS DIRECTED FOR INTRAMUSCULAR TESTOSTERONE INJECTIONS   buPROPion 150 MG 24 hr tablet Commonly known as: WELLBUTRIN XL TAKE 1 TABLET  BY MOUTH ONCE A DAY   metoprolol tartrate 50 MG tablet Commonly known as: LOPRESSOR Take 1 tablet (50 mg total) by mouth 2 (two) times daily.   MULTIVITAMIN ADULT PO Take by mouth.   sertraline 100 MG tablet Commonly known as: ZOLOFT Take 1 tablet (100 mg total) by mouth daily.   Subvenite 150 MG tablet Generic drug: lamoTRIgine TAKE 1 & 1/2 TABLETS BY MOUTH DAILY.   testosterone cypionate 200 MG/ML injection Commonly known as: DEPOTESTOSTERONE CYPIONATE Inject 1.5 mL intramuscularly every 2 weeks.   triamcinolone cream 0.1 % Commonly known as: KENALOG   VITAMIN B 12 PO Take by mouth.          Objective:   Physical Exam BP (!) 146/69 (BP  Location: Left Arm, Patient Position: Sitting, Cuff Size: Normal)   Pulse 71   Temp (!) 96.4 F (35.8 C) (Temporal)   Resp 18   Ht 6\' 4"  (1.93 m)   Wt (!) 310 lb (140.6 kg)   SpO2 99%   BMI 37.73 kg/m  General:   Well developed, NAD, BMI noted.  HEENT:  Normocephalic . Face symmetric, atraumatic Lungs:  CTA B Normal respiratory effort, no intercostal retractions, no accessory muscle use. Heart: RRR,  no murmur.  Abdomen:  Not distended, soft, non-tender. No rebound or rigidity.   Skin: See pictures Lower extremities: See pictures DRE: No stools found, normal sphincter tone, prostate normal Neurologic:  alert & oriented X3.  Speech normal, gait appropriate for age and unassisted Psych--  Cognition and judgment appear intact.  Cooperative with normal attention span and concentration.  Behavior appropriate. No anxious or depressed appearing.       Assessment     Assessment Hyperglycemia  HTN-- lopressor Hyperlipidemia-- lipitor  Depression, ADD. Used to see Dr. , now sees [facility] Elevated LFTs Edema and  Stasis dermatitis  DJD OSA -- on CPAP Morbid obesity  Hypogonadism -- f/u pcp  PLAN:  Hyperglycemia: A healthy diet and physical activity encouraged, check A1c HTN: Currently on metoprolol, BP today 146/69, continue metoprolol, monitor BPs. Hyperlipidemia: On Lipitor, he is not fasting but we will go ahead and check a FLP Depression, ADD: Last visit with psychiatry 08/12/2019.  Today the patient reports he is doing well. Edema and stasis  dermatitis: On exam he has mild swelling at both legs, + hyperpigmentation.  See picture, at this point days seems to be his baseline skin appearance.  Encouraged to avoid injuries in that area and keep the skin moisturized. Hypogonadism: On HRT, RF sent, DRE normal, check testosterone levels, CBC and PSA. RTC 4 months  This visit occurred during the SARS-CoV-2 public health emergency.  Safety protocols were in place,  including screening questions prior to the visit, additional usage of staff PPE, and extensive cleaning of exam room while observing appropriate contact time as indicated for disinfecting solutions.

## 2019-08-21 LAB — TESTOSTERONE TOTAL,FREE,BIO, MALES
Albumin: 4 g/dL (ref 3.6–5.1)
Sex Hormone Binding: 30 nmol/L (ref 22–77)
Testosterone: 106 ng/dL — ABNORMAL LOW (ref 250–827)

## 2019-08-22 NOTE — Assessment & Plan Note (Signed)
Hyperglycemia: A healthy diet and physical activity encouraged, check A1c HTN: Currently on metoprolol, BP today 146/69, continue metoprolol, monitor BPs. Hyperlipidemia: On Lipitor, he is not fasting but we will go ahead and check a FLP Depression, ADD: Last visit with psychiatry 08/12/2019.  Today the patient reports he is doing well. Edema and stasis  dermatitis: On exam he has mild swelling at both legs, + hyperpigmentation.  See picture, at this point days seems to be his baseline skin appearance.  Encouraged to avoid injuries in that area and keep the skin moisturized. Hypogonadism: On HRT, RF sent, DRE normal, check testosterone levels, CBC and PSA. RTC 4 months

## 2019-09-03 ENCOUNTER — Telehealth: Payer: Self-pay | Admitting: *Deleted

## 2019-09-03 ENCOUNTER — Other Ambulatory Visit: Payer: Self-pay

## 2019-09-03 DIAGNOSIS — E291 Testicular hypofunction: Secondary | ICD-10-CM

## 2019-09-03 NOTE — Telephone Encounter (Signed)
Opened in error

## 2019-09-05 ENCOUNTER — Other Ambulatory Visit: Payer: Self-pay | Admitting: Psychiatry

## 2019-09-05 MED FILL — buPROPion HCL ER (XL) 150 M: 150 | 90 days supply | Qty: 90 | Fill #0

## 2019-09-05 MED FILL — SUBVENITE 150 MG TABS: 150 | 30 days supply | Qty: 45 | Fill #0

## 2019-09-19 ENCOUNTER — Other Ambulatory Visit: Payer: Self-pay | Admitting: Psychiatry

## 2019-09-19 MED FILL — AMPHETAMINE-DEXTROAMPHET ER: 20 | 30 days supply | Qty: 60 | Fill #0

## 2019-09-19 MED FILL — METOPROLOL TARTRATE 50 MG T: 50 | 30 days supply | Qty: 60 | Fill #0

## 2019-09-19 MED FILL — ATORVASTATIN 10 MG TABLET: 10 | 30 days supply | Qty: 30 | Fill #1

## 2019-09-20 MED FILL — SERTRALINE HCL 100 MG TAB: 100 | 30 days supply | Qty: 30 | Fill #0

## 2019-10-01 ENCOUNTER — Telehealth: Payer: Self-pay | Admitting: *Deleted

## 2019-10-01 NOTE — Telephone Encounter (Signed)
Pt is due for testosterone labs. Left message for pt to return my call. Needs to schedule lab appt any day before 10am.

## 2019-10-08 NOTE — Telephone Encounter (Signed)
Pt scheduled lab appointment for 10/10/19.

## 2019-10-10 ENCOUNTER — Other Ambulatory Visit: Payer: Self-pay

## 2019-12-04 MED FILL — ATORVASTATIN CALCIUM 10 MG: 10 | 30 days supply | Qty: 30 | Fill #3

## 2019-12-04 MED FILL — METOPROLOL TARTRATE 50 MG T: 50 | 30 days supply | Qty: 60 | Fill #2

## 2019-12-04 MED FILL — SERTRALINE HCL 100 MG TABS: 100 | 30 days supply | Qty: 30 | Fill #2

## 2019-12-08 ENCOUNTER — Other Ambulatory Visit: Payer: Self-pay | Admitting: Psychiatry

## 2019-12-08 DIAGNOSIS — F988 Other specified behavioral and emotional disorders with onset usually occurring in childhood and adolescence: Secondary | ICD-10-CM

## 2019-12-09 NOTE — Telephone Encounter (Signed)
Due back Sept

## 2019-12-10 MED FILL — AMPHETAMINE-DEXTROAMPHET ER: 20 | 30 days supply | Qty: 60 | Fill #0

## 2019-12-19 MED FILL — SUBVENITE 150 MG TABS: 150 | 30 days supply | Qty: 45 | Fill #2

## 2019-12-19 MED FILL — buPROPion HCL ER (XL) 150 M: 150 | 90 days supply | Qty: 90 | Fill #1

## 2019-12-22 ENCOUNTER — Ambulatory Visit: Payer: Self-pay | Admitting: Internal Medicine

## 2020-01-06 MED FILL — SERTRALINE HCL 100 MG TABS: 100 | 30 days supply | Qty: 30 | Fill #3

## 2020-01-06 MED FILL — ATORVASTATIN CALCIUM 10 MG: 10 | 30 days supply | Qty: 30 | Fill #4

## 2020-01-15 MED FILL — METOPROLOL TARTRATE 50 MG T: 50 | 30 days supply | Qty: 60 | Fill #3

## 2020-01-20 ENCOUNTER — Other Ambulatory Visit: Payer: Self-pay | Admitting: Psychiatry

## 2020-01-20 DIAGNOSIS — F988 Other specified behavioral and emotional disorders with onset usually occurring in childhood and adolescence: Secondary | ICD-10-CM

## 2020-01-20 NOTE — Telephone Encounter (Signed)
Apt 02/10/20

## 2020-01-21 MED FILL — AMPHETAMINE-DEXTROAMPHET ER: 20 | 30 days supply | Qty: 60 | Fill #0

## 2020-02-10 ENCOUNTER — Ambulatory Visit (INDEPENDENT_AMBULATORY_CARE_PROVIDER_SITE_OTHER): Payer: Self-pay | Admitting: Psychiatry

## 2020-02-10 ENCOUNTER — Other Ambulatory Visit: Payer: Self-pay | Admitting: Psychiatry

## 2020-02-10 ENCOUNTER — Encounter: Payer: Self-pay | Admitting: Psychiatry

## 2020-02-10 ENCOUNTER — Other Ambulatory Visit: Payer: Self-pay

## 2020-02-10 DIAGNOSIS — F39 Unspecified mood [affective] disorder: Secondary | ICD-10-CM

## 2020-02-10 DIAGNOSIS — F988 Other specified behavioral and emotional disorders with onset usually occurring in childhood and adolescence: Secondary | ICD-10-CM

## 2020-02-10 DIAGNOSIS — F5105 Insomnia due to other mental disorder: Secondary | ICD-10-CM

## 2020-02-10 DIAGNOSIS — F4010 Social phobia, unspecified: Secondary | ICD-10-CM

## 2020-02-10 MED ORDER — BUPROPION HCL ER (XL) 150 MG PO TB24: 150.0000 mg | ORAL_TABLET | Freq: Every day | ORAL | 1 refills | Status: DC

## 2020-02-10 MED ORDER — SERTRALINE HCL 100 MG PO TABS: 100.0000 mg | ORAL_TABLET | Freq: Every day | ORAL | 1 refills | Status: DC

## 2020-02-10 MED ORDER — AMPHETAMINE-DEXTROAMPHET ER 20 MG PO CP24: ORAL_CAPSULE | ORAL | 0 refills | Status: DC

## 2020-02-10 MED ORDER — AMPHETAMINE-DEXTROAMPHET ER 20 MG PO CP24: 20.0000 mg | ORAL_CAPSULE | Freq: Two times a day (BID) | ORAL | 0 refills | Status: DC

## 2020-02-10 MED FILL — SERTRALINE HCL 100 MG TABS: 100 | 90 days supply | Qty: 90 | Fill #0

## 2020-02-10 NOTE — Progress Notes (Signed)
James Boone [id number] 10/12/1960 59 y.o.    James Rinne, MD   Subjective:   Patient ID:  James Boone is a 59 y.o. (DOB [birth year]) male.  Chief Complaint:  Chief Complaint  Patient presents with  . Follow-up  . Depression  . Anxiety  . ADD    Depression        James Boone presents for follow-up of ADD and bipolar disorder.  Last visit was 08/12/19.  No meds were changed.  It looks like he is remained on the same med regimen for several years.  Still working Sales executive.  Work function is OK.  Short-handed.  These are the highest dosage of sertraline 100 and Wellbutrin 150 that he has ever taken.  Adderall XR still works for focus.  Takes all 40 mg in the morning.  Misses it if not taken.  No major concerns re: mental health nor meds. No sig changes except a little more down over M-in-law died last month after living with them. Pt reports that mood is good for the most part. No sig depression.  Situational anxiety.   Some days are harder. describes anxiety as Minimal. Anxiety symptoms include: Social Anxiety,. Pt reports some irregularity with 4-5 hours typical for years. Enough sleep.No specific interference.  Occ EMA.   No sleepers.  Occ drowsy only if still  . Pt reports that appetite is good. Pt reports that energy is good and good. Concentration is down slightly. Suicidal thoughts:  denied by patient.  No suicide attempts.  Denies mood swings lately.  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  Last sign depression usually situational and doesn't last long.  Past Psychiatric Medication Trials: Adderall, sertraline, Wellbutrin, lamotrigine  Review of Systems:  Review of Systems  Cardiovascular: Negative for palpitations.  Musculoskeletal: Positive for arthralgias.  Skin:       Episode cellulitis  Neurological: Negative for tremors and weakness.  Psychiatric/Behavioral: Positive for depression.    Medications: I have reviewed the patient's current  medications.  Current Outpatient Medications  Medication Sig Dispense Refill  . amphetamine-dextroamphetamine (ADDERALL XR) 20 MG 24 hr capsule Take 1 capsule (20 mg total) by mouth in the morning and at bedtime. 60 capsule 0  . [START ON 03/09/2020] amphetamine-dextroamphetamine (ADDERALL XR) 20 MG 24 hr capsule Take 1 capsule (20 mg total) by mouth in the morning and at bedtime. 60 capsule 0  . [START ON 04/06/2020] amphetamine-dextroamphetamine (ADDERALL XR) 20 MG 24 hr capsule TAKE 1 CAPSULE BY MOUTH EVERY MORNING AND EVERY AFTERNOON 60 capsule 0  . ascorbic acid (VITAMIN C) 500 MG tablet Take by mouth.    Marland Kitchen aspirin EC 81 MG tablet Take 81 mg by mouth daily.    Marland Kitchen atorvastatin (LIPITOR) 10 MG tablet Take 1 tablet (10 mg total) by mouth daily. 30 tablet 4  . B-D 3CC LUER-LOK SYR 23GX1-1/2 23G X 1-1/2" 3 ML MISC USE AS DIRECTED FOR INTRAMUSCULAR TESTOSTERONE INJECTIONS 100 each 11  . buPROPion (WELLBUTRIN XL) 150 MG 24 hr tablet Take 1 tablet (150 mg total) by mouth daily. 90 tablet 1  . Cyanocobalamin (VITAMIN B 12 PO) Take by mouth.    . metoprolol tartrate (LOPRESSOR) 50 MG tablet Take 1 tablet (50 mg total) by mouth 2 (two) times daily. 60 tablet 4  . Multiple Vitamin (MULTIVITAMIN ADULT PO) Take by mouth.    . sertraline (ZOLOFT) 100 MG tablet Take 1 tablet (100 mg total) by mouth daily. 90 tablet  1  . SUBVENITE 150 MG tablet TAKE 1 & 1/2 TABLETS BY MOUTH DAILY. 45 tablet 5  . testosterone cypionate (DEPOTESTOSTERONE CYPIONATE) 200 MG/ML injection Inject 1.5 mL intramuscularly every 2 weeks. 10 mL 4  . triamcinolone cream (KENALOG) 0.1 %   0   No current facility-administered medications for this visit.    Medication Side Effects: None except dry  Allergies: No Known Allergies  Past Medical History:  Diagnosis Date  . Abnormal liver function test   . Acquired cavovarus deformity of right foot 2016   Dr. Victorino Dike, using boot modification and [place] brace  . ADD (attention deficit  disorder)   . ADD (attention deficit disorder)   . Allergic rhinitis   . Arthritis   . Back pain    lower  . Complete rotator cuff tear of left shoulder 07/12/2017  . Depression   . Hemorrhoid   . Hypercholesteremia   . Hyperglycemia   . Hyperlipidemia   . Hypertension   . Hypogonadism male   . Peripheral edema   . Sleep apnea 2005   dx in 2005 , on CPAP setting of 13  . Varicose veins    both legs    Family History  Problem Relation Age of Onset  . Depression Sister        no suicide   . Stroke Father        ag ~ 54  . Coronary artery disease Father        smoker, MI and a CABG at 3 y/o  . Colon cancer Neg Hx   . Prostate cancer Neg Hx   . Diabetes Neg Hx     Social History   Socioeconomic History  . Marital status: Married    Spouse name: Not on file  . Number of children: 2  . Years of education: Not on file  . Highest education level: Not on file  Occupational History  . Occupation: [employer], sales   Tobacco Use  . Smoking status: Never Smoker  . Smokeless tobacco: Never Used  Vaping Use  . Vaping Use: Never used  Substance and Sexual Activity  . Alcohol use: Yes    Comment: socially   . Drug use: No  . Sexual activity: Yes  Other Topics Concern  . Not on file  Social History Narrative   Lives w/ wife, 1 child and mother in law       Social Determinants of Health   Financial Resource Strain:   . Difficulty of Paying Living Expenses: Not on file  Food Insecurity:   . Worried About Programme researcher, broadcasting/film/video in the Last Year: Not on file  . Ran Out of Food in the Last Year: Not on file  Transportation Needs:   . Lack of Transportation (Medical): Not on file  . Lack of Transportation (Non-Medical): Not on file  Physical Activity:   . Days of Exercise per Week: Not on file  . Minutes of Exercise per Session: Not on file  Stress:   . Feeling of Stress : Not on file  Social Connections:   . Frequency of Communication with Friends and  Family: Not on file  . Frequency of Social Gatherings with Friends and Family: Not on file  . Attends Religious Services: Not on file  . Active Member of Clubs or Organizations: Not on file  . Attends Banker Meetings: Not on file  . Marital Status: Not on file  Intimate Partner Violence:   .  Fear of Current or Ex-Partner: Not on file  . Emotionally Abused: Not on file  . Physically Abused: Not on file  . Sexually Abused: Not on file    Past Medical History, Surgical history, Social history, and Family history were reviewed and updated as appropriate.   Please see review of systems for further details on the patient's review from today.   Objective:   Physical Exam:  There were no vitals taken for this visit.  Physical Exam Constitutional:      General: He is not in acute distress.    Appearance: He is well-developed. He is obese.  Musculoskeletal:        General: Swelling present. No deformity.  Neurological:     Mental Status: He is alert and oriented to person, place, and time.     Cranial Nerves: No dysarthria.     Coordination: Coordination normal.  Psychiatric:        Attention and Perception: Attention and perception normal. He does not perceive auditory or visual hallucinations.        Mood and Affect: Mood is anxious. Mood is not depressed. Affect is blunt. Affect is not labile, angry or inappropriate.        Speech: Speech normal.        Behavior: Behavior normal. Behavior is cooperative.        Thought Content: Thought content normal. Thought content is not paranoid or delusional. Thought content does not include homicidal or suicidal ideation. Thought content does not include homicidal or suicidal plan.        Cognition and Memory: Cognition and memory normal.        Judgment: Judgment normal.     Comments: Insight intact. No delusions.  Hypoverbal     Lab Review:     Component Value Date/Time   NA 138 08/20/2019 1116   K 4.4 08/20/2019 1116    CL 103 08/20/2019 1116   CO2 30 08/20/2019 1116   GLUCOSE 133 (H) 08/20/2019 1116   BUN 24 (H) 08/20/2019 1116   CREATININE 0.96 08/20/2019 1116   CALCIUM 9.2 08/20/2019 1116   PROT 6.7 08/20/2019 1116   ALBUMIN 4.1 08/20/2019 1116   AST 27 08/20/2019 1116   ALT 29 08/20/2019 1116   ALKPHOS 114 08/20/2019 1116   BILITOT 0.4 08/20/2019 1116   GFRNONAA >90 03/18/2014 0125   GFRAA >90 03/18/2014 0125       Component Value Date/Time   WBC 5.9 08/20/2019 1116   RBC 5.12 08/20/2019 1116   HGB 14.8 08/20/2019 1116   HCT 44.4 08/20/2019 1116   PLT 233.0 08/20/2019 1116   MCV 86.8 08/20/2019 1116   MCH 28.7 03/18/2014 0125   MCHC 33.3 08/20/2019 1116   RDW 16.1 (H) 08/20/2019 1116   LYMPHSABS 1.8 08/20/2019 1116   MONOABS 0.6 08/20/2019 1116   EOSABS 0.2 08/20/2019 1116   BASOSABS 0.0 08/20/2019 1116    No results found for: POCLITH, LITHIUM   No results found for: PHENYTOIN, PHENOBARB, VALPROATE, CBMZ   .res Assessment: Plan:    Episodic mood disorder (HCC) - Plan: buPROPion (WELLBUTRIN XL) 150 MG 24 hr tablet, sertraline (ZOLOFT) 100 MG tablet  Social anxiety disorder - Plan: sertraline (ZOLOFT) 100 MG tablet  Attention deficit disorder (ADD) without hyperactivity - Plan: amphetamine-dextroamphetamine (ADDERALL XR) 20 MG 24 hr capsule, amphetamine-dextroamphetamine (ADDERALL XR) 20 MG 24 hr capsule, amphetamine-dextroamphetamine (ADDERALL XR) 20 MG 24 hr capsule, buPROPion (WELLBUTRIN XL) 150 MG 24 hr tablet  Insomnia due  to mental condition  Per Dr. Christena DeemMitchem's note's patient likely has Asberger's syndrome plus or minus social anxiety as well.  The patient is satisfied with his current medications.  His answers were brief to questions but he denied most psychiatric symptoms except for some situational anxiety and depressive symptoms.  The primary stressors are probably financial at this point.  Overall he is satisfied with his medication and does not want any medication  changes. Disc grief and lifestyle change after M in law died last month.  Discussed potential benefits, risks, and side effects of stimulants with patient to include increased heart rate, palpitations, insomnia, increased anxiety, increased irritability, or decreased appetite.  Instructed patient to contact office if experiencing any significant tolerability issues. Ran out of BP med and it's up today.  He plans to address it .  Will send in brief supply and hell call today.  He feels he is getting adequate duration out of the Adderall XR taking 2 each morning.  He does not have a significant crash.  No particular mood swings were noted.  He was cautioned that both sertraline and stimulants can cause mood swings and bipolar patients and to let us know if those occur.  No med changes today  Follow-up 6 months  Meredith Staggersarey Cottle MD, DFAPA  Please see After Visit Summary for patient specific instructions.  No future appointments.  No orders of the defined types were placed in this encounter.     -------------------------------

## 2020-02-16 ENCOUNTER — Other Ambulatory Visit: Payer: Self-pay | Admitting: Internal Medicine

## 2020-02-16 MED FILL — SUBVENITE 150 MG TABS: 150 | 30 days supply | Qty: 45 | Fill #3

## 2020-02-16 MED FILL — METOPROLOL TARTRATE 50 MG T: 50 | 30 days supply | Qty: 60 | Fill #4

## 2020-02-16 MED FILL — ATORVASTATIN CALCIUM 10 MG: 10 | 30 days supply | Qty: 30 | Fill #0

## 2020-03-02 ENCOUNTER — Telehealth: Payer: Self-pay | Admitting: Psychiatry

## 2020-03-02 MED FILL — AMPHETAMINE-DEXTROAMPHET ER: 20 | 30 days supply | Qty: 60 | Fill #0

## 2020-03-02 NOTE — Telephone Encounter (Signed)
[facility] called for clarification on Iam's Adderall RX's. [phone]

## 2020-03-02 NOTE — Telephone Encounter (Signed)
Clarification on 2 of the Adderall Rx's instructions were 1 in the am and 1 at bedtime. Changed to 1 in the am and 1 in the early afternoon.

## 2020-03-03 ENCOUNTER — Other Ambulatory Visit: Payer: Self-pay

## 2020-03-03 DIAGNOSIS — F988 Other specified behavioral and emotional disorders with onset usually occurring in childhood and adolescence: Secondary | ICD-10-CM

## 2020-03-03 NOTE — Progress Notes (Signed)
Thank you.  Instructions should be 1 in the morning and 1 in the afternoon as corrected.  Let me know if any further action needs to be taken.

## 2020-03-03 NOTE — Progress Notes (Signed)
[facility] called to clarify Rx's for Adderall XR 20 mg, 2 of the Rx's were written as 1 in the am and 1 at bedtime. Instructions changed to 1 in the am and 1 in the early afternoon.

## 2020-03-26 ENCOUNTER — Other Ambulatory Visit: Payer: Self-pay | Admitting: Internal Medicine

## 2020-03-26 DIAGNOSIS — I10 Essential (primary) hypertension: Secondary | ICD-10-CM

## 2020-03-26 MED FILL — METOPROLOL TARTRATE 50 MG T: 50 | 15 days supply | Qty: 30 | Fill #0

## 2020-03-26 MED FILL — ATORVASTATIN CALCIUM 10 MG: 10 | 15 days supply | Qty: 15 | Fill #0

## 2020-04-12 MED FILL — buPROPion HCL ER (XL) 150 M: 150 | 90 days supply | Qty: 90 | Fill #0

## 2020-04-12 MED FILL — AMPHETAMINE-DEXTROAMPHET ER: 20 | 30 days supply | Qty: 60 | Fill #0

## 2020-04-12 MED FILL — SUBVENITE 150 MG TABS: 150 | 30 days supply | Qty: 45 | Fill #4

## 2020-04-27 ENCOUNTER — Other Ambulatory Visit: Payer: Self-pay | Admitting: Internal Medicine

## 2020-04-27 DIAGNOSIS — I10 Essential (primary) hypertension: Secondary | ICD-10-CM

## 2020-04-27 MED FILL — METOPROLOL TARTRATE 50 MG T: 50 | 14 days supply | Qty: 14 | Fill #0

## 2020-04-27 NOTE — Telephone Encounter (Signed)
Requesting: testosterone 200mg /mL injection Contract: N/A UDS: N/A Last Visit: 08/20/2019 Next Visit: None scheduled Last Refill: 08/20/2019 #75mL and 4RF  Letter sent to Pt back in September 2021 to let him know he is overdue for visit. I sent a 15 day supply of metoprolol on 03/26/2020 w/ note overdue for visit. I sent a 7 day supply today (04/27/2020) informing again he is overdue for visit.   Please Advise

## 2020-04-28 ENCOUNTER — Other Ambulatory Visit: Payer: Self-pay | Admitting: Internal Medicine

## 2020-04-28 MED FILL — TESTOSTERONE CYP 200 MG/ML: 200 | 70 days supply | Qty: 10 | Fill #0

## 2020-04-28 NOTE — Telephone Encounter (Signed)
Molli Knock, is the end of the year, he may be very busy. Lets send in a medication for 2 months.  Please asked him to make an appointment in January.

## 2020-05-17 ENCOUNTER — Encounter: Payer: Self-pay | Admitting: Internal Medicine

## 2020-05-20 MED FILL — AMPHETAMINE-DEXTROAMPHET ER: 20 | 30 days supply | Qty: 60 | Fill #0

## 2020-06-05 MED FILL — SERTRALINE HCL 100 MG TABS: 100 | 90 days supply | Qty: 90 | Fill #1

## 2020-06-10 MED FILL — SUBVENITE 150 MG TABS: 150 | 30 days supply | Qty: 45 | Fill #5

## 2020-07-02 ENCOUNTER — Other Ambulatory Visit: Payer: Self-pay | Admitting: Psychiatry

## 2020-07-02 DIAGNOSIS — F988 Other specified behavioral and emotional disorders with onset usually occurring in childhood and adolescence: Secondary | ICD-10-CM

## 2020-07-09 ENCOUNTER — Telehealth: Payer: Self-pay | Admitting: Psychiatry

## 2020-07-09 NOTE — Telephone Encounter (Signed)
Patient called in stating that he needs a PA for his Adderall. Pharmacy [facility]. Has new ins information. [facility] [id number].

## 2020-07-12 MED FILL — ADDERALL XR 20 MG CAP SA: 20 | 30 days supply | Qty: 60 | Fill #0

## 2020-07-12 NOTE — Telephone Encounter (Signed)
Prior authorization submitted and approved for AMPHETAMINE-DEXTROAMPHETAMINE ER #[id number] effective 07/11/2020-07/12/2023 with [employer]

## 2020-08-02 ENCOUNTER — Encounter: Payer: Self-pay | Admitting: Internal Medicine

## 2020-08-10 ENCOUNTER — Ambulatory Visit: Payer: Self-pay | Admitting: Psychiatry

## 2020-08-12 ENCOUNTER — Other Ambulatory Visit (HOSPITAL_BASED_OUTPATIENT_CLINIC_OR_DEPARTMENT_OTHER): Payer: Self-pay

## 2020-08-16 ENCOUNTER — Ambulatory Visit: Payer: Self-pay | Admitting: Psychiatry

## 2020-08-17 ENCOUNTER — Other Ambulatory Visit: Payer: Self-pay | Admitting: Psychiatry

## 2020-08-17 DIAGNOSIS — F988 Other specified behavioral and emotional disorders with onset usually occurring in childhood and adolescence: Secondary | ICD-10-CM

## 2020-08-17 NOTE — Telephone Encounter (Signed)
review 

## 2020-08-18 MED FILL — SUBVENITE 150 MG TABS: 150 | 30 days supply | Qty: 45 | Fill #0

## 2020-08-18 MED FILL — ADDERALL XR 20 MG CAP SA: 20 | 30 days supply | Qty: 60 | Fill #0

## 2020-08-21 ENCOUNTER — Other Ambulatory Visit (HOSPITAL_COMMUNITY): Payer: Self-pay

## 2020-08-21 MED FILL — Bupropion HCl Tab ER 24HR 150 MG: ORAL | 30 days supply | Qty: 30 | Fill #0 | Status: AC

## 2020-08-22 ENCOUNTER — Other Ambulatory Visit (HOSPITAL_COMMUNITY): Payer: Self-pay

## 2020-08-31 ENCOUNTER — Other Ambulatory Visit (HOSPITAL_COMMUNITY): Payer: Self-pay

## 2020-09-30 ENCOUNTER — Other Ambulatory Visit: Payer: Self-pay | Admitting: Psychiatry

## 2020-09-30 ENCOUNTER — Other Ambulatory Visit (HOSPITAL_COMMUNITY): Payer: Self-pay

## 2020-09-30 DIAGNOSIS — F988 Other specified behavioral and emotional disorders with onset usually occurring in childhood and adolescence: Secondary | ICD-10-CM

## 2020-10-01 ENCOUNTER — Other Ambulatory Visit (HOSPITAL_COMMUNITY): Payer: Self-pay

## 2020-10-01 ENCOUNTER — Other Ambulatory Visit: Payer: Self-pay | Admitting: Psychiatry

## 2020-10-01 DIAGNOSIS — F988 Other specified behavioral and emotional disorders with onset usually occurring in childhood and adolescence: Secondary | ICD-10-CM

## 2020-10-01 MED ORDER — AMPHETAMINE-DEXTROAMPHET ER 20 MG PO CP24: 20.0000 mg | ORAL_CAPSULE | Freq: Two times a day (BID) | ORAL | 0 refills | Status: DC

## 2020-10-01 MED ORDER — ADDERALL XR 20 MG PO CP24
ORAL_CAPSULE | ORAL | 0 refills | Status: DC
  Filled 2020-10-01: qty 60, 30d supply, fill #0

## 2020-10-01 NOTE — Telephone Encounter (Signed)
Apt 6/21

## 2020-10-08 ENCOUNTER — Other Ambulatory Visit (HOSPITAL_COMMUNITY): Payer: Self-pay

## 2020-10-08 ENCOUNTER — Other Ambulatory Visit: Payer: Self-pay | Admitting: Psychiatry

## 2020-10-08 DIAGNOSIS — F4010 Social phobia, unspecified: Secondary | ICD-10-CM

## 2020-10-08 DIAGNOSIS — F39 Unspecified mood [affective] disorder: Secondary | ICD-10-CM

## 2020-10-08 MED ORDER — SERTRALINE HCL 100 MG PO TABS
ORAL_TABLET | Freq: Every day | ORAL | 0 refills | Status: DC
  Filled 2020-10-08: qty 30, 30d supply, fill #0

## 2020-10-08 MED FILL — Bupropion HCl Tab ER 24HR 150 MG: ORAL | 30 days supply | Qty: 30 | Fill #1 | Status: AC

## 2020-10-09 ENCOUNTER — Other Ambulatory Visit (HOSPITAL_COMMUNITY): Payer: Self-pay

## 2020-10-11 ENCOUNTER — Other Ambulatory Visit (HOSPITAL_COMMUNITY): Payer: Self-pay

## 2020-11-09 ENCOUNTER — Ambulatory Visit (INDEPENDENT_AMBULATORY_CARE_PROVIDER_SITE_OTHER): Payer: BC Managed Care – PPO | Admitting: Psychiatry

## 2020-11-09 ENCOUNTER — Other Ambulatory Visit (HOSPITAL_COMMUNITY): Payer: Self-pay

## 2020-11-09 ENCOUNTER — Encounter: Payer: Self-pay | Admitting: Psychiatry

## 2020-11-09 ENCOUNTER — Other Ambulatory Visit: Payer: Self-pay

## 2020-11-09 DIAGNOSIS — F39 Unspecified mood [affective] disorder: Secondary | ICD-10-CM

## 2020-11-09 DIAGNOSIS — F4010 Social phobia, unspecified: Secondary | ICD-10-CM | POA: Diagnosis not present

## 2020-11-09 DIAGNOSIS — I10 Essential (primary) hypertension: Secondary | ICD-10-CM

## 2020-11-09 DIAGNOSIS — F988 Other specified behavioral and emotional disorders with onset usually occurring in childhood and adolescence: Secondary | ICD-10-CM | POA: Diagnosis not present

## 2020-11-09 MED ORDER — BUPROPION HCL ER (XL) 150 MG PO TB24
ORAL_TABLET | Freq: Every day | ORAL | 1 refills | Status: DC
  Filled 2020-11-09 – 2020-11-20 (×3): qty 30, 30d supply, fill #0

## 2020-11-09 MED ORDER — AMPHETAMINE-DEXTROAMPHET ER 20 MG PO CP24: ORAL_CAPSULE | ORAL | 0 refills | Status: DC

## 2020-11-09 MED ORDER — ATORVASTATIN CALCIUM 10 MG PO TABS
ORAL_TABLET | ORAL | 0 refills | Status: DC
  Filled 2020-11-09: qty 30, 30d supply, fill #0

## 2020-11-09 MED ORDER — SERTRALINE HCL 100 MG PO TABS
ORAL_TABLET | Freq: Every day | ORAL | 0 refills | Status: DC
Start: 2020-11-09 — End: 2021-01-06
  Filled 2020-11-09: qty 30, 30d supply, fill #0

## 2020-11-09 MED ORDER — METOPROLOL TARTRATE 50 MG PO TABS
ORAL_TABLET | ORAL | 0 refills | Status: DC
  Filled 2020-11-09: qty 30, 15d supply, fill #0

## 2020-11-09 MED ORDER — LAMOTRIGINE 150 MG PO TABS
ORAL_TABLET | ORAL | 5 refills | Status: DC
  Filled 2020-11-09: qty 45, 30d supply, fill #0
  Filled 2021-02-14: qty 45, 30d supply, fill #1

## 2020-11-09 MED ORDER — ADDERALL XR 20 MG PO CP24: ORAL_CAPSULE | ORAL | 0 refills | Status: DC

## 2020-11-09 NOTE — Progress Notes (Signed)
James Boone [id number] [birth year] 60 y.o.    James Rinne, MD   Subjective:   Patient ID:  James Boone is a 60 y.o. (DOB [birth year]) male.  Chief Complaint:  Chief Complaint  Patient presents with   Episodic mood disorder (HCC)   Follow-up   ADHD    Depression       James Boone presents for follow-up of ADD and bipolar disorder.  visit 08/12/19.  No meds were changed.  It looks like he is remained on the same med regimen for several years.  02/10/2020 appointment with the following noted: No medications were changed. Still working Sales executive.  Work function is OK.  Short-handed. These are the highest dosage of sertraline 100 and Wellbutrin 150 that he has ever taken. Adderall XR still works for focus.  Takes all 40 mg in the morning.  Misses it if not taken. No major concerns re: mental health nor meds. No sig changes except a little more down over M-in-law died last month after living with them. Pt reports that mood is good for the most part. No sig depression.  Situational anxiety.   Some days are harder. describes anxiety as Minimal. Anxiety symptoms include: Social Anxiety,. Pt reports some irregularity with 4-5 hours typical for years. Enough sleep.No specific interference.  Occ EMA.   No sleepers.  Occ drowsy only if still  . Pt reports that appetite is good. Pt reports that energy is good and good. Concentration is down slightly. Suicidal thoughts:  denied by patient.  No suicide attempts.  Denies mood swings lately.  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  11/09/2020 appointment with the following noted: Doing OK.  Still working and going OK.  Less short handed but works 40 hours weekly. No unusual anxiety or depression.  Still benefits from meds including Adderall.   Sleep with some difficulty falling asleep but not unusual. Tea in AM and 2 mtn Dews a day.  Pending CPAP use bc had mask problem. No SE. Adderall costing $110/month now.  For 2 XR daily.  Last  sign depression usually situational and doesn't last long.  Past Psychiatric Medication Trials: Adderall, sertraline, Wellbutrin, lamotrigine  Review of Systems:  Review of Systems  Cardiovascular:  Negative for palpitations.  Musculoskeletal:  Positive for arthralgias.  Skin:        Episode cellulitis  Neurological:  Negative for tremors and weakness.   Medications: I have reviewed the patient's current medications.  Current Outpatient Medications  Medication Sig Dispense Refill   ascorbic acid (VITAMIN C) 500 MG tablet Take by mouth.     aspirin EC 81 MG tablet Take 81 mg by mouth daily.     B-D 3CC LUER-LOK SYR 23GX1-1/2 23G X 1-1/2" 3 ML MISC USE AS DIRECTED FOR INTRAMUSCULAR TESTOSTERONE INJECTIONS 100 each 11   Cyanocobalamin (VITAMIN B 12 PO) Take by mouth.     Multiple Vitamin (MULTIVITAMIN ADULT PO) Take by mouth.     ADDERALL XR 20 MG 24 hr capsule TAKE 1 CAPSULE BY MOUTH EVERY MORNING AND 1 CAPSULE EVERY AFTERNOON 60 capsule 0   [START ON 01/04/2021] amphetamine-dextroamphetamine (ADDERALL XR) 20 MG 24 hr capsule 1 each morning and afternoon 60 capsule 0   [START ON 12/07/2020] amphetamine-dextroamphetamine (ADDERALL XR) 20 MG 24 hr capsule 1 each morning and afternoon 60 capsule 0   atorvastatin (LIPITOR) 10 MG tablet TAKE 1 TABLET BY MOUTH ONCE A DAY (OVERDUE FOR OFFICE VISIT) 30 tablet 0  buPROPion (WELLBUTRIN XL) 150 MG 24 hr tablet TAKE 1 TABLET BY MOUTH ONCE DAILY 90 tablet 1   lamoTRIgine (LAMICTAL) 150 MG tablet TAKE 1&1/2 TABLETS BY MOUTH ONCE A DAY 45 tablet 5   metoprolol tartrate (LOPRESSOR) 50 MG tablet TAKE 1 TABLET BY MOUTH 2 TIMES DAILY. (7 DAY SUPPLY ONLY--OVERDUE FOR OFFICE VISIT) 30 tablet 0   sertraline (ZOLOFT) 100 MG tablet TAKE 1 TABLET BY MOUTH ONCE DAILY 30 tablet 0   testosterone cypionate (DEPOTESTOSTERONE CYPIONATE) 200 MG/ML injection INJECT 1.5 ML INTO THE MUSCLE EVERY 2 WEEKS **DISCARD VIAL AFTER SINGLE USE** 10 mL 1   triamcinolone cream  (KENALOG) 0.1 %  (Patient not taking: Reported on 11/09/2020)  0   No current facility-administered medications for this visit.    Medication Side Effects: None except dry  Allergies: Not on File  Past Medical History:  Diagnosis Date   Abnormal liver function test    Acquired cavovarus deformity of right foot 2016   Dr. Victorino Dike, using boot modification and [place] brace   ADD (attention deficit disorder)    ADD (attention deficit disorder)    Allergic rhinitis    Arthritis    Back pain    lower   Complete rotator cuff tear of left shoulder 07/12/2017   Depression    Hemorrhoid    Hypercholesteremia    Hyperglycemia    Hyperlipidemia    Hypertension    Hypogonadism male    Peripheral edema    Sleep apnea 2005   dx in 2005 , on CPAP setting of 13   Varicose veins    both legs    Family History  Problem Relation Age of Onset   Depression Sister        no suicide    Stroke Father        ag ~ 52   Coronary artery disease Father        smoker, MI and a CABG at 58 y/o   Colon cancer Neg Hx    Prostate cancer Neg Hx    Diabetes Neg Hx     Social History   Socioeconomic History   Marital status: Married    Spouse name: Not on file   Number of children: 2   Years of education: Not on file   Highest education level: Not on file  Occupational History   Occupation: [employer], sales   Tobacco Use   Smoking status: Never   Smokeless tobacco: Never  Vaping Use   Vaping Use: Never used  Substance and Sexual Activity   Alcohol use: Yes    Comment: socially    Drug use: No   Sexual activity: Yes  Other Topics Concern   Not on file  Social History Narrative   Lives w/ wife, 1 child and mother in law       Social Determinants of Health   Financial Resource Strain: Not on file  Food Insecurity: Not on file  Transportation Needs: Not on file  Physical Activity: Not on file  Stress: Not on file  Social Connections: Not on file  Intimate Partner  Violence: Not on file    Past Medical History, Surgical history, Social history, and Family history were reviewed and updated as appropriate.   Please see review of systems for further details on the patient's review from today.   Objective:   Physical Exam:  BP 138/86   Pulse 89   Physical Exam Constitutional:      General:  He is not in acute distress.    Appearance: He is well-developed. He is obese.  Musculoskeletal:        General: Swelling present. No deformity.  Neurological:     Mental Status: He is alert and oriented to person, place, and time.     Cranial Nerves: No dysarthria.     Coordination: Coordination normal.  Psychiatric:        Attention and Perception: Attention and perception normal. He does not perceive auditory or visual hallucinations.        Mood and Affect: Mood is anxious. Mood is not depressed. Affect is blunt. Affect is not labile, angry or inappropriate.        Speech: Speech normal.        Behavior: Behavior normal. Behavior is cooperative.        Thought Content: Thought content normal. Thought content is not paranoid or delusional. Thought content does not include homicidal or suicidal ideation. Thought content does not include homicidal or suicidal plan.        Cognition and Memory: Cognition and memory normal.        Judgment: Judgment normal.     Comments: Insight intact. No delusions.  Hypoverbal    Lab Review:     Component Value Date/Time   NA 138 08/20/2019 1116   K 4.4 08/20/2019 1116   CL 103 08/20/2019 1116   CO2 30 08/20/2019 1116   GLUCOSE 133 (H) 08/20/2019 1116   BUN 24 (H) 08/20/2019 1116   CREATININE 0.96 08/20/2019 1116   CALCIUM 9.2 08/20/2019 1116   PROT 6.7 08/20/2019 1116   ALBUMIN 4.1 08/20/2019 1116   AST 27 08/20/2019 1116   ALT 29 08/20/2019 1116   ALKPHOS 114 08/20/2019 1116   BILITOT 0.4 08/20/2019 1116   GFRNONAA >90 03/18/2014 0125   GFRAA >90 03/18/2014 0125       Component Value Date/Time   WBC  5.9 08/20/2019 1116   RBC 5.12 08/20/2019 1116   HGB 14.8 08/20/2019 1116   HCT 44.4 08/20/2019 1116   PLT 233.0 08/20/2019 1116   MCV 86.8 08/20/2019 1116   MCH 28.7 03/18/2014 0125   MCHC 33.3 08/20/2019 1116   RDW 16.1 (H) 08/20/2019 1116   LYMPHSABS 1.8 08/20/2019 1116   MONOABS 0.6 08/20/2019 1116   EOSABS 0.2 08/20/2019 1116   BASOSABS 0.0 08/20/2019 1116    No results found for: POCLITH, LITHIUM   No results found for: PHENYTOIN, PHENOBARB, VALPROATE, CBMZ   .res Assessment: Plan:    Attention deficit disorder (ADD) without hyperactivity - Plan: ADDERALL XR 20 MG 24 hr capsule, amphetamine-dextroamphetamine (ADDERALL XR) 20 MG 24 hr capsule, amphetamine-dextroamphetamine (ADDERALL XR) 20 MG 24 hr capsule, buPROPion (WELLBUTRIN XL) 150 MG 24 hr tablet  Episodic mood disorder (HCC) - Plan: buPROPion (WELLBUTRIN XL) 150 MG 24 hr tablet, sertraline (ZOLOFT) 100 MG tablet  Social anxiety disorder - Plan: sertraline (ZOLOFT) 100 MG tablet  Essential hypertension - Plan: metoprolol tartrate (LOPRESSOR) 50 MG tablet  Per Dr. Christena DeemMitchem's note's patient likely has Asberger's syndrome plus or minus social anxiety as well.  The patient is satisfied with his current medications.  His answers were brief to questions but he denied most psychiatric symptoms except for some situational anxiety and depressive symptoms.  The primary stressors are probably financial at this point.  Overall he is satisfied with his medication and does not want any medication changes.  Discussed potential benefits, risks, and side effects of stimulants with patient  to include increased heart rate, palpitations, insomnia, increased anxiety, increased irritability, or decreased appetite.  Instructed patient to contact office if experiencing any significant tolerability issues. Ran out of BP med and it's up today.  He plans to address it .  Will send in brief supply and hell call today.  He feels he is getting  adequate duration out of the Adderall XR taking 2 each morning.  He does not have a significant crash.  No particular mood swings were noted.  He was cautioned that both sertraline and stimulants can cause mood swings and bipolar patients and to let us know if those occur.  Disc good rx and will send Adderall XR tgo [facility]  No med changes today Stop caffeine after 3 pm. Explained half life of caffeine in detail.  Follow-up 6 months  Meredith Staggers MD, DFAPA  Please see After Visit Summary for patient specific instructions.  Future Appointments  Date Time Provider Department Center  11/30/2020  1:00 PM Wanda Plump, MD [facility]    No orders of the defined types were placed in this encounter.     -------------------------------

## 2020-11-12 ENCOUNTER — Other Ambulatory Visit (HOSPITAL_COMMUNITY): Payer: Self-pay

## 2020-11-13 ENCOUNTER — Other Ambulatory Visit (HOSPITAL_COMMUNITY): Payer: Self-pay

## 2020-11-15 ENCOUNTER — Other Ambulatory Visit: Payer: Self-pay

## 2020-11-15 ENCOUNTER — Other Ambulatory Visit (HOSPITAL_COMMUNITY): Payer: Self-pay

## 2020-11-15 ENCOUNTER — Telehealth: Payer: Self-pay | Admitting: Psychiatry

## 2020-11-15 DIAGNOSIS — F988 Other specified behavioral and emotional disorders with onset usually occurring in childhood and adolescence: Secondary | ICD-10-CM

## 2020-11-15 MED ORDER — ADDERALL XR 20 MG PO CP24: ORAL_CAPSULE | ORAL | 0 refills | Status: DC

## 2020-11-15 MED ORDER — AMPHETAMINE-DEXTROAMPHET ER 20 MG PO CP24: ORAL_CAPSULE | ORAL | 0 refills | Status: DC

## 2020-11-15 NOTE — Telephone Encounter (Signed)
James Boone called and said that his Adderall XR 20 mg was called into

## 2020-11-15 NOTE — Telephone Encounter (Signed)
[facility] on [address], [place]. James Boone said his Adderall XR generic was $425.00 at [facility] and $110.00 at [facility] and its thru [facility]. He would like to pay the $110.00 at [facility] since it is cheaper. His phone number is [phone].

## 2020-11-15 NOTE — Telephone Encounter (Signed)
Pended.

## 2020-11-15 NOTE — Telephone Encounter (Signed)
Next visit is 05/03/21. Requesting refill on Adderall XR 20 mg called to [facility] at [phone].

## 2020-11-20 ENCOUNTER — Other Ambulatory Visit (HOSPITAL_COMMUNITY): Payer: Self-pay

## 2020-11-30 ENCOUNTER — Other Ambulatory Visit: Payer: Self-pay

## 2020-11-30 ENCOUNTER — Encounter: Payer: Self-pay | Admitting: Internal Medicine

## 2020-11-30 ENCOUNTER — Ambulatory Visit (INDEPENDENT_AMBULATORY_CARE_PROVIDER_SITE_OTHER): Payer: BC Managed Care – PPO | Admitting: Internal Medicine

## 2020-11-30 VITALS — BP 122/76 | HR 58 | Temp 98.1°F | Resp 18 | Ht 76.0 in | Wt 293.0 lb

## 2020-11-30 DIAGNOSIS — E291 Testicular hypofunction: Secondary | ICD-10-CM

## 2020-11-30 DIAGNOSIS — R739 Hyperglycemia, unspecified: Secondary | ICD-10-CM

## 2020-11-30 DIAGNOSIS — Z0000 Encounter for general adult medical examination without abnormal findings: Secondary | ICD-10-CM

## 2020-11-30 DIAGNOSIS — Z125 Encounter for screening for malignant neoplasm of prostate: Secondary | ICD-10-CM | POA: Diagnosis not present

## 2020-11-30 DIAGNOSIS — E785 Hyperlipidemia, unspecified: Secondary | ICD-10-CM | POA: Diagnosis not present

## 2020-11-30 LAB — COMPREHENSIVE METABOLIC PANEL
ALT: 33 U/L (ref 0–53)
AST: 36 U/L (ref 0–37)
Albumin: 4.4 g/dL (ref 3.5–5.2)
Alkaline Phosphatase: 87 U/L (ref 39–117)
BUN: 24 mg/dL — ABNORMAL HIGH (ref 6–23)
CO2: 30 mEq/L (ref 19–32)
Calcium: 9.6 mg/dL (ref 8.4–10.5)
Chloride: 100 mEq/L (ref 96–112)
Creatinine, Ser: 1.18 mg/dL (ref 0.40–1.50)
GFR: 66.44 mL/min (ref >60.00)
Glucose, Bld: 74 mg/dL (ref 70–99)
Potassium: 4.4 mEq/L (ref 3.5–5.1)
Sodium: 138 mEq/L (ref 135–145)
Total Bilirubin: 0.7 mg/dL (ref 0.2–1.2)
Total Protein: 7.2 g/dL (ref 6.0–8.3)

## 2020-11-30 LAB — LIPID PANEL
Cholesterol: 147 mg/dL (ref 0–200)
HDL: 52 mg/dL (ref >39.00)
LDL Cholesterol: 68 mg/dL (ref 0–99)
NonHDL: 94.63
Total CHOL/HDL Ratio: 3
Triglycerides: 131 mg/dL (ref 0.0–149.0)
VLDL: 26.2 mg/dL (ref 0.0–40.0)

## 2020-11-30 LAB — CBC WITH DIFFERENTIAL/PLATELET
Basophils Absolute: 0.1 10*3/uL (ref 0.0–0.1)
Basophils Relative: 0.7 % (ref 0.0–3.0)
Eosinophils Absolute: 0.4 10*3/uL (ref 0.0–0.7)
Eosinophils Relative: 5.9 % — ABNORMAL HIGH (ref 0.0–5.0)
HCT: 45.2 % (ref 39.0–52.0)
Hemoglobin: 15.1 g/dL (ref 13.0–17.0)
Lymphocytes Relative: 29.5 % (ref 12.0–46.0)
Lymphs Abs: 2.2 10*3/uL (ref 0.7–4.0)
MCHC: 33.4 g/dL (ref 30.0–36.0)
MCV: 85.7 fl (ref 78.0–100.0)
Monocytes Absolute: 0.8 10*3/uL (ref 0.1–1.0)
Monocytes Relative: 11.1 % (ref 3.0–12.0)
Neutro Abs: 3.9 10*3/uL (ref 1.4–7.7)
Neutrophils Relative %: 52.8 % (ref 43.0–77.0)
Platelets: 207 10*3/uL (ref 150.0–400.0)
RBC: 5.27 Mil/uL (ref 4.22–5.81)
RDW: 17.5 % — ABNORMAL HIGH (ref 11.5–15.5)
WBC: 7.4 10*3/uL (ref 4.0–10.5)

## 2020-11-30 LAB — TSH: TSH: 3.77 u[IU]/mL (ref 0.35–5.50)

## 2020-11-30 LAB — TESTOSTERONE: Testosterone: 244.82 ng/dL — ABNORMAL LOW (ref 300.00–890.00)

## 2020-11-30 LAB — PSA: PSA: 1.09 ng/mL (ref 0.10–4.00)

## 2020-11-30 NOTE — Progress Notes (Signed)
Subjective:    Patient ID: James Boone, male    DOB: [birth year], 60 y.o.   MRN: [id number]  DOS:  11/30/2020 Type of visit - description: CPX  Routine CPX, no major concerns.  Review of Systems  A 14 point review of systems is negative    Past Medical History:  Diagnosis Date   Abnormal liver function test    Acquired cavovarus deformity of right foot 2016   Dr. Victorino Dike, using boot modification and [place] brace   ADD (attention deficit disorder)    ADD (attention deficit disorder)    Allergic rhinitis    Arthritis    Back pain    lower   Complete rotator cuff tear of left shoulder 07/12/2017   Depression    Hemorrhoid    Hypercholesteremia    Hyperglycemia    Hyperlipidemia    Hypertension    Hypogonadism male    Peripheral edema    Sleep apnea 2005   dx in 2005 , on CPAP setting of 13   Varicose veins    both legs    Past Surgical History:  Procedure Laterality Date   HERNIA REPAIR  2001   abdominal hernia   KNEE ARTHROSCOPY  05/2008   left, Dr.Geoffrey   KNEE ARTHROSCOPY  2008   right, Dr.Geoffrey   SHOULDER ARTHROSCOPY WITH ROTATOR CUFF REPAIR AND SUBACROMIAL DECOMPRESSION Left 07/12/2017   Procedure: LEFT SHOULDER ARTHROSCOPY DEBRIDEMENT, ACROMIOPLASTY, ROTATOR CUFF REPAIR;  Surgeon: Teryl Lucy, MD;  Location: [facility];  Service: Orthopedics;  Laterality: Left;   TOTAL ANKLE REPLACEMENT Right 2016   TOTAL KNEE ARTHROPLASTY Left 12/30/2012   Procedure: LEFT TOTAL KNEE ARTHROPLASTY;  Surgeon: Loanne Drilling, MD;  Location: [facility];  Service: Orthopedics;  Laterality: Left;   TOTAL KNEE ARTHROPLASTY Right 03/16/2014   Procedure: RIGHT TOTAL KNEE ARTHROPLASTY;  Surgeon: Loanne Drilling, MD;  Location: [facility];  Service: Orthopedics;  Laterality: Right;   VASECTOMY  1990   wisdom teeth extractiion     Social History   Socioeconomic History   Marital status: Married    Spouse name: Not on file   Number of children: 2   Years of education:  Not on file   Highest education level: Not on file  Occupational History   Occupation: [employer], sales   Tobacco Use   Smoking status: Never   Smokeless tobacco: Never  Vaping Use   Vaping Use: Never used  Substance and Sexual Activity   Alcohol use: Yes    Comment: socially    Drug use: No   Sexual activity: Yes  Other Topics Concern   Not on file  Social History Narrative   Lives w/ wife, 1 child     Social Determinants of Health   Financial Resource Strain: Not on file  Food Insecurity: Not on file  Transportation Needs: Not on file  Physical Activity: Not on file  Stress: Not on file  Social Connections: Not on file  Intimate Partner Violence: Not on file    Allergies as of 11/30/2020   No Known Allergies      Medication List        Accurate as of November 30, 2020 11:59 PM. If you have any questions, ask your nurse or doctor.          STOP taking these medications    atorvastatin 10 MG tablet Commonly known as: LIPITOR Stopped by: [name], MD   metoprolol tartrate 50 MG  tablet Commonly known as: LOPRESSOR Stopped by: [name], MD   triamcinolone cream 0.1 % Commonly known as: KENALOG Stopped by: [name], MD       TAKE these medications    amphetamine-dextroamphetamine 20 MG 24 hr capsule Commonly known as: Adderall XR Take 1 capsule by mouth each morning and afternoon Start taking on: December 07, 2020   Adderall XR 20 MG 24 hr capsule Generic drug: amphetamine-dextroamphetamine TAKE 1 CAPSULE BY MOUTH EVERY MORNING AND 1 CAPSULE EVERY AFTERNOON Start taking on: December 13, 2020   amphetamine-dextroamphetamine 20 MG 24 hr capsule Commonly known as: Adderall XR Take 1 capsule by mouth each morning and afternoon Start taking on: January 04, 2021   ascorbic acid 500 MG tablet Commonly known as: VITAMIN C Take by mouth.   aspirin EC 81 MG tablet Take 81 mg by mouth daily.   B-D 3CC LUER-LOK SYR 23GX1-1/2 23G X 1-1/2" 3 ML  Misc Generic drug: SYRINGE-NEEDLE (DISP) 3 ML USE AS DIRECTED FOR INTRAMUSCULAR TESTOSTERONE INJECTIONS   buPROPion 150 MG 24 hr tablet Commonly known as: WELLBUTRIN XL TAKE 1 TABLET BY MOUTH ONCE DAILY   lamoTRIgine 150 MG tablet Commonly known as: LAMICTAL TAKE 1&1/2 TABLETS BY MOUTH ONCE A DAY   MULTIVITAMIN ADULT PO Take by mouth.   sertraline 100 MG tablet Commonly known as: ZOLOFT TAKE 1 TABLET BY MOUTH ONCE DAILY   testosterone cypionate 200 MG/ML injection Commonly known as: DEPOTESTOSTERONE CYPIONATE INJECT 1.5 ML INTO THE MUSCLE EVERY 2 WEEKS **DISCARD VIAL AFTER SINGLE USE**   VITAMIN B 12 PO Take by mouth.           Objective:   Physical Exam BP 122/76 (BP Location: Left Arm, Patient Position: Sitting, Cuff Size: Normal)   Pulse (!) 58   Temp 98.1 F (36.7 C) (Oral)   Resp 18   Ht 6\' 4"  (1.93 m)   Wt 293 lb (132.9 kg)   SpO2 98%   BMI 35.67 kg/m  General: Well developed, NAD, BMI noted Neck: No  thyromegaly  HEENT:  Normocephalic . Face symmetric, atraumatic Lungs:  CTA B Normal respiratory effort, no intercostal retractions, no accessory muscle use. Heart: RRR,  no murmur.  Abdomen:  Not distended, soft, non-tender. No rebound or rigidity. DRE: Normal sphincter tone, no stools, prostate normal Lower extremities: no pretibial edema bilaterally  Skin: Hyperpigmented pretibial skin Neurologic:  alert & oriented X3.  Speech normal, gait appropriate for age and unassisted Strength symmetric and appropriate for age.  Psych: Cognition and judgment appear intact.  Cooperative with normal attention span and concentration.  Behavior appropriate. No anxious or depressed appearing.     Assessment       Assessment Hyperglycemia  HTN- Hyperlipidemia-- lipitor  Depression, ADD. Used to see Dr. , now sees [facility] Elevated LFTs Edema and  Stasis dermatitis  DJD OSA -- on CPAP Morbid obesity  Hypogonadism -- f/u pcp FH CAD father  , 36, smoker  PLAN:  Here for CPX Hyperglycemia: Check A1c HTN: Ran out of metoprolol, not taking it at this point, BP today is very good, heart rate in the 50s.  Plan: Monitor BPs at home, restart meds if needed Hyperlipidemia: Ran out of Lipitor, check labs, restart with results. Stasis dermatitis: Hyperpigmented the skin at LE noted.  Observation. OSA: Good compliance with CPAP Hypogonadism: Good compliance, checking labs RTC 4 months   This visit occurred during the SARS-CoV-2 public health emergency.  Safety protocols were in place, including screening  questions prior to the visit, additional usage of staff PPE, and extensive cleaning of exam room while observing appropriate contact time as indicated for disinfecting solutions.

## 2020-11-30 NOTE — Patient Instructions (Signed)
Check the  blood pressure weekly BP GOAL is between 110/65 and  135/85. If it is consistently higher or lower, let me know  Return the stool test to the front desk  GO TO THE LAB : Get the blood work     GO TO THE FRONT DESK, PLEASE SCHEDULE YOUR APPOINTMENTS Come back for a checkup in 4 months     "Living will", "Health Care Power of attorney": Advanced care planning  (If you already have a living will or healthcare power of attorney, please bring the copy to be scanned in your chart.)  Advance care planning is a process that supports adults in  understanding and sharing their preferences regarding future medical care.   The patient's preferences are recorded in documents called Advance Directives.    Advanced directives are completed (and can be modified at any time) while the patient is in full mental capacity.   The documentation should be available at all times to the patient, the family and the healthcare providers.  Bring in a copy to be scanned in your chart is an excellent idea and is recommended   This legal documents direct treatment decision making and/or appoint a surrogate to make the decision if the patient is not capable to do so.    Advance directives can be documented in many types of formats,  documents have names such as:  Lliving will  Durable power of attorney for healthcare (healthcare proxy or healthcare power of attorney)  Combined directives  Physician orders for life-sustaining treatment    More information at:  [email or web address]

## 2020-12-01 ENCOUNTER — Encounter: Payer: Self-pay | Admitting: Internal Medicine

## 2020-12-01 NOTE — Assessment & Plan Note (Signed)
Here for CPX Hyperglycemia: Check A1c HTN: Ran out of metoprolol, not taking it at this point, BP today is very good, heart rate in the 50s.  Plan: Monitor BPs at home, restart meds if needed Hyperlipidemia: Ran out of Lipitor, check labs, restart with results. Stasis dermatitis: Hyperpigmented the skin at LE noted.  Observation. OSA: Good compliance with CPAP Hypogonadism: Good compliance, checking labs RTC 4 months

## 2020-12-01 NOTE — Assessment & Plan Note (Signed)
-  Td 2013 -Shingrix: declines  -COVID VAX x3, booster rec. - Flu shot recommended every year - (+) FH CAD on ASA -CCS: 08-2009 colonoscopy, normal, due for a colonoscopy, cost is an issue, we agreed on IFOB -prostate cancer screening: On HRT, DRE wnl, check psa -labs: CMP, FLP, CBC, total testosterone, TSH, PSA, iFOB, A1c -Discussed healthy diet, exercise  -POA: Discussed with patient

## 2020-12-02 ENCOUNTER — Other Ambulatory Visit (HOSPITAL_COMMUNITY): Payer: Self-pay

## 2020-12-02 ENCOUNTER — Other Ambulatory Visit: Payer: Self-pay | Admitting: Internal Medicine

## 2020-12-02 MED ORDER — TESTOSTERONE CYPIONATE 200 MG/ML IM SOLN
INTRAMUSCULAR | 2 refills | Status: DC
  Filled 2020-12-02: qty 10, 70d supply, fill #0

## 2020-12-02 MED ORDER — ATORVASTATIN CALCIUM 10 MG PO TABS: 10.0000 mg | ORAL_TABLET | Freq: Every day | ORAL | 0 refills | Status: DC

## 2020-12-02 NOTE — Addendum Note (Signed)
Addended byConrad  D on: 12/02/2020 07:47 AM   Modules accepted: Orders

## 2020-12-03 ENCOUNTER — Other Ambulatory Visit (INDEPENDENT_AMBULATORY_CARE_PROVIDER_SITE_OTHER): Payer: BC Managed Care – PPO

## 2020-12-03 ENCOUNTER — Other Ambulatory Visit (HOSPITAL_COMMUNITY): Payer: Self-pay

## 2020-12-03 DIAGNOSIS — Z0000 Encounter for general adult medical examination without abnormal findings: Secondary | ICD-10-CM | POA: Diagnosis not present

## 2020-12-03 LAB — FECAL OCCULT BLOOD, IMMUNOCHEMICAL: Fecal Occult Bld: NEGATIVE

## 2020-12-10 ENCOUNTER — Other Ambulatory Visit (HOSPITAL_COMMUNITY): Payer: Self-pay

## 2020-12-10 MED ORDER — "BD LUER-LOK SYRINGE 23G X 1-1/2"" 3 ML MISC"
0 refills | Status: DC
  Filled 2020-12-10: qty 6, 30d supply, fill #0
  Filled 2021-03-22 (×2): qty 6, 30d supply, fill #1
  Filled 2021-08-30: qty 6, 30d supply, fill #2

## 2021-01-06 ENCOUNTER — Other Ambulatory Visit: Payer: Self-pay | Admitting: Psychiatry

## 2021-01-06 ENCOUNTER — Other Ambulatory Visit (HOSPITAL_COMMUNITY): Payer: Self-pay

## 2021-01-06 DIAGNOSIS — I10 Essential (primary) hypertension: Secondary | ICD-10-CM

## 2021-01-06 DIAGNOSIS — F39 Unspecified mood [affective] disorder: Secondary | ICD-10-CM

## 2021-01-06 DIAGNOSIS — F4010 Social phobia, unspecified: Secondary | ICD-10-CM

## 2021-01-06 MED ORDER — SERTRALINE HCL 100 MG PO TABS
ORAL_TABLET | Freq: Every day | ORAL | 2 refills | Status: DC
  Filled 2021-01-06: qty 30, 30d supply, fill #0
  Filled 2021-02-14: qty 30, 30d supply, fill #1
  Filled 2021-03-22: qty 30, 30d supply, fill #2

## 2021-01-07 ENCOUNTER — Other Ambulatory Visit (HOSPITAL_COMMUNITY): Payer: Self-pay

## 2021-01-08 ENCOUNTER — Encounter: Payer: Self-pay | Admitting: Internal Medicine

## 2021-01-25 ENCOUNTER — Other Ambulatory Visit: Payer: Self-pay

## 2021-01-25 ENCOUNTER — Ambulatory Visit (INDEPENDENT_AMBULATORY_CARE_PROVIDER_SITE_OTHER): Payer: BC Managed Care – PPO | Admitting: Family

## 2021-01-25 ENCOUNTER — Encounter: Payer: Self-pay | Admitting: Family

## 2021-01-25 VITALS — BP 152/92 | HR 68 | Temp 98.4°F | Ht 76.0 in | Wt 285.0 lb

## 2021-01-25 DIAGNOSIS — L03119 Cellulitis of unspecified part of limb: Secondary | ICD-10-CM | POA: Diagnosis not present

## 2021-01-25 MED ORDER — DOXYCYCLINE HYCLATE 100 MG PO TABS: 100.0000 mg | ORAL_TABLET | Freq: Two times a day (BID) | ORAL | 0 refills | Status: DC

## 2021-01-25 NOTE — Progress Notes (Signed)
James Boone is a 60 y.o. male with the following history as recorded in Epic[employer]:  Patient Active Problem List   Diagnosis Date Noted   Stasis dermatitis of both legs 11/25/2018   Morbid obesity (HCC) 05/01/2018   Complete rotator cuff tear of left shoulder 07/12/2017   Shoulder injury, left, initial encounter 06/01/2017   PCP NOTES >>>>>>>>>>>>>>>>>>>>>>>> 10/04/2016   Edema-- R>>L 08/29/2013   Hyponatremia 12/31/2012   DJD (degenerative joint disease) 10/18/2011   Annual physical exam 04/17/2011   Hyperglycemia    Hyperlipidemia 08/28/2007   Hypogonadism in male 01/02/2007   LIVER FUNCTION TESTS, ABNORMAL 01/02/2007   Depression  10/31/2006   Attention deficit disorder--Dr. Nolen Mu 10/31/2006   Essential hypertension 10/31/2006   ALLERGIC RHINITIS 10/31/2006   SLEEP APNEA 10/31/2006    Current Outpatient Medications  Medication Sig Dispense Refill   ascorbic acid (VITAMIN C) 500 MG tablet Take by mouth.     aspirin EC 81 MG tablet Take 81 mg by mouth daily.     atorvastatin (LIPITOR) 10 MG tablet Take 1 tablet (10 mg total) by mouth daily. 90 tablet 0   B-D 3CC LUER-LOK SYR 23GX1-1/2 23G X 1-1/2" 3 ML MISC USE AS DIRECTED FOR INTRAMUSCULAR TESTOSTERONE INJECTIONS 100 each 11   buPROPion (WELLBUTRIN XL) 150 MG 24 hr tablet TAKE 1 TABLET BY MOUTH ONCE DAILY 90 tablet 1   Cyanocobalamin (VITAMIN B 12 PO) Take by mouth.     doxycycline (VIBRA-TABS) 100 MG tablet Take 1 tablet (100 mg total) by mouth 2 (two) times daily. 20 tablet 0   lamoTRIgine (LAMICTAL) 150 MG tablet TAKE 1&1/2 TABLETS BY MOUTH ONCE A DAY 45 tablet 5   Multiple Vitamin (MULTIVITAMIN ADULT PO) Take by mouth.     sertraline (ZOLOFT) 100 MG tablet TAKE 1 TABLET BY MOUTH ONCE DAILY 30 tablet 2   SYRINGE-NEEDLE, DISP, 3 ML (B-D 3CC LUER-LOK SYR 23GX1-1/2) 23G X 1-1/2" 3 ML MISC Use as directed for testosterone injections 100 each 0   testosterone cypionate (DEPOTESTOSTERONE CYPIONATE) 200 MG/ML injection INJECT 1.5  ML INTO THE MUSCLE EVERY 2 WEEKS **DISCARD VIAL AFTER SINGLE USE** 10 mL 2   ADDERALL XR 20 MG 24 hr capsule TAKE 1 CAPSULE BY MOUTH EVERY MORNING AND 1 CAPSULE EVERY AFTERNOON (Patient not taking: Reported on 01/25/2021) 60 capsule 0   amphetamine-dextroamphetamine (ADDERALL XR) 20 MG 24 hr capsule Take 1 capsule by mouth each morning and afternoon (Patient not taking: Reported on 01/25/2021) 60 capsule 0   amphetamine-dextroamphetamine (ADDERALL XR) 20 MG 24 hr capsule Take 1 capsule by mouth each morning and afternoon (Patient not taking: Reported on 01/25/2021) 60 capsule 0   No current facility-administered medications for this visit.    Allergies: Patient has no known allergies.  Past Medical History:  Diagnosis Date   Abnormal liver function test    Acquired cavovarus deformity of right foot 2016   Dr. Victorino Dike, using boot modification and [place] brace   ADD (attention deficit disorder)    ADD (attention deficit disorder)    Allergic rhinitis    Arthritis    Back pain    lower   Complete rotator cuff tear of left shoulder 07/12/2017   Depression    Hemorrhoid    Hypercholesteremia    Hyperglycemia    Hyperlipidemia    Hypertension    Hypogonadism male    Peripheral edema    Sleep apnea 2005   dx in 2005 , on CPAP setting of 13  Varicose veins    both legs    Past Surgical History:  Procedure Laterality Date   HERNIA REPAIR  2001   abdominal hernia   KNEE ARTHROSCOPY  05/2008   left, Dr.Geoffrey   KNEE ARTHROSCOPY  2008   right, Dr.Geoffrey   SHOULDER ARTHROSCOPY WITH ROTATOR CUFF REPAIR AND SUBACROMIAL DECOMPRESSION Left 07/12/2017   Procedure: LEFT SHOULDER ARTHROSCOPY DEBRIDEMENT, ACROMIOPLASTY, ROTATOR CUFF REPAIR;  Surgeon: Teryl Lucy, MD;  Location: [facility];  Service: Orthopedics;  Laterality: Left;   TOTAL ANKLE REPLACEMENT Right 2016   TOTAL KNEE ARTHROPLASTY Left 12/30/2012   Procedure: LEFT TOTAL KNEE ARTHROPLASTY;  Surgeon: Loanne Drilling,  MD;  Location: [facility];  Service: Orthopedics;  Laterality: Left;   TOTAL KNEE ARTHROPLASTY Right 03/16/2014   Procedure: RIGHT TOTAL KNEE ARTHROPLASTY;  Surgeon: Loanne Drilling, MD;  Location: [facility];  Service: Orthopedics;  Laterality: Right;   VASECTOMY  1990   wisdom teeth extractiion      Family History  Problem Relation Age of Onset   Depression Sister        no suicide    Stroke Father        ag ~ 63   Coronary artery disease Father        smoker, MI and a CABG at 52 y/o   Colon cancer Neg Hx    Prostate cancer Neg Hx    Diabetes Neg Hx     Social History   Tobacco Use   Smoking status: Never   Smokeless tobacco: Never  Substance Use Topics   Alcohol use: Yes    Comment: socially     Subjective:  Concerned for bilateral redness/ swelling and draining of lower legs x 1 year- left is worse than right; symptoms have been present x 1 year- notes that left leg became worse in the past year.     Objective:  Vitals:   01/25/21 1423  BP: (!) 152/92  Pulse: 68  Temp: 98.4 F (36.9 C)  TempSrc: Oral  SpO2: 98%  Weight: 285 lb (129.3 kg)  Height: 6\' 4"  (1.93 m)    General: Well developed, well nourished, in no acute distress  Skin : Warm and dry.  Head: Normocephalic and atraumatic  Eyes: Sclera and conjunctiva clear; pupils round and reactive to light; extraocular movements intact  Ears: External normal; canals clear; tympanic membranes normal  Oropharynx: Pink, supple. No suspicious lesions  Neck: Supple without thyromegaly, adenopathy  Lungs: Respirations unlabored;  Extremities: No edema, cyanosis, clubbing; erythema, drainage noted on left leg;  Vessels: Symmetric bilaterally  Neurologic: Alert and oriented; speech intact; face symmetrical; moves all extremities well; CNII-XII intact without focal deficit  Assessment:  1. Cellulitis of lower extremity, unspecified laterality     Plan:  Needs wound specialist and vascular specialist; in the interim, Rx for  Doxycycline- take as directed; referrals updated;   This visit occurred during the SARS-CoV-2 public health emergency.  Safety protocols were in place, including screening questions prior to the visit, additional usage of staff PPE, and extensive cleaning of exam room while observing appropriate contact time as indicated for disinfecting solutions.    No follow-ups on file.  Orders Placed This Encounter  Procedures   Ambulatory referral to Wound Clinic    Referral Priority:   Routine    Referral Type:   Consultation    Referral Reason:   Specialty Services Required    Requested Specialty:   Wound Care  Number of Visits Requested:   1   Ambulatory referral to Vascular Surgery    Referral Priority:   Routine    Referral Type:   Surgical    Referral Reason:   Specialty Services Required    Requested Specialty:   Vascular Surgery    Number of Visits Requested:   1    Requested Prescriptions   Signed Prescriptions Disp Refills   doxycycline (VIBRA-TABS) 100 MG tablet 20 tablet 0    Sig: Take 1 tablet (100 mg total) by mouth 2 (two) times daily.

## 2021-02-14 ENCOUNTER — Ambulatory Visit (HOSPITAL_COMMUNITY)
Admission: RE | Admit: 2021-02-14 | Discharge: 2021-02-14 | Disposition: A | Discharge status: Home / Self Care | Payer: BC Managed Care – PPO | Source: Ambulatory Visit | Attending: Internal Medicine | Admitting: Internal Medicine

## 2021-02-14 ENCOUNTER — Other Ambulatory Visit: Payer: Self-pay

## 2021-02-14 ENCOUNTER — Other Ambulatory Visit (HOSPITAL_COMMUNITY): Payer: Self-pay

## 2021-02-14 DIAGNOSIS — M7989 Other specified soft tissue disorders: Secondary | ICD-10-CM

## 2021-02-23 ENCOUNTER — Other Ambulatory Visit: Payer: Self-pay | Admitting: Internal Medicine

## 2021-03-01 ENCOUNTER — Ambulatory Visit (INDEPENDENT_AMBULATORY_CARE_PROVIDER_SITE_OTHER): Payer: BC Managed Care – PPO | Admitting: Physician Assistant

## 2021-03-01 ENCOUNTER — Other Ambulatory Visit: Payer: Self-pay

## 2021-03-01 VITALS — BP 137/79 | HR 71 | Temp 98.1°F | Resp 20 | Ht 76.0 in | Wt 275.0 lb

## 2021-03-01 DIAGNOSIS — I872 Venous insufficiency (chronic) (peripheral): Secondary | ICD-10-CM | POA: Diagnosis not present

## 2021-03-01 NOTE — Progress Notes (Signed)
[facility] [place]   Reason for referral: Swollen/ discolored  legs, with history of left medial ankle non healing wound.  History of Present Illness  James Boone is a 60 y.o. male who presents with chief complaint: swollen leg.  Patient notes, onset of swelling several years ago, associated with prolonged sitting and standing.  He has recently healed a left medial ankle wound that has been present for on/off for 2.5 years.The patient has had no history of DVT, no history of varicose vein, positive history of venous stasis ulcers, no history of  Lymphedema and positive history of skin changes in lower legs.  There is  family history of venous disorders.  The patient has  used compression stockings in the past.    Past Medical History:  Diagnosis Date   Abnormal liver function test    Acquired cavovarus deformity of right foot 2016   Dr. Victorino Dike, using boot modification and [place] brace   ADD (attention deficit disorder)    ADD (attention deficit disorder)    Allergic rhinitis    Arthritis    Back pain    lower   Complete rotator cuff tear of left shoulder 07/12/2017   Depression    Hemorrhoid    Hypercholesteremia    Hyperglycemia    Hyperlipidemia    Hypertension    Hypogonadism male    Peripheral edema    Sleep apnea 2005   dx in 2005 , on CPAP setting of 13   Varicose veins    both legs    Past Surgical History:  Procedure Laterality Date   HERNIA REPAIR  2001   abdominal hernia   KNEE ARTHROSCOPY  05/2008   left, Dr.Geoffrey   KNEE ARTHROSCOPY  2008   right, Dr.Geoffrey   SHOULDER ARTHROSCOPY WITH ROTATOR CUFF REPAIR AND SUBACROMIAL DECOMPRESSION Left 07/12/2017   Procedure: LEFT SHOULDER ARTHROSCOPY DEBRIDEMENT, ACROMIOPLASTY, ROTATOR CUFF REPAIR;  Surgeon: Teryl Lucy, MD;  Location: [facility];  Service: Orthopedics;  Laterality: Left;   TOTAL ANKLE REPLACEMENT Right 2016   TOTAL KNEE ARTHROPLASTY Left 12/30/2012    Procedure: LEFT TOTAL KNEE ARTHROPLASTY;  Surgeon: Loanne Drilling, MD;  Location: [facility];  Service: Orthopedics;  Laterality: Left;   TOTAL KNEE ARTHROPLASTY Right 03/16/2014   Procedure: RIGHT TOTAL KNEE ARTHROPLASTY;  Surgeon: Loanne Drilling, MD;  Location: [facility];  Service: Orthopedics;  Laterality: Right;   VASECTOMY  1990   wisdom teeth extractiion      Social History   Socioeconomic History   Marital status: Married    Spouse name: Not on file   Number of children: 2   Years of education: Not on file   Highest education level: Not on file  Occupational History   Occupation: [employer], sales   Tobacco Use   Smoking status: Never   Smokeless tobacco: Never  Vaping Use   Vaping Use: Never used  Substance and Sexual Activity   Alcohol use: Yes    Comment: socially    Drug use: No   Sexual activity: Yes  Other Topics Concern   Not on file  Social History Narrative   Lives w/ wife, 1 child     Social Determinants of Health   Financial Resource Strain: Not on file  Food Insecurity: Not on file  Transportation Needs: Not on file  Physical Activity: Not on file  Stress: Not on file  Social Connections: Not on file  Intimate Partner Violence: Not  on file    Family History  Problem Relation Age of Onset   Depression Sister        no suicide    Stroke Father        ag ~ 69   Coronary artery disease Father        smoker, MI and a CABG at 42 y/o   Colon cancer Neg Hx    Prostate cancer Neg Hx    Diabetes Neg Hx     Current Outpatient Medications on File Prior to Visit  Medication Sig Dispense Refill   amphetamine-dextroamphetamine (ADDERALL XR) 20 MG 24 hr capsule Take 1 capsule by mouth each morning and afternoon 60 capsule 0   amphetamine-dextroamphetamine (ADDERALL XR) 20 MG 24 hr capsule Take 1 capsule by mouth each morning and afternoon 60 capsule 0   ascorbic acid (VITAMIN C) 500 MG tablet Take by mouth.     aspirin EC 81 MG tablet Take 81 mg by  mouth daily.     atorvastatin (LIPITOR) 10 MG tablet TAKE 1 TABLET BY MOUTH EVERY DAY 90 tablet 0   B-D 3CC LUER-LOK SYR 23GX1-1/2 23G X 1-1/2" 3 ML MISC USE AS DIRECTED FOR INTRAMUSCULAR TESTOSTERONE INJECTIONS 100 each 11   buPROPion (WELLBUTRIN XL) 150 MG 24 hr tablet TAKE 1 TABLET BY MOUTH ONCE DAILY 90 tablet 1   Cyanocobalamin (VITAMIN B 12 PO) Take by mouth.     doxycycline (VIBRA-TABS) 100 MG tablet Take 1 tablet (100 mg total) by mouth 2 (two) times daily. 20 tablet 0   lamoTRIgine (LAMICTAL) 150 MG tablet TAKE 1&1/2 TABLETS BY MOUTH ONCE A DAY 45 tablet 5   Multiple Vitamin (MULTIVITAMIN ADULT PO) Take by mouth.     sertraline (ZOLOFT) 100 MG tablet TAKE 1 TABLET BY MOUTH ONCE DAILY 30 tablet 2   SYRINGE-NEEDLE, DISP, 3 ML (B-D 3CC LUER-LOK SYR 23GX1-1/2) 23G X 1-1/2" 3 ML MISC Use as directed for testosterone injections 100 each 0   testosterone cypionate (DEPOTESTOSTERONE CYPIONATE) 200 MG/ML injection INJECT 1.5 ML INTO THE MUSCLE EVERY 2 WEEKS **DISCARD VIAL AFTER SINGLE USE** 10 mL 2   No current facility-administered medications on file prior to visit.    Allergies as of 03/01/2021   (No Known Allergies)     ROS:   General:  No weight loss, Fever, chills  HEENT: No recent headaches, no nasal bleeding, no visual changes, no sore throat  Neurologic: No dizziness, blackouts, seizures. No recent symptoms of stroke or mini- stroke. No recent episodes of slurred speech, or temporary blindness.  Cardiac: No recent episodes of chest pain/pressure, no shortness of breath at rest.  No shortness of breath with exertion.  Denies history of atrial fibrillation or irregular heartbeat  Vascular: No history of rest pain in feet.  No history of claudication.  positive history of non-healing ulcer, No history of DVT   Pulmonary: No home oxygen, no productive cough, no hemoptysis,  No asthma or wheezing  Musculoskeletal:  [ ]  Arthritis, [ ]  Low back pain,  [ x] Joint  pain  Hematologic:No history of hypercoagulable state.  No history of easy bleeding.  No history of anemia  Gastrointestinal: No hematochezia or melena,  No gastroesophageal reflux, no trouble swallowing  Urinary: [ ]  chronic Kidney disease, [ ]  on HD - [ ]  MWF or [ ]  TTHS, [ ]  Burning with urination, [ ]  Frequent urination, [ ]  Difficulty urinating;   Skin: No rashes  Psychological: No history of anxiety,  No history of depression  Physical Examination  Vitals:   03/01/21 1345  BP: 137/79  Pulse: 71  Resp: 20  Temp: 98.1 F (36.7 C)  TempSrc: Temporal  SpO2: 95%  Weight: 275 lb (124.7 kg)  Height: 6\' 4"  (1.93 m)    Body mass index is 33.47 kg/m.  General:  Alert and oriented, no acute distress HEENT: Normal Neck: No bruit or JVD Pulmonary: Clear to auscultation bilaterally Cardiac: Regular Rate and Rhythm without murmur Abdomen: Soft, non-tender, non-distended, no mass, no scars Skin: No rash     Extremity Pulses:  2+ radial, brachial, femoral, dorsalis pedis, posterior tibial pulses bilaterally Musculoskeletal: B feet deformity with hammer toes and right ankle injuries, left lateral foot lipoma no apparent  edema in B LE on exam today. Neurologic: Upper and lower extremity motor 5/5 and symmetric  DATA:    +--------------+---------+------+-----------+------------+----------------+   LEFT          Reflux NoRefluxReflux TimeDiameter cmsComments                                    Yes                                             +--------------+---------+------+-----------+------------+----------------+   CFV                     yes   >1 second                                 +--------------+---------+------+-----------+------------+----------------+   FV mid        no                                                        +--------------+---------+------+-----------+------------+----------------+   Popliteal     no                                                         +--------------+---------+------+-----------+------------+----------------+   GSV at SFJ              yes    >500 ms      0.74                        +--------------+---------+------+-----------+------------+----------------+   GSV prox thigh          yes    >500 ms      0.43                        +--------------+---------+------+-----------+------------+----------------+   GSV mid thigh no                            0.45                        +--------------+---------+------+-----------+------------+----------------+  GSV dist thighno                            0.41                        +--------------+---------+------+-----------+------------+----------------+   GSV at knee   no                            0.42                        +--------------+---------+------+-----------+------------+----------------+   GSV prox calf no                            0.39                        +--------------+---------+------+-----------+------------+----------------+   SSV Pop Fossa           yes    >500 ms      0.48                        +--------------+---------+------+-----------+------------+----------------+   SSV prox calf           yes    >500 ms      0.46    chronic  thrombus  +--------------+---------+------+-----------+------------+----------------+   SSV mid calf            yes    >500 ms      0.63                        +--------------+---------+------+-----------+------------+----------------+            Summary:  Left:  - No evidence of deep vein thrombosis seen in the left lower extremity,  from the common femoral through the popliteal veins.  - Venous reflux is noted in the left common femoral vein.  - Venous reflux is noted in the left sapheno-femoral junction.  - Venous reflux is noted in the left short saphenous vein.  - Chronic superficial  vein thrombosis noted in the left SSV.     Assessment/Plan: Chronic venous reflux with brawny skin changes B History of 2.5 year chronic left medial ankle venous stasis wound.  Currently healed. Venous duplex reveals both deep,  SFJ, proximal GSV and SSV left LE venous reflux. There are no open wounds currently, he wears compression daily.  He has palpable DSP pulses B LE and is not at risk of limb loss. We will measure him today for thigh high 20-30 mm hg compression to be worn daily.  He will elevate his legs above the level of his heart multiple times a day and continue to stay active.  Avoid prolong sitting and standing.  He will f/u with our [facility] to be considered for laser ablation therapy.    Mosetta Pigeon PA-C [facility] [place] Office: [phone]  MD in clinic [place]

## 2021-03-22 ENCOUNTER — Other Ambulatory Visit: Payer: Self-pay | Admitting: Psychiatry

## 2021-03-22 ENCOUNTER — Other Ambulatory Visit: Payer: Self-pay | Admitting: Internal Medicine

## 2021-03-22 ENCOUNTER — Other Ambulatory Visit (HOSPITAL_COMMUNITY): Payer: Self-pay

## 2021-03-22 DIAGNOSIS — F39 Unspecified mood [affective] disorder: Secondary | ICD-10-CM

## 2021-03-22 DIAGNOSIS — F988 Other specified behavioral and emotional disorders with onset usually occurring in childhood and adolescence: Secondary | ICD-10-CM

## 2021-03-29 ENCOUNTER — Ambulatory Visit (INDEPENDENT_AMBULATORY_CARE_PROVIDER_SITE_OTHER): Payer: BC Managed Care – PPO | Admitting: Internal Medicine

## 2021-03-29 ENCOUNTER — Encounter: Payer: Self-pay | Admitting: Internal Medicine

## 2021-03-29 ENCOUNTER — Other Ambulatory Visit: Payer: Self-pay

## 2021-03-29 VITALS — BP 116/70 | HR 63 | Temp 97.7°F | Resp 18 | Ht 76.0 in | Wt 276.0 lb

## 2021-03-29 DIAGNOSIS — E291 Testicular hypofunction: Secondary | ICD-10-CM

## 2021-03-29 DIAGNOSIS — R739 Hyperglycemia, unspecified: Secondary | ICD-10-CM | POA: Diagnosis not present

## 2021-03-29 DIAGNOSIS — I872 Venous insufficiency (chronic) (peripheral): Secondary | ICD-10-CM | POA: Diagnosis not present

## 2021-03-29 LAB — HEMOGLOBIN A1C: Hgb A1c MFr Bld: 5.8 % (ref 4.6–6.5)

## 2021-03-29 LAB — TESTOSTERONE: Testosterone: 370.01 ng/dL (ref 300.00–890.00)

## 2021-03-29 MED ORDER — TESTOSTERONE CYPIONATE 200 MG/ML IM SOLN: INTRAMUSCULAR | Status: DC

## 2021-03-29 NOTE — Patient Instructions (Signed)
    GO TO THE LAB : Get the blood work     GO TO THE FRONT DESK, PLEASE SCHEDULE YOUR APPOINTMENTS Come back for a checkup in 6 months 

## 2021-03-29 NOTE — Assessment & Plan Note (Signed)
Hypogonadism: Currently taking testosterone 1 mL every 2 weeks, last levels low, I was under the impression that he was taking 1.5 mL every 2 weeks.  Checking levels.  Further advised with results. HTN: BP is very good.  On no medicines. Depression, ADD: Reports symptoms are controlled, under the care of psychiatry. Chronic venous reflux with brawny skin changes B Saw vascular surgery. Venous duplex >>>  both deep,  SFJ, proximal GSV and SSV left LE venous reflux. Rx: thigh high 20-30 mm hg compression to be worn daily, leg elevation and avoid prolong sitting and standing. Considered for laser ablation therapy. Recommend flu and COVID vaccination.  He seems hesitant RTC 6 m

## 2021-03-29 NOTE — Progress Notes (Signed)
Subjective:    Patient ID: James Boone, male    DOB: 03-Dec-1960, 60 y.o.   MRN: [id number]  DOS:  03/29/2021 Type of visit - description: f/u  Since the last office visit in general feels well. Had episode of cellulitis that quickly resolved Saw vascular surgery regards the varicose veins. Reports good compliance with medicines.  Review of Systems See above   Past Medical History:  Diagnosis Date   Abnormal liver function test    Acquired cavovarus deformity of right foot 2016   Dr. Victorino Dike, using boot modification and [place] brace   ADD (attention deficit disorder)    ADD (attention deficit disorder)    Allergic rhinitis    Arthritis    Back pain    lower   Complete rotator cuff tear of left shoulder 07/12/2017   Depression    Hemorrhoid    Hypercholesteremia    Hyperglycemia    Hyperlipidemia    Hypertension    Hypogonadism male    Peripheral edema    Sleep apnea 2005   dx in 2005 , on CPAP setting of 13   Varicose veins    both legs    Past Surgical History:  Procedure Laterality Date   HERNIA REPAIR  2001   abdominal hernia   KNEE ARTHROSCOPY  05/2008   left, Dr.Geoffrey   KNEE ARTHROSCOPY  2008   right, Dr.Geoffrey   SHOULDER ARTHROSCOPY WITH ROTATOR CUFF REPAIR AND SUBACROMIAL DECOMPRESSION Left 07/12/2017   Procedure: LEFT SHOULDER ARTHROSCOPY DEBRIDEMENT, ACROMIOPLASTY, ROTATOR CUFF REPAIR;  Surgeon: Teryl Lucy, MD;  Location: [facility];  Service: Orthopedics;  Laterality: Left;   TOTAL ANKLE REPLACEMENT Right 2016   TOTAL KNEE ARTHROPLASTY Left 12/30/2012   Procedure: LEFT TOTAL KNEE ARTHROPLASTY;  Surgeon: Loanne Drilling, MD;  Location: [facility];  Service: Orthopedics;  Laterality: Left;   TOTAL KNEE ARTHROPLASTY Right 03/16/2014   Procedure: RIGHT TOTAL KNEE ARTHROPLASTY;  Surgeon: Loanne Drilling, MD;  Location: [facility];  Service: Orthopedics;  Laterality: Right;   VASECTOMY  1990   wisdom teeth extractiion      Allergies as of  03/29/2021   No Known Allergies      Medication List        Accurate as of March 29, 2021  7:06 PM. If you have any questions, ask your nurse or doctor.          STOP taking these medications    doxycycline 100 MG tablet Commonly known as: VIBRA-TABS Stopped by: [name], MD       TAKE these medications    amphetamine-dextroamphetamine 20 MG 24 hr capsule Commonly known as: Adderall XR Take 1 capsule by mouth each morning and afternoon   amphetamine-dextroamphetamine 20 MG 24 hr capsule Commonly known as: Adderall XR Take 1 capsule by mouth each morning and afternoon   ascorbic acid 500 MG tablet Commonly known as: VITAMIN C Take by mouth.   aspirin EC 81 MG tablet Take 81 mg by mouth daily.   atorvastatin 10 MG tablet Commonly known as: LIPITOR TAKE 1 TABLET BY MOUTH EVERY DAY   B-D 3CC LUER-LOK SYR 23GX1-1/2 23G X 1-1/2" 3 ML Misc Generic drug: SYRINGE-NEEDLE (DISP) 3 ML USE AS DIRECTED FOR INTRAMUSCULAR TESTOSTERONE INJECTIONS   B-D 3CC LUER-LOK SYR 23GX1-1/2 23G X 1-1/2" 3 ML Misc Generic drug: SYRINGE-NEEDLE (DISP) 3 ML Use as directed for testosterone injections   buPROPion 150 MG 24 hr tablet Commonly known as: WELLBUTRIN XL TAKE  1 TABLET BY MOUTH ONCE DAILY   lamoTRIgine 150 MG tablet Commonly known as: LAMICTAL TAKE 1&1/2 TABLETS BY MOUTH ONCE A DAY   MULTIVITAMIN ADULT PO Take by mouth.   sertraline 100 MG tablet Commonly known as: ZOLOFT TAKE 1 TABLET BY MOUTH ONCE DAILY   testosterone cypionate 200 MG/ML injection Commonly known as: DEPOTESTOSTERONE CYPIONATE 1 ml every 2 weeks What changed: additional instructions Changed by: [name], MD   VITAMIN B 12 PO Take by mouth.           Objective:   Physical Exam BP 116/70 (BP Location: Left Arm, Patient Position: Sitting, Cuff Size: Large)   Pulse 63   Temp 97.7 F (36.5 C) (Oral)   Resp 18   Ht 6\' 4"  (1.93 m)   Wt 276 lb (125.2 kg)   SpO2 98%   BMI 33.60 kg/m   General:   Well developed, NAD, BMI noted. HEENT:  Normocephalic . Face symmetric, atraumatic Lungs:  CTA B Normal respiratory effort, no intercostal retractions, no accessory muscle use. Heart: RRR,  no murmur.  Lower extremities: no edema, chronic skin changes. Skin: Not pale. Not jaundice Neurologic:  alert & oriented X3.  Speech normal, gait appropriate for age and unassisted Psych--  Cognition and judgment appear intact.  Cooperative with normal attention span and concentration.  Behavior appropriate. No anxious or depressed appearing.      Assessment    Assessment Hyperglycemia  HTN- Hyperlipidemia-- lipitor  Depression, ADD. Used to see Dr. ,  [facility] Elevated LFTs Chronic venous insuff w/ stasis dermatitis  DJD OSA -- on CPAP Morbid obesity  Hypogonadism -- f/u pcp FH CAD father , 61, smoker  PLAN:  Hypogonadism: Currently taking testosterone 1 mL every 2 weeks, last levels low, I was under the impression that he was taking 1.5 mL every 2 weeks.  Checking levels.  Further advised with results. HTN: BP is very good.  On no medicines. Depression, ADD: Reports symptoms are controlled, under the care of psychiatry. Chronic venous reflux with brawny skin changes B Saw vascular surgery. Venous duplex >>>  both deep,  SFJ, proximal GSV and SSV left LE venous reflux. Rx: thigh high 20-30 mm hg compression to be worn daily, leg elevation and avoid prolong sitting and standing. Considered for laser ablation therapy. Recommend flu and COVID vaccination.  He seems hesitant RTC 6 m     This visit occurred during the SARS-CoV-2 public health emergency.  Safety protocols were in place, including screening questions prior to the visit, additional usage of staff PPE, and extensive cleaning of exam room while observing appropriate contact time as indicated for disinfecting solutions.

## 2021-05-03 ENCOUNTER — Other Ambulatory Visit (HOSPITAL_COMMUNITY): Payer: Self-pay

## 2021-05-03 ENCOUNTER — Other Ambulatory Visit: Payer: Self-pay

## 2021-05-03 ENCOUNTER — Encounter: Payer: Self-pay | Admitting: Psychiatry

## 2021-05-03 ENCOUNTER — Ambulatory Visit (INDEPENDENT_AMBULATORY_CARE_PROVIDER_SITE_OTHER): Payer: BC Managed Care – PPO | Admitting: Psychiatry

## 2021-05-03 DIAGNOSIS — F331 Major depressive disorder, recurrent, moderate: Secondary | ICD-10-CM | POA: Diagnosis not present

## 2021-05-03 DIAGNOSIS — F39 Unspecified mood [affective] disorder: Secondary | ICD-10-CM

## 2021-05-03 DIAGNOSIS — F4010 Social phobia, unspecified: Secondary | ICD-10-CM | POA: Diagnosis not present

## 2021-05-03 DIAGNOSIS — F5105 Insomnia due to other mental disorder: Secondary | ICD-10-CM

## 2021-05-03 DIAGNOSIS — F988 Other specified behavioral and emotional disorders with onset usually occurring in childhood and adolescence: Secondary | ICD-10-CM

## 2021-05-03 MED ORDER — LAMOTRIGINE 150 MG PO TABS
ORAL_TABLET | ORAL | 5 refills | Status: DC
Start: 2021-05-03 — End: 2022-02-07
  Filled 2021-05-03: qty 45, 30d supply, fill #0
  Filled 2021-08-09: qty 45, 30d supply, fill #1
  Filled 2021-11-10: qty 45, 30d supply, fill #2
  Filled 2022-01-24: qty 45, 30d supply, fill #3

## 2021-05-03 MED ORDER — SERTRALINE HCL 100 MG PO TABS
ORAL_TABLET | Freq: Every day | ORAL | 3 refills | Status: DC
  Filled 2021-05-03: qty 90, 90d supply, fill #0
  Filled 2021-08-30: qty 90, 90d supply, fill #1
  Filled 2021-12-30: qty 90, 90d supply, fill #2

## 2021-05-03 MED ORDER — BUPROPION HCL ER (XL) 300 MG PO TB24: 300.0000 mg | ORAL_TABLET | Freq: Every day | ORAL | 11 refills | Status: DC

## 2021-05-03 NOTE — Progress Notes (Signed)
NYCHOLAS RAYNER [id number] [birth year] 60 y.o.    Lauraine Rinne, MD   Subjective:   Patient ID:  James Boone is a 60 y.o. (DOB December 30, 1960) male.  Chief Complaint:  Chief Complaint  Patient presents with   Follow-up    Bipolar II disorder (HCC) Episodic mood disorder (HCC)   ADD   Depression    Depression       [name] presents for follow-up of ADD and bipolar disorder.  visit 08/12/19.  No meds were changed.  It looks like he is remained on the same med regimen for several years.  02/10/2020 appointment with the following noted: No medications were changed. Still working Sales executive.  Work function is OK.  Short-handed. These are the highest dosage of sertraline 100 and Wellbutrin 150 that he has ever taken. Adderall XR still works for focus.  Takes all 40 mg in the morning.  Misses it if not taken. No major concerns re: mental health nor meds. No sig changes except a little more down over M-in-law died last month after living with them. Pt reports that mood is good for the most part. No sig depression.  Situational anxiety.   Some days are harder. describes anxiety as Minimal. Anxiety symptoms include: Social Anxiety,. Pt reports some irregularity with 4-5 hours typical for years. Enough sleep.No specific interference.  Occ EMA.   No sleepers.  Occ drowsy only if still  . Pt reports that appetite is good. Pt reports that energy is good and good. Concentration is down slightly. Suicidal thoughts:  denied by patient.  No suicide attempts.  Denies mood swings lately.  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  11/09/2020 appointment with the following noted: Doing OK.  Still working and going OK.  Less short handed but works 40 hours weekly. No unusual anxiety or depression.  Still benefits from meds including Adderall.   Sleep with some difficulty falling asleep but not unusual. Tea in AM and 2 mtn Dews a day.  Pending CPAP use bc had mask problem. No SE. Adderall costing  $110/month now.  For 2 XR daily.  05/03/21 appt noted: Not taking Adderall XR DT cost.  Doesn't miss it a whole lot.  Off for mos. No concerns from boss without it or making mistakes.   Mood has been so so and probably a little worse.  Just working to pay bills.  No hobbies or interests now.  No golf bc joints and less spare time.  Gkids 3 days per week and has household things to do.  Work 5 days per week.  Situational stress and doesn't want med changes. On Zoloft 100 mg daily and lamotrigine 225 mg daily, Wellbutrin 150 No SE  No anxiety to speak of.   Using CPAP. Sleep is OK.  Last sign depression usually situational and doesn't last long.  Past Psychiatric Medication Trials: Adderall,  sertraline, Wellbutrin,  lamotrigine  Review of Systems:  Review of Systems  Cardiovascular:  Negative for palpitations.  Musculoskeletal:  Positive for arthralgias.  Skin:        Episode cellulitis  Neurological:  Negative for tremors and weakness.  Psychiatric/Behavioral:  Positive for dysphoric mood.    Medications: I have reviewed the patient's current medications.  Current Outpatient Medications  Medication Sig Dispense Refill   ascorbic acid (VITAMIN C) 500 MG tablet Take by mouth.     aspirin EC 81 MG tablet Take 81 mg by mouth daily.  atorvastatin (LIPITOR) 10 MG tablet TAKE 1 TABLET BY MOUTH EVERY DAY 90 tablet 0   B-D 3CC LUER-LOK SYR 23GX1-1/2 23G X 1-1/2" 3 ML MISC USE AS DIRECTED FOR INTRAMUSCULAR TESTOSTERONE INJECTIONS 100 each 11   buPROPion (WELLBUTRIN XL) 150 MG 24 hr tablet TAKE 1 TABLET BY MOUTH ONCE DAILY 30 tablet 0   Cyanocobalamin (VITAMIN B 12 PO) Take by mouth.     lamoTRIgine (LAMICTAL) 150 MG tablet TAKE 1&1/2 TABLETS BY MOUTH ONCE A DAY 45 tablet 5   Multiple Vitamin (MULTIVITAMIN ADULT PO) Take by mouth.     sertraline (ZOLOFT) 100 MG tablet TAKE 1 TABLET BY MOUTH ONCE DAILY 30 tablet 2   SYRINGE-NEEDLE, DISP, 3 ML (B-D 3CC LUER-LOK SYR 23GX1-1/2) 23G X  1-1/2" 3 ML MISC Use as directed for testosterone injections 100 each 0   testosterone cypionate (DEPOTESTOSTERONE CYPIONATE) 200 MG/ML injection 1 ml every 2 weeks     amphetamine-dextroamphetamine (ADDERALL XR) 20 MG 24 hr capsule Take 1 capsule by mouth each morning and afternoon (Patient not taking: Reported on 05/03/2021) 60 capsule 0   amphetamine-dextroamphetamine (ADDERALL XR) 20 MG 24 hr capsule Take 1 capsule by mouth each morning and afternoon (Patient not taking: Reported on 05/03/2021) 60 capsule 0   No current facility-administered medications for this visit.    Medication Side Effects: None except dry  Allergies: No Known Allergies  Past Medical History:  Diagnosis Date   Abnormal liver function test    Acquired cavovarus deformity of right foot 2016   Dr. Victorino Dike, using boot modification and [place] brace   ADD (attention deficit disorder)    ADD (attention deficit disorder)    Allergic rhinitis    Arthritis    Back pain    lower   Complete rotator cuff tear of left shoulder 07/12/2017   Depression    Hemorrhoid    Hypercholesteremia    Hyperglycemia    Hyperlipidemia    Hypertension    Hypogonadism male    Peripheral edema    Sleep apnea 2005   dx in 2005 , on CPAP setting of 13   Varicose veins    both legs    Family History  Problem Relation Age of Onset   Depression Sister        no suicide    Stroke Father        ag ~ 52   Coronary artery disease Father        smoker, MI and a CABG at 49 y/o   Colon cancer Neg Hx    Prostate cancer Neg Hx    Diabetes Neg Hx     Social History   Socioeconomic History   Marital status: Married    Spouse name: Not on file   Number of children: 2   Years of education: Not on file   Highest education level: Not on file  Occupational History   Occupation: [employer], sales   Tobacco Use   Smoking status: Never   Smokeless tobacco: Never  Vaping Use   Vaping Use: Never used  Substance and Sexual  Activity   Alcohol use: Yes    Comment: socially    Drug use: No   Sexual activity: Yes  Other Topics Concern   Not on file  Social History Narrative   Lives w/ wife, 1 child     Social Determinants of Health   Financial Resource Strain: Not on file  Food Insecurity: Not on file  Transportation  Needs: Not on file  Physical Activity: Not on file  Stress: Not on file  Social Connections: Not on file  Intimate Partner Violence: Not on file    Past Medical History, Surgical history, Social history, and Family history were reviewed and updated as appropriate.   Please see review of systems for further details on the patient's review from today.   Objective:   Physical Exam:  There were no vitals taken for this visit.  Physical Exam Constitutional:      General: He is not in acute distress.    Appearance: He is well-developed. He is obese.  Musculoskeletal:        General: Swelling present. No deformity.  Neurological:     Mental Status: He is alert and oriented to person, place, and time.     Cranial Nerves: No dysarthria.     Coordination: Coordination normal.  Psychiatric:        Attention and Perception: Attention and perception normal. He does not perceive auditory or visual hallucinations.        Mood and Affect: Mood is depressed. Mood is not anxious. Affect is blunt. Affect is not labile, angry or inappropriate.        Speech: Speech normal.        Behavior: Behavior normal. Behavior is cooperative.        Thought Content: Thought content normal. Thought content is not paranoid or delusional. Thought content does not include homicidal or suicidal ideation. Thought content does not include homicidal or suicidal plan.        Cognition and Memory: Cognition and memory normal.        Judgment: Judgment normal.     Comments: Insight intact. No delusions.  Hypoverbal    Lab Review:     Component Value Date/Time   NA 138 11/30/2020 1339   K 4.4 11/30/2020 1339    CL 100 11/30/2020 1339   CO2 30 11/30/2020 1339   GLUCOSE 74 11/30/2020 1339   BUN 24 (H) 11/30/2020 1339   CREATININE 1.18 11/30/2020 1339   CALCIUM 9.6 11/30/2020 1339   PROT 7.2 11/30/2020 1339   ALBUMIN 4.4 11/30/2020 1339   AST 36 11/30/2020 1339   ALT 33 11/30/2020 1339   ALKPHOS 87 11/30/2020 1339   BILITOT 0.7 11/30/2020 1339   GFRNONAA >90 03/18/2014 0125   GFRAA >90 03/18/2014 0125       Component Value Date/Time   WBC 7.4 11/30/2020 1339   RBC 5.27 11/30/2020 1339   HGB 15.1 11/30/2020 1339   HCT 45.2 11/30/2020 1339   PLT 207.0 11/30/2020 1339   MCV 85.7 11/30/2020 1339   MCH 28.7 03/18/2014 0125   MCHC 33.4 11/30/2020 1339   RDW 17.5 (H) 11/30/2020 1339   LYMPHSABS 2.2 11/30/2020 1339   MONOABS 0.8 11/30/2020 1339   EOSABS 0.4 11/30/2020 1339   BASOSABS 0.1 11/30/2020 1339    No results found for: POCLITH, LITHIUM   No results found for: PHENYTOIN, PHENOBARB, VALPROATE, CBMZ   .res Assessment: Plan:    Major depressive disorder, recurrent episode, moderate (HCC)  Attention deficit disorder (ADD) without hyperactivity  Episodic mood disorder (HCC)  Social anxiety disorder  Insomnia due to mental condition  Per Dr. Christena Deem note's patient likely has Asberger's syndrome plus or minus social anxiety as well.  The patient is satisfied with his current medications.  His answers were brief to questions but he denied most psychiatric symptoms except for some situational anxiety and depressive symptoms.  The primary stressors are probably financial at this point.  Overall he is satisfied with his medication and does not want any medication changes.  Discussed potential benefits, risks, and side effects of stimulants with patient to include increased heart rate, palpitations, insomnia, increased anxiety, increased irritability, or decreased appetite.  Instructed patient to contact office if experiencing any significant tolerability issues.  Option Adzenys  instead of  Adderall XR .  Still doing ok at work without it.  No particular mood swings were noted.  However is a bit more depressed.  Disc good rx and will send Adderall XR tgo [facility]  No med changes today per his request Continue sertraline 100 and lamotrigine 225 mg daily  But increase to usual dose of  Wellbutrin XL 300 mg daily  Follow-up 9 months  Meredith Staggers MD, DFAPA  Please see After Visit Summary for patient specific instructions.  Future Appointments  Date Time Provider Department Center  06/01/2021  1:00 PM [facility] - [facility]  06/01/2021  2:00 PM Maeola Harman, MD [facility]  09/27/2021  9:00 AM Wanda Plump, MD [facility]    No orders of the defined types were placed in this encounter.     -------------------------------

## 2021-05-19 ENCOUNTER — Other Ambulatory Visit: Payer: Self-pay | Admitting: *Deleted

## 2021-05-19 DIAGNOSIS — I872 Venous insufficiency (chronic) (peripheral): Secondary | ICD-10-CM

## 2021-05-28 ENCOUNTER — Other Ambulatory Visit: Payer: Self-pay | Admitting: Psychiatry

## 2021-05-28 DIAGNOSIS — F331 Major depressive disorder, recurrent, moderate: Secondary | ICD-10-CM

## 2021-05-28 DIAGNOSIS — F988 Other specified behavioral and emotional disorders with onset usually occurring in childhood and adolescence: Secondary | ICD-10-CM

## 2021-05-28 DIAGNOSIS — F39 Unspecified mood [affective] disorder: Secondary | ICD-10-CM

## 2021-05-29 ENCOUNTER — Other Ambulatory Visit: Payer: Self-pay | Admitting: Internal Medicine

## 2021-06-01 ENCOUNTER — Ambulatory Visit (INDEPENDENT_AMBULATORY_CARE_PROVIDER_SITE_OTHER): Payer: BLUE CROSS/BLUE SHIELD | Admitting: Vascular Surgery

## 2021-06-01 ENCOUNTER — Other Ambulatory Visit: Payer: Self-pay

## 2021-06-01 ENCOUNTER — Encounter: Payer: Self-pay | Admitting: Vascular Surgery

## 2021-06-01 ENCOUNTER — Ambulatory Visit (HOSPITAL_COMMUNITY)
Admission: RE | Admit: 2021-06-01 | Discharge: 2021-06-01 | Disposition: A | Discharge status: Home / Self Care | Payer: BLUE CROSS/BLUE SHIELD | Source: Ambulatory Visit | Attending: Vascular Surgery | Admitting: Vascular Surgery

## 2021-06-01 VITALS — BP 129/76 | HR 73 | Temp 98.3°F | Resp 20 | Ht 76.0 in | Wt 271.0 lb

## 2021-06-01 DIAGNOSIS — I872 Venous insufficiency (chronic) (peripheral): Secondary | ICD-10-CM

## 2021-06-01 NOTE — Progress Notes (Signed)
Patient ID: James Boone, male   DOB: 05-11-1961, 61 y.o.   MRN: [id number]  Reason for Consult: Follow-up   Referred by Wanda Plump, MD  Subjective:     HPI:  James Boone is a 61 y.o. male has a previous left medial ankle ulcer which has been present from 2-1/2 years intermittently.  Most recently he was seen in our office and evaluated with lower extremity reflux study.  He has been compliant with compression socks.  He does not have any history of DVT no history of venous intervention.  Since last visit has not had recurrence of his ulcer.  He does have skin changes of the right lower extremity.  He also has previous knee surgery bilaterally and ankle surgery on the left.  He does not take any blood thinners.  Past Medical History:  Diagnosis Date   Abnormal liver function test    Acquired cavovarus deformity of right foot 2016   Dr. Victorino Dike, using boot modification and [place] brace   ADD (attention deficit disorder)    ADD (attention deficit disorder)    Allergic rhinitis    Arthritis    Back pain    lower   Complete rotator cuff tear of left shoulder 07/12/2017   Depression    Hemorrhoid    Hypercholesteremia    Hyperglycemia    Hyperlipidemia    Hypertension    Hypogonadism male    Peripheral edema    Sleep apnea 2005   dx in 2005 , on CPAP setting of 13   Varicose veins    both legs   Family History  Problem Relation Age of Onset   Depression Sister        no suicide    Stroke Father        ag ~ 42   Coronary artery disease Father        smoker, MI and a CABG at 48 y/o   Colon cancer Neg Hx    Prostate cancer Neg Hx    Diabetes Neg Hx    Past Surgical History:  Procedure Laterality Date   HERNIA REPAIR  2001   abdominal hernia   KNEE ARTHROSCOPY  05/2008   left, Dr.Geoffrey   KNEE ARTHROSCOPY  2008   right, Dr.Geoffrey   SHOULDER ARTHROSCOPY WITH ROTATOR CUFF REPAIR AND SUBACROMIAL DECOMPRESSION Left 07/12/2017   Procedure: LEFT SHOULDER ARTHROSCOPY  DEBRIDEMENT, ACROMIOPLASTY, ROTATOR CUFF REPAIR;  Surgeon: Teryl Lucy, MD;  Location: [facility];  Service: Orthopedics;  Laterality: Left;   TOTAL ANKLE REPLACEMENT Right 2016   TOTAL KNEE ARTHROPLASTY Left 12/30/2012   Procedure: LEFT TOTAL KNEE ARTHROPLASTY;  Surgeon: Loanne Drilling, MD;  Location: [facility];  Service: Orthopedics;  Laterality: Left;   TOTAL KNEE ARTHROPLASTY Right 03/16/2014   Procedure: RIGHT TOTAL KNEE ARTHROPLASTY;  Surgeon: Loanne Drilling, MD;  Location: [facility];  Service: Orthopedics;  Laterality: Right;   VASECTOMY  1990   wisdom teeth extractiion      Short Social History:  Social History   Tobacco Use   Smoking status: Never   Smokeless tobacco: Never  Substance Use Topics   Alcohol use: Yes    Comment: socially     No Known Allergies  Current Outpatient Medications  Medication Sig Dispense Refill   ascorbic acid (VITAMIN C) 500 MG tablet Take by mouth.     aspirin EC 81 MG tablet Take 81 mg by mouth daily.     atorvastatin (  LIPITOR) 10 MG tablet TAKE 1 TABLET BY MOUTH EVERY DAY 90 tablet 1   B-D 3CC LUER-LOK SYR 23GX1-1/2 23G X 1-1/2" 3 ML MISC USE AS DIRECTED FOR INTRAMUSCULAR TESTOSTERONE INJECTIONS 100 each 11   buPROPion (WELLBUTRIN XL) 300 MG 24 hr tablet Take 1 tablet (300 mg total) by mouth daily. 30 tablet 11   Cyanocobalamin (VITAMIN B 12 PO) Take by mouth.     lamoTRIgine (LAMICTAL) 150 MG tablet TAKE 1 & 1/2 TABLETS BY MOUTH ONCE A DAY 45 tablet 5   Multiple Vitamin (MULTIVITAMIN ADULT PO) Take by mouth.     sertraline (ZOLOFT) 100 MG tablet TAKE 1 TABLET BY MOUTH ONCE DAILY 90 tablet 3   SYRINGE-NEEDLE, DISP, 3 ML (B-D 3CC LUER-LOK SYR 23GX1-1/2) 23G X 1-1/2" 3 ML MISC Use as directed for testosterone injections 100 each 0   testosterone cypionate (DEPOTESTOSTERONE CYPIONATE) 200 MG/ML injection 1 ml every 2 weeks     amphetamine-dextroamphetamine (ADDERALL XR) 20 MG 24 hr capsule Take 1 capsule by mouth each morning and  afternoon (Patient not taking: Reported on 05/03/2021) 60 capsule 0   amphetamine-dextroamphetamine (ADDERALL XR) 20 MG 24 hr capsule Take 1 capsule by mouth each morning and afternoon (Patient not taking: Reported on 05/03/2021) 60 capsule 0   No current facility-administered medications for this visit.    Review of Systems  Constitutional:  Constitutional negative. HENT: HENT negative.  Eyes: Eyes negative.  Respiratory: Respiratory negative.  Cardiovascular: Positive for leg swelling.  GI: Gastrointestinal negative.  Skin:       Healed left medial ankle ulceration Neurological: Neurological negative. Hematologic: Hematologic/lymphatic negative.  Psychiatric: Psychiatric negative.       Objective:  Objective  Vitals:   06/01/21 1335  BP: 129/76  Pulse: 73  Resp: 20  Temp: 98.3 F (36.8 C)  SpO2: 96%     Physical Exam HENT:     Head: Normocephalic.     Nose:     Comments: Wearing a mask Eyes:     Pupils: Pupils are equal, round, and reactive to light.  Cardiovascular:     Rate and Rhythm: Normal rate.     Pulses: Normal pulses.  Pulmonary:     Effort: Pulmonary effort is normal.  Abdominal:     General: Abdomen is flat.     Palpations: Abdomen is soft.  Musculoskeletal:        General: Normal range of motion.     Cervical back: Normal range of motion and neck supple.     Right lower leg: Edema present.     Left lower leg: Edema present.  Skin:    Comments: Right lower extremity has evidence of venous insufficiency with pigmentation.  Left lower extremity with evidence of healed ulceration left medial ankle.  There is trace edema bilaterally without pitting.  Neurological:     Mental Status: He is alert.    Data:  LEFT           Reflux No Reflux Reflux Time Diameter cms Comments                                        Yes                                                 +--------------+---------+------+-----------+------------+----------------+  CFV                        yes    >1 second                                    +--------------+---------+------+-----------+------------+----------------+    FV mid         no                                                             +--------------+---------+------+-----------+------------+----------------+    Popliteal      no                                                             +--------------+---------+------+-----------+------------+----------------+    GSV at SFJ                yes     >500 ms       0.74                          +--------------+---------+------+-----------+------------+----------------+    GSV prox thigh            yes     >500 ms       0.43                          +--------------+---------+------+-----------+------------+----------------+    GSV mid thigh  no                               0.45                          +--------------+---------+------+-----------+------------+----------------+    GSV dist thigh no                               0.41                          +--------------+---------+------+-----------+------------+----------------+    GSV at knee    no                               0.42                          +--------------+---------+------+-----------+------------+----------------+    GSV prox calf  no                               0.39                          +--------------+---------+------+-----------+------------+----------------+    SSV Pop Fossa  yes     >500 ms       0.48                          +--------------+---------+------+-----------+------------+----------------+    SSV prox calf             yes     >500 ms       0.46     chronic  thrombus   +--------------+---------+------+-----------+------------+----------------+    SSV mid calf              yes     >500 ms       0.63                          +--------------+---------+------+-----------+------------+----------------+            Summary:   Left:  - No evidence of deep vein thrombosis seen in the left lower extremity,  from the common femoral through the popliteal veins.  - Venous reflux is noted in the left common femoral vein.  - Venous reflux is noted in the left sapheno-femoral junction.  - Venous reflux is noted in the left short saphenous vein.  - Chronic superficial vein thrombosis noted in the left SSV.     I evaluated his left lower extremity with ultrasound today in the office.  There is a large saphenous vein from the junction through the mid thigh where branches just above the knee and exits the fascia.  Below the knee it is more diminutive.  There is a large small saphenous vein as well which appears to communicate to the popliteal vein this was more difficult to evaluate given his positioning.     Assessment/Plan:     61 year old male with C5 venous disease having healed his ulcer intermittently over several years.  He is compliant with compression stockings.  He does have a large saphenous vein down to the level of the knee by my ultrasound today.  No significant reflux on the right evaluated today in our office.  He also has a small saphenous vein which may contribute on the left.  I discussed with him beginning with great saphenous vein ablation on the left.  We will fit him for a new pair of thigh-high compression socks today.  I discussed with him the risk benefits and alternatives and expected need for 2-week follow-up to evaluate for DVT post procedure and he demonstrates good understanding.      Maeola HarmanBrandon Christopher Keelyn Monjaras MD [facility]

## 2021-08-08 ENCOUNTER — Encounter: Payer: Self-pay | Admitting: Internal Medicine

## 2021-08-09 ENCOUNTER — Other Ambulatory Visit (HOSPITAL_COMMUNITY): Payer: Self-pay

## 2021-08-30 ENCOUNTER — Other Ambulatory Visit: Payer: Self-pay | Admitting: *Deleted

## 2021-08-30 ENCOUNTER — Other Ambulatory Visit (HOSPITAL_COMMUNITY): Payer: Self-pay

## 2021-08-30 ENCOUNTER — Telehealth: Payer: Self-pay

## 2021-08-30 ENCOUNTER — Telehealth: Payer: Self-pay | Admitting: Internal Medicine

## 2021-08-30 DIAGNOSIS — I83812 Varicose veins of left lower extremities with pain: Secondary | ICD-10-CM

## 2021-08-30 MED ORDER — TESTOSTERONE CYPIONATE 200 MG/ML IM SOLN
INTRAMUSCULAR | 3 refills | Status: DC
  Filled 2021-08-30: qty 2, 28d supply, fill #0

## 2021-08-30 NOTE — Telephone Encounter (Signed)
PA initiated via Covermymeds; KEY: [id number]. Awaiting determination.  ?

## 2021-08-30 NOTE — Telephone Encounter (Signed)
Requesting: testosterone 200mg /mL  ?Contract: n/a ?UDS: n/a ?Last Visit: 03/29/21 ?Next Visit:09/27/21 ?Last Refill:12/02/20 #21mL and 2RF ? ?Please Advise ? ?

## 2021-08-30 NOTE — Telephone Encounter (Signed)
PDMP okay, he is currently taking 1 mL every 2 weeks.  Prescription sent ?

## 2021-08-30 NOTE — Telephone Encounter (Signed)
PA approved. Effective 08/30/21 to 08/29/2024.  ?

## 2021-09-04 ENCOUNTER — Other Ambulatory Visit: Payer: Self-pay | Admitting: Psychiatry

## 2021-09-04 DIAGNOSIS — F988 Other specified behavioral and emotional disorders with onset usually occurring in childhood and adolescence: Secondary | ICD-10-CM

## 2021-09-04 DIAGNOSIS — F331 Major depressive disorder, recurrent, moderate: Secondary | ICD-10-CM

## 2021-09-04 DIAGNOSIS — F39 Unspecified mood [affective] disorder: Secondary | ICD-10-CM

## 2021-09-27 ENCOUNTER — Ambulatory Visit (HOSPITAL_BASED_OUTPATIENT_CLINIC_OR_DEPARTMENT_OTHER)
Admission: RE | Admit: 2021-09-27 | Discharge: 2021-09-27 | Disposition: A | Discharge status: Home / Self Care | Payer: BLUE CROSS/BLUE SHIELD | Source: Ambulatory Visit | Attending: Internal Medicine | Admitting: Internal Medicine

## 2021-09-27 ENCOUNTER — Encounter: Payer: Self-pay | Admitting: Internal Medicine

## 2021-09-27 ENCOUNTER — Other Ambulatory Visit: Payer: Self-pay | Admitting: *Deleted

## 2021-09-27 ENCOUNTER — Ambulatory Visit: Payer: BLUE CROSS/BLUE SHIELD | Admitting: Internal Medicine

## 2021-09-27 VITALS — BP 136/64 | HR 67 | Temp 97.9°F | Resp 18 | Ht 76.0 in | Wt 271.2 lb

## 2021-09-27 DIAGNOSIS — Q740 Other congenital malformations of upper limb(s), including shoulder girdle: Secondary | ICD-10-CM | POA: Insufficient documentation

## 2021-09-27 DIAGNOSIS — M25711 Osteophyte, right shoulder: Secondary | ICD-10-CM | POA: Diagnosis not present

## 2021-09-27 DIAGNOSIS — Z23 Encounter for immunization: Secondary | ICD-10-CM | POA: Diagnosis not present

## 2021-09-27 DIAGNOSIS — E291 Testicular hypofunction: Secondary | ICD-10-CM

## 2021-09-27 LAB — CBC WITH DIFFERENTIAL/PLATELET
Basophils Absolute: 0 K/uL (ref 0.0–0.1)
Basophils Relative: 0.8 % (ref 0.0–3.0)
Eosinophils Absolute: 0.2 K/uL (ref 0.0–0.7)
Eosinophils Relative: 4.5 % (ref 0.0–5.0)
HCT: 46.2 % (ref 39.0–52.0)
Hemoglobin: 15.4 g/dL (ref 13.0–17.0)
Lymphocytes Relative: 30.8 % (ref 12.0–46.0)
Lymphs Abs: 1.5 K/uL (ref 0.7–4.0)
MCHC: 33.4 g/dL (ref 30.0–36.0)
MCV: 88.1 fl (ref 78.0–100.0)
Monocytes Absolute: 0.5 K/uL (ref 0.1–1.0)
Monocytes Relative: 9.8 % (ref 3.0–12.0)
Neutro Abs: 2.7 K/uL (ref 1.4–7.7)
Neutrophils Relative %: 54.1 % (ref 43.0–77.0)
Platelets: 212 K/uL (ref 150.0–400.0)
RBC: 5.25 Mil/uL (ref 4.22–5.81)
RDW: 16.4 % — ABNORMAL HIGH (ref 11.5–15.5)
WBC: 5 K/uL (ref 4.0–10.5)

## 2021-09-27 LAB — TESTOSTERONE: Testosterone: 489.79 ng/dL (ref 300.00–890.00)

## 2021-09-27 MED ORDER — LORAZEPAM 1 MG PO TABS: ORAL_TABLET | ORAL | 0 refills | Status: DC

## 2021-09-27 NOTE — Progress Notes (Signed)
? ?Subjective:  ? ? Patient ID: James Boone, male    DOB: 07-06-1960, 61 y.o.   MRN: [id number] ? ?DOS:  09/27/2021 ?Type of visit - description: rov ? ?Since the last office visit is doing well. ?Has noted the right clavicle is "sticking out".  Denies any injury, no pain.  This is going on for a while. ? ? ?Review of Systems ?Denies any LUTS ? ?Past Medical History:  ?Diagnosis Date  ? Abnormal liver function test   ? Acquired cavovarus deformity of right foot 2016  ? Dr. Doran Durand, using boot modification and [place] brace  ? ADD (attention deficit disorder)   ? ADD (attention deficit disorder)   ? Allergic rhinitis   ? Arthritis   ? Back pain   ? lower  ? Complete rotator cuff tear of left shoulder 07/12/2017  ? Depression   ? Hemorrhoid   ? Hypercholesteremia   ? Hyperglycemia   ? Hyperlipidemia   ? Hypertension   ? Hypogonadism male   ? Peripheral edema   ? Sleep apnea 2005  ? dx in 2005 , on CPAP setting of 13  ? Varicose veins   ? both legs  ? ? ?Past Surgical History:  ?Procedure Laterality Date  ? HERNIA REPAIR  2001  ? abdominal hernia  ? KNEE ARTHROSCOPY  05/2008  ? left, Dr.Geoffrey  ? KNEE ARTHROSCOPY  2008  ? right, Dr.Geoffrey  ? SHOULDER ARTHROSCOPY WITH ROTATOR CUFF REPAIR AND SUBACROMIAL DECOMPRESSION Left 07/12/2017  ? Procedure: LEFT SHOULDER ARTHROSCOPY DEBRIDEMENT, ACROMIOPLASTY, ROTATOR CUFF REPAIR;  Surgeon: Marchia Bond, MD;  Location: [place];  Service: Orthopedics;  Laterality: Left;  ? TOTAL ANKLE REPLACEMENT Right 2016  ? TOTAL KNEE ARTHROPLASTY Left 12/30/2012  ? Procedure: LEFT TOTAL KNEE ARTHROPLASTY;  Surgeon: Gearlean Alf, MD;  Location: [facility];  Service: Orthopedics;  Laterality: Left;  ? TOTAL KNEE ARTHROPLASTY Right 03/16/2014  ? Procedure: RIGHT TOTAL KNEE ARTHROPLASTY;  Surgeon: Gearlean Alf, MD;  Location: [facility];  Service: Orthopedics;  Laterality: Right;  ? VASECTOMY  1990  ? wisdom teeth extractiion    ? ? ?Current Outpatient Medications  ?Medication  Instructions  ? ascorbic acid (VITAMIN C) 500 MG tablet Oral  ? aspirin EC 81 mg, Oral, Daily  ? atorvastatin (LIPITOR) 10 MG tablet TAKE 1 TABLET BY MOUTH EVERY DAY  ? B-D 3CC LUER-LOK SYR 23GX1-1/2 23G X 1-1/2" 3 ML MISC USE AS DIRECTED FOR INTRAMUSCULAR TESTOSTERONE INJECTIONS  ? buPROPion (WELLBUTRIN XL) 300 MG 24 hr tablet TAKE 1 TABLET BY MOUTH EVERY DAY  ? Cyanocobalamin (VITAMIN B 12 PO) Oral  ? lamoTRIgine (LAMICTAL) 150 MG tablet TAKE 1 & 1/2 TABLETS BY MOUTH ONCE A DAY  ? LORazepam (ATIVAN) 1 MG tablet Take 2 tablets 30 minutes prior to leaving house on day of office surgery.  Bring third tablet with you to office on day of office surgery.  ? Multiple Vitamin (MULTIVITAMIN ADULT PO) Oral  ? sertraline (ZOLOFT) 100 MG tablet TAKE 1 TABLET BY MOUTH ONCE DAILY  ? SYRINGE-NEEDLE, DISP, 3 ML (B-D 3CC LUER-LOK SYR 23GX1-1/2) 23G X 1-1/2" 3 ML MISC Use as directed for testosterone injections  ? testosterone cypionate (DEPOTESTOSTERONE CYPIONATE) 200 MG/ML injection Inject 1 ml every 2 weeks as directed (single use vial)  ? ? ?   ?Objective:  ? Physical Exam ?BP 136/64 (BP Location: Left Arm, Patient Position: Sitting, Cuff Size: Normal)   Pulse 67   Temp 97.9 ?F (36.6 ?  C) (Oral)   Resp 18   Ht 6\' 4"  (1.93 m)   Wt 271 lb 4 oz (123 kg)   SpO2 97%   BMI 33.02 kg/m?  ?General:   ?Well developed, NAD, BMI noted. ?HEENT:  ?Normocephalic . Face symmetric, atraumatic ?Neck:  ?No thyromegaly, no lymphadenopathies. ?Left clavicle normal.  ? Right clavicle: sternal end  is slightly more prominent, no TTP. ?Lungs:  ?CTA B ?Normal respiratory effort, no intercostal retractions, no accessory muscle use. ?Heart: RRR,  no murmur.  ?Lower extremities: no pretibial edema bilaterally  ?Skin: Not pale. Not jaundice ?Neurologic:  ?alert & oriented X3.  ?Speech normal, gait appropriate for age and unassisted ?Psych--  ?Cognition and judgment appear intact.  ?Cooperative with normal attention span and concentration.  ?Behavior  appropriate. ?No anxious or depressed appearing.  ? ?   ?Assessment   ? ? Assessment ?Hyperglycemia  ?HTN- ?Hyperlipidemia-- lipitor  ?Depression, ADD. Used to see Dr. Clovis Pu,  [facility] ?Elevated LFTs ?Chronic venous insuff w/ stasis dermatitis (sees vascular) ?DJD ?OSA -- on CPAP ?Morbid obesity  ?Hypogonadism -- f/u pcp ?FH CAD father , 8, smoker ? ?PLAN:  ?Hypogonadism: Last level satisfactory, on Testosterone 1 mL every 2 weeks.  Recheck testosterone level and CBC. ?Abnormal R clavicle on exam: Do not suspect any pathological process, to be sure we will get x-ray ?Chronic venous insufficiency: To have L leg ablation soon by vascular surgery ?Preventive care: Tdap today ?RTC CPX 3 months ?

## 2021-09-27 NOTE — Patient Instructions (Addendum)
? ?  Recommend to proceed with covid booster (bivalent) at your pharmacy.  ? ? ?GO TO THE LAB : Get the blood work   ? ? ?GO TO THE FRONT DESK, PLEASE SCHEDULE YOUR APPOINTMENTS ?Come back for   a physical exam in 3 months ? ?Proceed with a chest x-ray downstairs ?

## 2021-09-27 NOTE — Assessment & Plan Note (Signed)
Hypogonadism: Last level satisfactory, on Testosterone 1 mL every 2 weeks.  Recheck testosterone level and CBC. ?Abnormal R clavicle on exam: Do not suspect any pathological process, to be sure we will get x-ray ?Chronic venous insufficiency: To have L leg ablation soon by vascular surgery ?Preventive care: Tdap today ?RTC CPX 3 months ?

## 2021-10-03 ENCOUNTER — Other Ambulatory Visit (HOSPITAL_COMMUNITY): Payer: Self-pay

## 2021-10-06 ENCOUNTER — Encounter: Payer: Self-pay | Admitting: Vascular Surgery

## 2021-10-06 ENCOUNTER — Ambulatory Visit (INDEPENDENT_AMBULATORY_CARE_PROVIDER_SITE_OTHER): Payer: BLUE CROSS/BLUE SHIELD | Admitting: Vascular Surgery

## 2021-10-06 VITALS — BP 138/71 | HR 69 | Temp 97.8°F | Resp 18 | Ht 76.0 in | Wt 270.0 lb

## 2021-10-06 DIAGNOSIS — I872 Venous insufficiency (chronic) (peripheral): Secondary | ICD-10-CM | POA: Diagnosis not present

## 2021-10-06 HISTORY — PX: ENDOVENOUS ABLATION SAPHENOUS VEIN W/ LASER: SUR449

## 2021-10-06 NOTE — Progress Notes (Signed)
     Laser Ablation Procedure     Date: 10/06/2021   James Boone DOB:12-06-1960  Consent signed: Yes      Surgeon: Lemar Livings MD   Procedure: Laser Ablation: left Greater Saphenous Vein  BP 138/71 (BP Location: Left Arm, Patient Position: Sitting, Cuff Size: Large)   Pulse 69   Temp 97.8 F (36.6 C) (Temporal)   Resp 18   Ht 6\' 4"  (1.93 m)   Wt 270 lb (122.5 kg)   SpO2 98%   BMI 32.87 kg/m   Tumescent Anesthesia: 300 cc 0.9% NaCl with 50 cc Lidocaine HCL 1%  and 15 cc 8.4% NaHCO3  Local Anesthesia: 7 cc Lidocaine HCL and NaHCO3 (ratio 2:1)  7 watts continuous mode     Total energy: 1814 Joules    Total time: 259 seconds  Treatment Length  37 cm   Laser Fiber Ref. #     Lot # [id number]     Patient tolerated procedure well  Notes:  All staff members wore facial masks.  [name] took Ativan 1 mg on 10-06-2021 at 7:42 AM and at 8:30 AM.    Description of Procedure:  After marking the course of the secondary varicosities, the patient was placed on the operating table in the supine position, and the left leg was prepped and draped in sterile fashion.   Local anesthetic was administered and under ultrasound guidance the saphenous vein was accessed with a micro needle and guide wire; then the mirco puncture sheath was placed.  A guide wire was inserted saphenofemoral junction , followed by a 5 french sheath.  The position of the sheath and then the laser fiber below the junction was confirmed using the ultrasound.  Tumescent anesthesia was administered along the course of the saphenous vein using ultrasound guidance. The patient was placed in Trendelenburg position and protective laser glasses were placed on patient and staff, and the laser was fired at 7 watts continuous mode for a total of 1814 joules.       Steri strip was applied to the IV insertion site and ABD pads and thigh high compression stockings were applied.  Ace wrap bandages were applied over the  left thigh and at the top of the saphenofemoral junction. Blood loss was less than 15 cc.  Discharge instructions reviewed with patient and hardcopy of discharge instructions given to patient to take home. The patient ambulated out of the operating room having tolerated the procedure well.

## 2021-10-06 NOTE — Progress Notes (Signed)
Patient name: James Boone MRN: [id number] DOB: 09-10-1960 Sex: male  REASON FOR VISIT: Left greater saphenous vein ablation  HPI: James Boone is a 61 y.o. male history of left medial ankle ulcer that is healed intermittently.  He is now wearing compression socks has not had any ulceration recently.  We have evaluated his right lower extremity without any significant reflux although skin discoloration consistent with C4 venous disease.  He presents today with plan for ablation of the left greater saphenous vein.  Current Outpatient Medications  Medication Sig Dispense Refill   ascorbic acid (VITAMIN C) 500 MG tablet Take by mouth.     aspirin EC 81 MG tablet Take 81 mg by mouth daily.     atorvastatin (LIPITOR) 10 MG tablet TAKE 1 TABLET BY MOUTH EVERY DAY 90 tablet 1   B-D 3CC LUER-LOK SYR 23GX1-1/2 23G X 1-1/2" 3 ML MISC USE AS DIRECTED FOR INTRAMUSCULAR TESTOSTERONE INJECTIONS 100 each 11   buPROPion (WELLBUTRIN XL) 300 MG 24 hr tablet TAKE 1 TABLET BY MOUTH EVERY DAY 90 tablet 1   Cyanocobalamin (VITAMIN B 12 PO) Take by mouth.     lamoTRIgine (LAMICTAL) 150 MG tablet TAKE 1 & 1/2 TABLETS BY MOUTH ONCE A DAY 45 tablet 5   LORazepam (ATIVAN) 1 MG tablet Take 2 tablets 30 minutes prior to leaving house on day of office surgery.  Bring third tablet with you to office on day of office surgery. 3 tablet 0   Multiple Vitamin (MULTIVITAMIN ADULT PO) Take by mouth.     sertraline (ZOLOFT) 100 MG tablet TAKE 1 TABLET BY MOUTH ONCE DAILY 90 tablet 3   SYRINGE-NEEDLE, DISP, 3 ML (B-D 3CC LUER-LOK SYR 23GX1-1/2) 23G X 1-1/2" 3 ML MISC Use as directed for testosterone injections 100 each 0   testosterone cypionate (DEPOTESTOSTERONE CYPIONATE) 200 MG/ML injection Inject 1 ml every 2 weeks as directed (single use vial) 10 mL 3   No current facility-administered medications for this visit.    PHYSICAL EXAM: Vitals:   10/06/21 0836  BP: 138/71  Pulse: 69  Resp: 18  Temp: 97.8 F (36.6 C)   SpO2: 98%   Awake alert oriented Stable skin changes bilateral lower extremities with healed ulcer on the left medial ankle Greater saphenous vein was mapped from just below the knee all the way to the saphenofemoral junction and this was easily visualized  PROCEDURE: Endovenous laser ablation left greater saphenous vein  TECHNIQUE: Patient was brought to the procedure room was initially placed supine on the table and his left leg was evaluated with ultrasound.  Were able to easily identify the greater saphenous vein.  He was then sterilely prepped and draped in the left lower extremity and a timeout was called.  Ultrasound was used to identify the greater saphenous vein just below the knee and this was cannulated with a micropuncture needle followed by wire and a micropuncture sheath.  I then placed a long J-wire 3 cm from the saphenofemoral junction.  The laser catheter was then placed to this level and a laser was placed there 37 cm from the skin.  We then injected tumescent anesthesia along the entire level of the laser up to the saphenofemoral junction.  The vein was then ablated for a total of 37 cm.  At completion the common femoral vein was patent and compressible and the saphenous vein did appear sclerosed from just a couple centimeters past the saphenofemoral junction.  Sterile dressing was  then applied with compressive wrap to his left lower extremity.  He tolerated the procedure well without immediate complications.  Lemar Livings [facility] [phone]

## 2021-10-20 ENCOUNTER — Ambulatory Visit (HOSPITAL_COMMUNITY)
Admission: RE | Admit: 2021-10-20 | Discharge: 2021-10-20 | Disposition: A | Discharge status: Home / Self Care | Payer: BLUE CROSS/BLUE SHIELD | Source: Ambulatory Visit | Attending: Vascular Surgery | Admitting: Vascular Surgery

## 2021-10-20 ENCOUNTER — Ambulatory Visit (INDEPENDENT_AMBULATORY_CARE_PROVIDER_SITE_OTHER): Payer: BLUE CROSS/BLUE SHIELD | Admitting: Vascular Surgery

## 2021-10-20 ENCOUNTER — Encounter: Payer: Self-pay | Admitting: Vascular Surgery

## 2021-10-20 VITALS — BP 137/78 | HR 72 | Temp 98.2°F | Resp 16 | Ht 76.0 in | Wt 274.0 lb

## 2021-10-20 DIAGNOSIS — I83812 Varicose veins of left lower extremities with pain: Secondary | ICD-10-CM | POA: Diagnosis not present

## 2021-10-20 DIAGNOSIS — I872 Venous insufficiency (chronic) (peripheral): Secondary | ICD-10-CM | POA: Diagnosis not present

## 2021-10-20 NOTE — Progress Notes (Signed)
Patient ID: SAMPSON SELF, male   DOB: March 15, 1961, 61 y.o.   MRN: [id number]  Reason for Consult: No chief complaint on file.   Referred by Wanda Plump, MD  Subjective:     HPI:  James Boone is a 61 y.o. male with a history of a healed medial ankle ulcer.  He has been compliant with compression stockings.  He now follows up after left greater saphenous vein ablation with duplex study today. No complaints today.  He remains compliant with compression stockings.  Past Medical History:  Diagnosis Date   Abnormal liver function test    Acquired cavovarus deformity of right foot 2016   Dr. Victorino Dike, using boot modification and [place] brace   ADD (attention deficit disorder)    ADD (attention deficit disorder)    Allergic rhinitis    Arthritis    Back pain    lower   Complete rotator cuff tear of left shoulder 07/12/2017   Depression    Hemorrhoid    Hypercholesteremia    Hyperglycemia    Hyperlipidemia    Hypertension    Hypogonadism male    Peripheral edema    Sleep apnea 2005   dx in 2005 , on CPAP setting of 13   Varicose veins    both legs   Family History  Problem Relation Age of Onset   Depression Sister        no suicide    Stroke Father        ag ~ 40   Coronary artery disease Father        smoker, MI and a CABG at 16 y/o   Colon cancer Neg Hx    Prostate cancer Neg Hx    Diabetes Neg Hx    Past Surgical History:  Procedure Laterality Date   ENDOVENOUS ABLATION SAPHENOUS VEIN W/ LASER Left 10/06/2021   endovenous laser ablation left greater saphenous vein by Lemar Livings MD   HERNIA REPAIR  05/23/1999   abdominal hernia   KNEE ARTHROSCOPY  05/22/2008   left, Dr.Geoffrey   KNEE ARTHROSCOPY  05/22/2006   right, Dr.Geoffrey   SHOULDER ARTHROSCOPY WITH ROTATOR CUFF REPAIR AND SUBACROMIAL DECOMPRESSION Left 07/12/2017   Procedure: LEFT SHOULDER ARTHROSCOPY DEBRIDEMENT, ACROMIOPLASTY, ROTATOR CUFF REPAIR;  Surgeon: Teryl Lucy, MD;  Location: [facility];  Service: Orthopedics;  Laterality: Left;   TOTAL ANKLE REPLACEMENT Right 05/22/2014   TOTAL KNEE ARTHROPLASTY Left 12/30/2012   Procedure: LEFT TOTAL KNEE ARTHROPLASTY;  Surgeon: Loanne Drilling, MD;  Location: [facility];  Service: Orthopedics;  Laterality: Left;   TOTAL KNEE ARTHROPLASTY Right 03/16/2014   Procedure: RIGHT TOTAL KNEE ARTHROPLASTY;  Surgeon: Loanne Drilling, MD;  Location: [facility];  Service: Orthopedics;  Laterality: Right;   VASECTOMY  05/22/1988   wisdom teeth extractiion      Short Social History:  Social History   Tobacco Use   Smoking status: Never   Smokeless tobacco: Never  Substance Use Topics   Alcohol use: Yes    Comment: socially     No Known Allergies  Current Outpatient Medications  Medication Sig Dispense Refill   ascorbic acid (VITAMIN C) 500 MG tablet Take by mouth.     aspirin EC 81 MG tablet Take 81 mg by mouth daily.     atorvastatin (LIPITOR) 10 MG tablet TAKE 1 TABLET BY MOUTH EVERY DAY 90 tablet 1   B-D 3CC LUER-LOK SYR 23GX1-1/2 23G X 1-1/2" 3 ML MISC USE  AS DIRECTED FOR INTRAMUSCULAR TESTOSTERONE INJECTIONS 100 each 11   buPROPion (WELLBUTRIN XL) 300 MG 24 hr tablet TAKE 1 TABLET BY MOUTH EVERY DAY 90 tablet 1   Cyanocobalamin (VITAMIN B 12 PO) Take by mouth.     lamoTRIgine (LAMICTAL) 150 MG tablet TAKE 1 & 1/2 TABLETS BY MOUTH ONCE A DAY 45 tablet 5   LORazepam (ATIVAN) 1 MG tablet Take 2 tablets 30 minutes prior to leaving house on day of office surgery.  Bring third tablet with you to office on day of office surgery. 3 tablet 0   Multiple Vitamin (MULTIVITAMIN ADULT PO) Take by mouth.     sertraline (ZOLOFT) 100 MG tablet TAKE 1 TABLET BY MOUTH ONCE DAILY 90 tablet 3   SYRINGE-NEEDLE, DISP, 3 ML (B-D 3CC LUER-LOK SYR 23GX1-1/2) 23G X 1-1/2" 3 ML MISC Use as directed for testosterone injections 100 each 0   testosterone cypionate (DEPOTESTOSTERONE CYPIONATE) 200 MG/ML injection Inject 1 ml every 2 weeks as directed (single  use vial) 10 mL 3   No current facility-administered medications for this visit.    Review of Systems  Constitutional:  Constitutional negative. HENT: HENT negative.  Eyes: Eyes negative.  Respiratory: Respiratory negative.  Cardiovascular: Cardiovascular negative.  GI: Gastrointestinal negative.  Musculoskeletal: Musculoskeletal negative.  Skin: Skin negative.       Healed left leg ulcer Neurological: Neurological negative. Hematologic: Hematologic/lymphatic negative.  Psychiatric: Psychiatric negative.       Objective:  Objective   Vitals:   10/20/21 1027  BP: 137/78  Pulse: 72  Resp: 16  Temp: 98.2 F (36.8 C)  SpO2: 95%     Physical Exam HENT:     Head: Normocephalic.     Nose: Nose normal.  Eyes:     Pupils: Pupils are equal, round, and reactive to light.  Cardiovascular:     Rate and Rhythm: Normal rate.     Pulses: Normal pulses.  Pulmonary:     Effort: Pulmonary effort is normal.  Abdominal:     General: Abdomen is flat.     Palpations: Abdomen is soft.  Musculoskeletal:        General: Normal range of motion.     Cervical back: Normal range of motion.     Right lower leg: No edema.     Left lower leg: No edema.  Skin:    General: Skin is warm.     Capillary Refill: Capillary refill takes less than 2 seconds.     Comments: Skin changes consistent with C4 venous disease bilaterally  Neurological:     General: No focal deficit present.     Mental Status: He is alert.  Psychiatric:        Mood and Affect: Mood normal.        Behavior: Behavior normal.    Data: RIGHTCompressibilityPhasicitySpontaneityPropertiesThrombus Aging  +-----+---------------+---------+-----------+----------+--------------+  CFV  Full           Yes      Yes                                  +-----+---------------+---------+-----------+----------+--------------+           +-------+---------------+---------+-----------+----------+--------------+  LEFT    CompressibilityPhasicitySpontaneityPropertiesThrombus Aging  +-------+---------------+---------+-----------+----------+--------------+  CFV    Full           Yes      Yes                                  +-------+---------------+---------+-----------+----------+--------------+  SFJ    Full                                                         +-------+---------------+---------+-----------+----------+--------------+  FV ProxFull                                                         +-------+---------------+---------+-----------+----------+--------------+  FV Mid Full                                                         +-------+---------------+---------+-----------+----------+--------------+  GSV    None           No       No         dilated                   +-------+---------------+---------+-----------+----------+--------------+     LEFT:  - Successful ablation of left great saphenous vein     Post Ablation Summary:  - No evidence of deep vein thrombosis from the left common femoral through  the popliteal veins.  - Successful ablation of the left Great Saphenous Vein from the Knee to  within 1.5 cm of the SFJ.         Assessment/Plan:    61 year old male with bilateral C4 venous disease with reflux as the etiology on the left now status post treatments very well no DVT on follow-up study today.  Plan will be to continue edema limiting measures including compression stockings he can follow-up with me on an as-needed basis.    James HarmanBrandon Christopher Steven Veazie MD [facility]

## 2021-11-01 ENCOUNTER — Telehealth: Payer: Self-pay

## 2021-11-01 NOTE — Telephone Encounter (Signed)
Pt called with c/o of an ulcer on LLE after a recent laser ablation and asking for abx. Returned pt's call, two identifiers used. Pt states there is drainage, blisters, an open spot about the size of pt's pinky finger, redness/tenderness around the open area approx the size of a half dollar. Pt is wearing his compression stockings, but is on his feet all day at work. Pt states he is unable to elevate d/t work, unsure about swelling amount. Appt was made for pt based on symptoms. Pt advised that wound would need to be evaluated before any abx would be prescribed. Pt confirmed understanding.

## 2021-11-03 ENCOUNTER — Ambulatory Visit (INDEPENDENT_AMBULATORY_CARE_PROVIDER_SITE_OTHER): Payer: BLUE CROSS/BLUE SHIELD | Admitting: Vascular Surgery

## 2021-11-03 ENCOUNTER — Encounter: Payer: Self-pay | Admitting: Vascular Surgery

## 2021-11-03 VITALS — BP 140/82 | HR 56 | Temp 98.2°F | Resp 18 | Ht 76.0 in | Wt 267.0 lb

## 2021-11-03 DIAGNOSIS — I872 Venous insufficiency (chronic) (peripheral): Secondary | ICD-10-CM | POA: Diagnosis not present

## 2021-11-03 NOTE — Progress Notes (Signed)
Patient ID: James Boone, male   DOB: [birth year], 61 y.o.   MRN: [id number]  Reason for Consult: Ulcer (LLE ankle ulcer x 1 week (reoccurrence))   Referred by Colon Branch, MD  Subjective:     HPI:  James Boone is a 61 y.o. male with a history of venous reflux and C5 venous disease with recent great saphenous vein ablation which he tolerated well.  Unfortunately he has new opening of the wound.  He has been compliant with his compression stockings.  He has not had any fevers.  Wound is draining.  He has not seen any other physicians for this wound.  Past Medical History:  Diagnosis Date   Abnormal liver function test    Acquired cavovarus deformity of right foot 2016   Dr. Doran Durand, using boot modification and [place] brace   ADD (attention deficit disorder)    ADD (attention deficit disorder)    Allergic rhinitis    Arthritis    Back pain    lower   Complete rotator cuff tear of left shoulder 07/12/2017   Depression    Hemorrhoid    Hypercholesteremia    Hyperglycemia    Hyperlipidemia    Hypertension    Hypogonadism male    Peripheral edema    Sleep apnea 2005   dx in 2005 , on CPAP setting of 13   Varicose veins    both legs   Family History  Problem Relation Age of Onset   Depression Sister        no suicide    Stroke Father        ag ~ 23   Coronary artery disease Father        smoker, MI and a CABG at 66 y/o   Colon cancer Neg Hx    Prostate cancer Neg Hx    Diabetes Neg Hx    Past Surgical History:  Procedure Laterality Date   ENDOVENOUS ABLATION SAPHENOUS VEIN W/ LASER Left 10/06/2021   endovenous laser ablation left greater saphenous vein by Servando Snare MD   HERNIA REPAIR  05/23/1999   abdominal hernia   KNEE ARTHROSCOPY  05/22/2008   left, Dr.Geoffrey   KNEE ARTHROSCOPY  05/22/2006   right, Dr.Geoffrey   SHOULDER ARTHROSCOPY WITH ROTATOR CUFF REPAIR AND SUBACROMIAL DECOMPRESSION Left 07/12/2017   Procedure: LEFT SHOULDER ARTHROSCOPY DEBRIDEMENT,  ACROMIOPLASTY, ROTATOR CUFF REPAIR;  Surgeon: Marchia Bond, MD;  Location: [facility];  Service: Orthopedics;  Laterality: Left;   TOTAL ANKLE REPLACEMENT Right 05/22/2014   TOTAL KNEE ARTHROPLASTY Left 12/30/2012   Procedure: LEFT TOTAL KNEE ARTHROPLASTY;  Surgeon: Gearlean Alf, MD;  Location: [facility];  Service: Orthopedics;  Laterality: Left;   TOTAL KNEE ARTHROPLASTY Right 03/16/2014   Procedure: RIGHT TOTAL KNEE ARTHROPLASTY;  Surgeon: Gearlean Alf, MD;  Location: [facility];  Service: Orthopedics;  Laterality: Right;   VASECTOMY  05/22/1988   wisdom teeth extractiion      Short Social History:  Social History   Tobacco Use   Smoking status: Never   Smokeless tobacco: Never  Substance Use Topics   Alcohol use: Yes    Comment: socially     No Known Allergies  Current Outpatient Medications  Medication Sig Dispense Refill   ascorbic acid (VITAMIN C) 500 MG tablet Take by mouth.     aspirin EC 81 MG tablet Take 81 mg by mouth daily.     atorvastatin (LIPITOR) 10 MG tablet TAKE 1 TABLET  BY MOUTH EVERY DAY 90 tablet 1   B-D 3CC LUER-LOK SYR 23GX1-1/2 23G X 1-1/2" 3 ML MISC USE AS DIRECTED FOR INTRAMUSCULAR TESTOSTERONE INJECTIONS 100 each 11   buPROPion (WELLBUTRIN XL) 300 MG 24 hr tablet TAKE 1 TABLET BY MOUTH EVERY DAY 90 tablet 1   Cyanocobalamin (VITAMIN B 12 PO) Take by mouth.     lamoTRIgine (LAMICTAL) 150 MG tablet TAKE 1 & 1/2 TABLETS BY MOUTH ONCE A DAY 45 tablet 5   LORazepam (ATIVAN) 1 MG tablet Take 2 tablets 30 minutes prior to leaving house on day of office surgery.  Bring third tablet with you to office on day of office surgery. (Patient not taking: Reported on 10/20/2021) 3 tablet 0   Multiple Vitamin (MULTIVITAMIN ADULT PO) Take by mouth.     sertraline (ZOLOFT) 100 MG tablet TAKE 1 TABLET BY MOUTH ONCE DAILY 90 tablet 3   SYRINGE-NEEDLE, DISP, 3 ML (B-D 3CC LUER-LOK SYR 23GX1-1/2) 23G X 1-1/2" 3 ML MISC Use as directed for testosterone injections  100 each 0   testosterone cypionate (DEPOTESTOSTERONE CYPIONATE) 200 MG/ML injection Inject 1 ml every 2 weeks as directed (single use vial) 10 mL 3   No current facility-administered medications for this visit.    Review of Systems  Constitutional:  Constitutional negative. HENT: HENT negative.  Eyes: Eyes negative.  Cardiovascular: Positive for leg swelling.  GI: Gastrointestinal negative.  Musculoskeletal: Musculoskeletal negative.  Skin: Positive for wound.  Neurological: Neurological negative. Hematologic: Hematologic/lymphatic negative.  Psychiatric: Psychiatric negative.        Objective:  Objective   Vitals:   11/03/21 1000  BP: 140/82  Pulse: (!) 56  Resp: 18  Temp: 98.2 F (36.8 C)  TempSrc: Temporal  Weight: 267 lb (121.1 kg)  Height: 6\' 4"  (1.93 m)   Body mass index is 32.5 kg/m.  Physical Exam HENT:     Head: Normocephalic.  Cardiovascular:     Pulses: Normal pulses.  Abdominal:     General: Abdomen is flat.  Musculoskeletal:     Comments: Minimal bilateral lower extremity edema  Skin:    Comments: Skin changes consistent with C4 venous disease bilaterally  Neurological:     General: No focal deficit present.     Mental Status: He is alert.  Psychiatric:        Mood and Affect: Mood normal.      Data: SSV Pop Fossa           yes    >500 ms      0.48                        +--------------+---------+------+-----------+------------+----------------+   SSV prox calf           yes    >500 ms      0.46    chronic  thrombus  +--------------+---------+------+-----------+------------+----------------+   SSV mid calf            yes    >500 ms      0.63                        +--------------+---------+------+-----------+------------+----------------+      Assessment/Plan:    61 year old male with C6 venous disease.  He has undergone left greater saphenous vein ablation now has recurrent ulceration.  Plan will be for small  saphenous vein ablation.  He will continue local wound care with absorbent dressing and compression stockings and  we will refer him to the wound care center as well.  I have reviewed his previous lower extremity reflux studies and have also evaluated the small saphenous vein at bedside today which demonstrates a large refluxing vein measuring greater than half centimeter for a very long segment with multiple varicosities coming off of the mid segment and then joining the popliteal vein but a suitable segment for ablation.  I discussed the risks of small saphenous vein ablation on the left including DVT and nerve injury with the patient he demonstrates good understanding     Maeola Harman MD [facility]

## 2021-11-10 ENCOUNTER — Encounter (HOSPITAL_BASED_OUTPATIENT_CLINIC_OR_DEPARTMENT_OTHER): Payer: BLUE CROSS/BLUE SHIELD | Attending: Internal Medicine | Admitting: Internal Medicine

## 2021-11-10 ENCOUNTER — Other Ambulatory Visit (HOSPITAL_COMMUNITY): Payer: Self-pay

## 2021-11-10 DIAGNOSIS — I87323 Chronic venous hypertension (idiopathic) with inflammation of bilateral lower extremity: Secondary | ICD-10-CM

## 2021-11-10 DIAGNOSIS — I872 Venous insufficiency (chronic) (peripheral): Secondary | ICD-10-CM | POA: Insufficient documentation

## 2021-11-10 NOTE — Progress Notes (Signed)
SUDEEP, SCHEIBEL ([id number]) Visit Report for 11/10/2021 Abuse Risk Screen Details Patient Name: Date of Service: James Boone 11/10/2021 8:00 A M Medical Record Number: [id number] Patient Account Number: [id number] Date of Birth/Sex: Treating RN: 10/26/1960 (61 y.o. Tammy Sours Primary Care Ionna Avis: [name] Other Clinician: Referring Danika Kluender: Treating Lou Irigoyen/Extender: Susette Racer Weeks in Treatment: 0 Abuse Risk Screen Items Answer ABUSE RISK SCREEN: Has anyone close to you tried to hurt or harm you recentlyo No Do you feel uncomfortable with anyone in your familyo No Has anyone forced you do things that you didnt want to doo No Electronic Signature(s) Signed: 11/10/2021 5:10:40 PM By: [name] RN, BSN Entered By: [name] on 11/10/2021 08:12:22 -------------------------------------------------------------------------------- Activities of Daily Living Details Patient Name: Date of Service: James Boone 11/10/2021 8:00 A M Medical Record Number: [id number] Patient Account Number: [id number] Date of Birth/Sex: Treating RN: Aug 23, 1960 (61 y.o. Tammy Sours Primary Care Keyanni Whittinghill: [name] Other Clinician: Referring Madalynn Pickelsimer: Treating Anabella Capshaw/Extender: Susette Racer Weeks in Treatment: 0 Activities of Daily Living Items Answer Activities of Daily Living (Please select one for each item) Drive Automobile Completely Able T Medications ake Completely Able Use T elephone Completely Able Care for Appearance Completely Able Use T oilet Completely Able Bath / Shower Completely Able Dress Self Completely Able Feed Self Completely Able Walk Completely Able Get In / Out Bed Completely Able Housework Completely Able Prepare Meals Completely Able Handle Money Completely Able Shop for Self Completely Able Electronic Signature(s) Signed: 11/10/2021 5:10:40 PM By: [name] RN, BSN Entered By: [name] on 11/10/2021  08:05:12 -------------------------------------------------------------------------------- Education Screening Details Patient Name: Date of Service: James Boone 11/10/2021 8:00 A M Medical Record Number: [id number] Patient Account Number: [id number] Date of Birth/Sex: Treating RN: [birth year] (61 y.o. Tammy Sours Primary Care Skip Litke: [name] Other Clinician: Referring Epic Tribbett: Treating Riata Ikeda/Extender: Darden Palmer in Treatment: 0 Primary Learner Assessed: Patient Learning Preferences/Education Level/Primary Language Learning Preference: Explanation, Demonstration, Printed Material Highest Education Level: College or Above Preferred Language: English Cognitive Barrier Language Barrier: No Translator Needed: No Memory Deficit: No Emotional Barrier: No Cultural/Religious Beliefs Affecting Medical Care: No Physical Barrier Impaired Vision: Yes Glasses Impaired Hearing: No Decreased Hand dexterity: No Knowledge/Comprehension Knowledge Level: High Comprehension Level: High Ability to understand written instructions: High Ability to understand verbal instructions: High Motivation Anxiety Level: Calm Cooperation: Cooperative Education Importance: Acknowledges Need Interest in Health Problems: Asks Questions Perception: Coherent Willingness to Engage in Self-Management High Activities: Readiness to Engage in Self-Management High Activities: Electronic Signature(s) Signed: 11/10/2021 5:10:40 PM By: [name] RN, BSN Entered By: [name] on 11/10/2021 08:05:33 -------------------------------------------------------------------------------- Fall Risk Assessment Details Patient Name: Date of Service: Isidore Moos, Dolly Rias 11/10/2021 8:00 A M Medical Record Number: [id number] Patient Account Number: [id number] Date of Birth/Sex: Treating RN: December 19, 1960 (61 y.o. Tammy Sours Primary Care Vallerie Hentz: [name] Other Clinician: Referring  Cavan Bearden: Treating Gilles Trimpe/Extender: Susette Racer Weeks in Treatment: 0 Fall Risk Assessment Items Have you had 2 or more falls in the last 12 monthso 0 No Have you had any fall that resulted in injury in the last 12 monthso 0 No FALLS RISK SCREEN History of falling - immediate or within 3 months 0 No Secondary diagnosis (Do you have 2 or more medical diagnoseso) 0 No Ambulatory aid None/bed rest/wheelchair/nurse 0 Yes Crutches/cane/walker 0 No Furniture 0 No Intravenous therapy Access/Saline/Heparin Lock 0 No Gait/Transferring  Normal/ bed rest/ wheelchair 0 Yes Weak (short steps with or without shuffle, stooped but able to lift head while walking, may seek 0 No support from furniture) Impaired (short steps with shuffle, may have difficulty arising from chair, head down, impaired 0 No balance) Mental Status Oriented to own ability 0 Yes Electronic Signature(s) Signed: 11/10/2021 5:10:40 PM By: [name] RN, BSN Entered By: [name] on 11/10/2021 08:05:39 -------------------------------------------------------------------------------- Foot Assessment Details Patient Name: Date of Service: James Boone 11/10/2021 8:00 A M Medical Record Number: [id number] Patient Account Number: [id number] Date of Birth/Sex: Treating RN: [birth year] (61 y.o. Tammy Sours Primary Care Tylan Briguglio: [name] Other Clinician: Referring Arlando Leisinger: Treating Olia Hinderliter/Extender: Susette Racer Weeks in Treatment: 0 Foot Assessment Items Site Locations + = Sensation present, - = Sensation absent, C = Callus, U = Ulcer R = Redness, W = Warmth, M = Maceration, PU = Pre-ulcerative lesion F = Fissure, S = Swelling, D = Dryness Assessment Right: Left: Other Deformity: No No Prior Foot Ulcer: No No Prior Amputation: No No Charcot Joint: No No Ambulatory Status: Gait: Electronic Signature(s) Signed: 11/10/2021 5:10:40 PM By: [name] RN, BSN Entered By:  [name] on 11/10/2021 08:12:49 -------------------------------------------------------------------------------- Nutrition Risk Screening Details Patient Name: Date of Service: James Boone 11/10/2021 8:00 A M Medical Record Number: [id number] Patient Account Number: [id number] Date of Birth/Sex: Treating RN: May 24, 1960 (61 y.o. Tammy Sours Primary Care Daizy Outen: [name] Other Clinician: Referring Abenezer Odonell: Treating Rochelle Larue/Extender: Susette Racer Weeks in Treatment: 0 Height (in): Weight (lbs): Body Mass Index (BMI): Nutrition Risk Screening Items Score Screening NUTRITION RISK SCREEN: I have an illness or condition that made me change the kind and/or amount of food I eat 2 Yes I eat fewer than two meals per day 0 No I eat few fruits and vegetables, or milk products 0 No I have three or more drinks of beer, liquor or wine almost every day 0 No I have tooth or mouth problems that make it hard for me to eat 0 No I don't always have enough money to buy the food I need 0 No I eat alone most of the time 0 No I take three or more different prescribed or over-the-counter drugs a day 1 Yes Without wanting to, I have lost or gained 10 pounds in the last six months 0 No I am not always physically able to shop, cook and/or feed myself 0 No Nutrition Protocols Good Risk Protocol Moderate Risk Protocol 0 Provide education on nutrition High Risk Proctocol Risk Level: Moderate Risk Score: 3 Electronic Signature(s) Signed: 11/10/2021 5:10:40 PM By: [name] RN, BSN Entered By: [name] on 11/10/2021 08:05:47

## 2021-11-10 NOTE — Progress Notes (Signed)
RYANT, CALDERAS ([id number]) Visit Report for 11/10/2021 Chief Complaint Document Details Patient Name: Date of Service: Sundra Aland 11/10/2021 8:00 A M Medical Record Number: [id number] Patient Account Number: [id number] Date of Birth/Sex: Treating RN: Jul 31, 1960 (61 y.o. Burnadette Pop, Lauren Primary Care Provider: Kathlene November Other Clinician: Referring Provider: Treating Provider/Extender: Freddi Starr Weeks in Treatment: 0 Information Obtained from: Patient Chief Complaint 11/10/2021; history of wounds on the left lower extremity Electronic Signature(s) Signed: 11/10/2021 9:11:07 AM By: Kalman Shan DO Entered By: Kalman Shan on 11/10/2021 09:02:52 -------------------------------------------------------------------------------- HPI Details Patient Name: Date of Service: Winona Legato, Wyatt Haste. 11/10/2021 8:00 A M Medical Record Number: [id number] Patient Account Number: [id number] Date of Birth/Sex: Treating RN: 1961/03/11 (61 y.o. Erie Noe Primary Care Provider: Kathlene November Other Clinician: Referring Provider: Treating Provider/Extender: Freddi Starr Weeks in Treatment: 0 History of Present Illness HPI Description: 11/10/2021 Mr. Herchel Mcnitt is a 61 year old male with a past medical history of chronic venous insufficiency. He has a history of venous reflux and C5 venous disease with recent great saphenous vein ablation on 10/20/2021 By Dr. Donzetta Matters. He was seen by Dr. Donzetta Matters on 6/15 and noted to have a wound to the left lower extremity. Patient states he has been using Neosporin, keeping the area covered and using his 30-40 mmHg compression stockings. Over the past week the wound has healed. He has no issues or complaints today. Electronic Signature(s) Signed: 11/10/2021 9:11:07 AM By: Kalman Shan DO Entered By: Kalman Shan on 11/10/2021 09:05:10 -------------------------------------------------------------------------------- Physical Exam  Details Patient Name: Date of Service: Sundra Aland 11/10/2021 8:00 A M Medical Record Number: [id number] Patient Account Number: [id number] Date of Birth/Sex: Treating RN: Jun 15, 1960 (61 y.o. Erie Noe Primary Care Provider: Kathlene November Other Clinician: Referring Provider: Treating Provider/Extender: Freddi Starr Weeks in Treatment: 0 Constitutional respirations regular, non-labored and within target range for patient.. Cardiovascular 2+ dorsalis pedis/posterior tibialis pulses. Psychiatric pleasant and cooperative. Notes Left lower extremity: Venous stasis dermatitis with significant hemosiderin staining throughout the leg. No open wounds. Good edema control. Electronic Signature(s) Signed: 11/10/2021 9:11:07 AM By: Kalman Shan DO Entered By: Kalman Shan on 11/10/2021 09:06:36 -------------------------------------------------------------------------------- Physician Orders Details Patient Name: Date of Service: Winona Legato, Wyatt Haste 11/10/2021 8:00 A M Medical Record Number: [id number] Patient Account Number: [id number] Date of Birth/Sex: Treating RN: [birth year] (61 y.o. Hessie Diener Primary Care Provider: Kathlene November Other Clinician: Referring Provider: Treating Provider/Extender: Freddi Starr Weeks in Treatment: 0 Verbal / Phone Orders: No Diagnosis Coding Discharge From [facility] Services Discharge from [facility] Edema Control - Lymphedema / SCD / Other Elevate legs to the level of the heart or above for 30 minutes daily and/or when sitting, a frequency of: Avoid standing for long periods of time. Patient to wear own compression stockings every day. Electronic Signature(s) Signed: 11/10/2021 9:11:07 AM By: Kalman Shan DO Entered By: Kalman Shan on 11/10/2021 09:06:45 -------------------------------------------------------------------------------- Problem List Details Patient Name: Date of Service: Winona Legato, Wyatt Haste  11/10/2021 8:00 A M Medical Record Number: [id number] Patient Account Number: [id number] Date of Birth/Sex: Treating RN: Sep 26, 1960 (62 y.o. Erie Noe Primary Care Provider: Kathlene November Other Clinician: Referring Provider: Treating Provider/Extender: Freddi Starr Weeks in Treatment: 0 Active Problems ICD-10 Encounter Code Description Active Date MDM Diagnosis I87.323 Chronic venous hypertension (idiopathic) with inflammation of bilateral lower 11/10/2021 No Yes extremity Inactive Problems Resolved Problems Electronic Signature(s) Signed: 11/10/2021  9:11:07 AM By: Geralyn Corwin DO Entered By: Geralyn Corwin on 11/10/2021 09:01:11 -------------------------------------------------------------------------------- Progress Note Details Patient Name: Date of Service: Luz Brazen 11/10/2021 8:00 A M Medical Record Number: [id number] Patient Account Number: [id number] Date of Birth/Sex: Treating RN: 28-Sep-1960 (61 y.o. Lucious Groves Primary Care Provider: [name] Other Clinician: Referring Provider: Treating Provider/Extender: Susette Racer Weeks in Treatment: 0 Subjective Chief Complaint Information obtained from Patient 11/10/2021; history of wounds on the left lower extremity History of Present Illness (HPI) 11/10/2021 Mr. Justus Droke is a 61 year old male with a past medical history of chronic venous insufficiency. He has a history of venous reflux and C5 venous disease with recent great saphenous vein ablation on 10/20/2021 By Dr. Randie Heinz. He was seen by Dr. Randie Heinz on 6/15 and noted to have a wound to the left lower extremity. Patient states he has been using Neosporin, keeping the area covered and using his 30-40 mmHg compression stockings. Over the past week the wound has healed. He has no issues or complaints today. Patient History Information obtained from Patient, Chart. Allergies No Known Drug Allergies Family History Unknown  History. Social History Never smoker, Marital Status - Married, Alcohol Use - Never, Drug Use - No History, Caffeine Use - Moderate. Medical History Respiratory Patient has history of Sleep Apnea - CPAP Cardiovascular Patient has history of Hypertension Denies history of Angina, Arrhythmia, Congestive Heart Failure, Coronary Artery Disease, Deep Vein Thrombosis, Hypotension, Myocardial Infarction, Peripheral Arterial Disease, Peripheral Venous Disease, Phlebitis, Vasculitis Gastrointestinal Denies history of Cirrhosis , Colitis, Crohnoos, Hepatitis A, Hepatitis B, Hepatitis C Musculoskeletal Patient has history of Osteoarthritis Medical A Surgical History Notes nd Cardiovascular hyperlipidemia Review of Systems (ROS) Constitutional Symptoms (General Health) Denies complaints or symptoms of Fatigue, Fever, Chills, Marked Weight Change. Eyes Complains or has symptoms of Glasses / Contacts. Denies complaints or symptoms of Dry Eyes, Vision Changes. Ear/Nose/Mouth/Throat Denies complaints or symptoms of Chronic sinus problems or rhinitis. Gastrointestinal Denies complaints or symptoms of Frequent diarrhea, Nausea, Vomiting. Endocrine Denies complaints or symptoms of Heat/cold intolerance. Genitourinary Denies complaints or symptoms of Frequent urination. Integumentary (Skin) Complains or has symptoms of Wounds. Neurologic Denies complaints or symptoms of Numbness/parasthesias. Psychiatric Denies complaints or symptoms of Claustrophobia, Suicidal. Objective Constitutional respirations regular, non-labored and within target range for patient.. Vitals Time Taken: 8:02 AM, Height: 76 in, Source: Stated, Weight: 267 lbs, Source: Stated, BMI: 32.5, Temperature: 98.2 F, Pulse: 65 bpm, Respiratory Rate: 18 breaths/min, Blood Pressure: 153/87 mmHg. Cardiovascular 2+ dorsalis pedis/posterior tibialis pulses. Psychiatric pleasant and cooperative. General Notes: Left lower  extremity: Venous stasis dermatitis with significant hemosiderin staining throughout the leg. No open wounds. Good edema control. Assessment Active Problems ICD-10 Chronic venous hypertension (idiopathic) with inflammation of bilateral lower extremity Patient presents for evaluation of a previous open wound to the medial distal aspect of the left lower extremity. There is epithelization to a previous wound site noted. Surrounding skin intact. He has obvious venous disease due to the significant hemosiderin staining noted. He had a recent great saphenous vein ablation on 6/1 by Dr. Randie Heinz. Unfortunately he developed a new wound 2 weeks after the ablation. This has healed in the past week with continued compression stockings and Neosporin. I recommended continuing compression stockings daily. I encouraged he follow-up with [place] if he develops new wounds. He knows to call with any questions or concerns. 46 minutes was spent on the encounter including face-to-face, EMR review and coordination of care Plan Discharge From [facility] Services:  [facility] Edema Control - Lymphedema / SCD / Other: Elevate legs to the level of the heart or above for 30 minutes daily and/or when sitting, a frequency of: Avoid standing for long periods of time. Patient to wear own compression stockings every day. 1. Follow-up as needed 2. Daily compression stockings Electronic Signature(s) Signed: 11/10/2021 9:11:07 AM By: Geralyn Corwin DO Entered By: Geralyn Corwin on 11/10/2021 09:09:24 -------------------------------------------------------------------------------- HxROS Details Patient Name: Date of Service: Isidore Moos, Dolly Rias. 11/10/2021 8:00 A M Medical Record Number: [id number] Patient Account Number: [id number] Date of Birth/Sex: Treating RN: 02-23-1961 (61 y.o. Tammy Sours Primary Care Provider: [name] Other Clinician: Referring Provider: Treating Provider/Extender: Susette Racer Weeks in Treatment: 0 Information Obtained From Patient Chart Constitutional Symptoms (General Health) Complaints and Symptoms: Negative for: Fatigue; Fever; Chills; Marked Weight Change Eyes Complaints and Symptoms: Positive for: Glasses / Contacts Negative for: Dry Eyes; Vision Changes Ear/Nose/Mouth/Throat Complaints and Symptoms: Negative for: Chronic sinus problems or rhinitis Gastrointestinal Complaints and Symptoms: Negative for: Frequent diarrhea; Nausea; Vomiting Medical History: Negative for: Cirrhosis ; Colitis; Crohns; Hepatitis A; Hepatitis B; Hepatitis C Endocrine Complaints and Symptoms: Negative for: Heat/cold intolerance Genitourinary Complaints and Symptoms: Negative for: Frequent urination Integumentary (Skin) Complaints and Symptoms: Positive for: Wounds Neurologic Complaints and Symptoms: Negative for: Numbness/parasthesias Psychiatric Complaints and Symptoms: Negative for: Claustrophobia; Suicidal Hematologic/Lymphatic Respiratory Medical History: Positive for: Sleep Apnea - CPAP Cardiovascular Medical History: Positive for: Hypertension Negative for: Angina; Arrhythmia; Congestive Heart Failure; Coronary Artery Disease; Deep Vein Thrombosis; Hypotension; Myocardial Infarction; Peripheral Arterial Disease; Peripheral Venous Disease; Phlebitis; Vasculitis Past Medical History Notes: hyperlipidemia Immunological Musculoskeletal Medical History: Positive for: Osteoarthritis Oncologic Immunizations Implantable Devices No devices added Family and Social History Unknown History: Yes; Never smoker; Marital Status - Married; Alcohol Use: Never; Drug Use: No History; Caffeine Use: Moderate; Financial Concerns: No; Food, Clothing or Shelter Needs: No; Support System Lacking: No; Transportation Concerns: No Electronic Signature(s) Signed: 11/10/2021 9:11:07 AM By: Geralyn Corwin DO Signed: 11/10/2021 5:10:40 PM By: [name]  RN, BSN Entered By: [name] on 11/10/2021 08:12:17 -------------------------------------------------------------------------------- SuperBill Details Patient Name: Date of Service: Luz Brazen 11/10/2021 Medical Record Number: [id number] Patient Account Number: [id number] Date of Birth/Sex: Treating RN: Jan 25, 1961 (61 y.o. Tammy Sours Primary Care Provider: [name] Other Clinician: Referring Provider: Treating Provider/Extender: Susette Racer Weeks in Treatment: 0 Diagnosis Coding ICD-10 Codes Code Description [phone] Chronic venous hypertension (idiopathic) with inflammation of bilateral lower extremity Facility Procedures CPT4 Code: [id number] Description: 99213 - WOUND CARE VISIT-LEV 3 EST PT Modifier: Quantity: 1 Physician Procedures : CPT4 Code Description Modifier [id number] - WC PHYS LEVEL 4 - NEW PT ICD-10 Diagnosis Description I87.323 Chronic venous hypertension (idiopathic) with inflammation of bilateral lower extremity Quantity: 1 Electronic Signature(s) Signed: 11/10/2021 9:11:07 AM By: Geralyn Corwin DO Entered By: Geralyn Corwin on 11/10/2021 09:09:45

## 2021-11-10 NOTE — Progress Notes (Signed)
James, Boone ([id number]) Visit Report for 11/10/2021 Allergy List Details Patient Name: Date of Service: James Boone 11/10/2021 8:00 A [place] Medical Record Number: [id number] Patient Account Number: [id number] Date of Birth/Sex: Treating RN: 02-06-1961 (61 y.o. James Boone Primary Care James Boone: James Boone Other Clinician: Referring James Boone: Treating James Boone/Extender: James Boone James Boone: 0 Allergies Active Allergies No Known Drug Allergies Allergy Notes Electronic Signature(s) Signed: 11/10/2021 5:10:40 PM By: [name] RN, BSN Entered By: [name] on 11/10/2021 08:04:49 -------------------------------------------------------------------------------- Arrival Information Details Patient Name: Date of Service: James Boone, James Boone 11/10/2021 8:00 A [place] Medical Record Number: [id number] Patient Account Number: [id number] Date of Birth/Sex: Treating RN: Aug 29, 1960 (61 y.o. James Boone Primary Care [name]: James Boone Other Clinician: Referring James Boone: Treating [name]/Extender: James Boone in Boone: 0 Visit Information Patient Arrived: Ambulatory Arrival Time: 08:03 Accompanied By: self Transfer Assistance: None Patient Identification Verified: Yes Secondary Verification Process Completed: Yes Patient Requires Transmission-Based Precautions: No Patient Has Alerts: No Electronic Signature(s) Signed: 11/10/2021 5:10:40 PM By: [name] RN, BSN Entered By: [name] on 11/10/2021 08:04:31 -------------------------------------------------------------------------------- Clinic Level of Care Assessment Details Patient Name: Date of Service: James Boone 11/10/2021 8:00 A [place] Medical Record Number: [id number] Patient Account Number: [id number] Date of Birth/Sex: Treating RN: 16-Feb-1961 (61 y.o. James Boone Primary Care [name]: James Boone Other Clinician: Referring Camryn Lampson: Treating [name]/Extender:  James Boone James Boone: 0 Clinic Level of Care Assessment Items TOOL 4 Quantity Score X- 1 0 Use when only an EandM is performed on FOLLOW-UP visit ASSESSMENTS - Nursing Assessment / Reassessment X- 1 10 Reassessment of Co-morbidities (includes updates in patient status) X- 1 5 Reassessment of Adherence to Bo[place] Plan ASSESSMENTS - Wound and Skin A ssessment / Reassessment X - Simple Wound Assessment / Reassessment - one wound 1 5 []  - 0 Complex Wound Assessment / Reassessment - multiple wounds []  - 0 Dermatologic / Skin Assessment (not related to wound area) ASSESSMENTS - Focused Assessment X- 1 5 Circumferential Edema Measurements - multi extremities []  - 0 Nutritional Assessment / Counseling / Intervention []  - 0 Lower Extremity Assessment (monofilament, tuning fork, pulses) []  - 0 Peripheral Arterial Disease Assessment (using hand held doppler) ASSESSMENTS - Ostomy and/or Continence Assessment and Care []  - 0 Incontinence Assessment and Management []  - 0 Ostomy Care Assessment and Management (repouching, etc.) PROCESS - Coordination of Care X - Simple Patient / Family Education for ongoing care 1 15 []  - 0 Complex (extensive) Patient / Family Education for ongoing care X- 1 10 Staff obtains , Records, T Results / Process Orders est []  - 0 Staff telephones HHA, Nursing Homes / Clarify orders / etc []  - 0 Routine Transfer to another Facility (non-emergent condition) []  - 0 Routine Hospital Admission (non-emergent condition) X- 1 15 New Admissions / / Ordering NPWT Apligraf, etc. , []  - 0 Emergency Hospital Admission (emergent condition) X- 1 10 Simple Discharge Coordination []  - 0 Complex (extensive) Discharge Coordination PROCESS - Special Needs []  - 0 Pediatric / Minor Patient Management []  - 0 Isolation Patient Management []  - 0 Hearing / Language / Visual special needs []  - 0 Assessment of  Community assistance (transportation, D/C planning, etc.) []  - 0 Additional assistance / Altered mentation []  - 0 Support Surface(s) Assessment (bed, cushion, seat, etc.) INTERVENTIONS - Wound Cleansing / Measurement X - Simple Wound Cleansing - one wound 1 5 []  -  0 Complex Wound Cleansing - multiple wounds []  - 0 Wound Imaging (photographs - any number of wounds) []  - 0 Wound Tracing (instead of photographs) []  - 0 Simple Wound Measurement - one wound []  - 0 Complex Wound Measurement - multiple wounds INTERVENTIONS - Wound Dressings []  - 0 Small Wound Dressing one or multiple wounds []  - 0 Medium Wound Dressing one or multiple wounds []  - 0 Large Wound Dressing one or multiple wounds []  - 0 Application of Medications - topical []  - 0 Application of Medications - injection INTERVENTIONS - Miscellaneous []  - 0 External ear exam []  - 0 Specimen Collection (cultures, biopsies, blood, body fluids, etc.) []  - 0 Specimen(s) / Culture(s) sent or taken to Lab for analysis []  - 0 Patient Transfer (multiple staff / [employer] Service fast streamer / Similar devices) []  - 0 Simple Staple / Suture removal (25 or less) []  - 0 Complex Staple / Suture removal (26 or more) []  - 0 Hypo / Hyperglycemic Management (close monitor of Blood Glucose) []  - 0 Ankle / Brachial Index (ABI) - do not check if billed separately X- 1 5 Vital Signs Has the patient been seen at the hospital within the last three years: Yes Total Score: 85 Level Of Care: New/Established - Level 3 Electronic Signature(s) Signed: 11/10/2021 5:10:40 PM By: Deon Pilling RN, BSN Entered By: Deon Pilling on 11/10/2021 08:37:13 -------------------------------------------------------------------------------- Encounter Discharge Information Details Patient Name: Date of Service: James Boone, James Haste. 11/10/2021 8:00 A [place] Medical Record Number: [id number] Patient Account Number: [id number] Date of Birth/Sex: Treating RN: 1960-12-16 (61 y.o. James Boone Primary Care Graeme Menees: Kathlene November Other Clinician: Referring Ceira Hoeschen: Treating Tavon Magnussen/Extender: James Boone James Boone: 0 Encounter Discharge Information Items Discharge Condition: Stable Ambulatory Status: Ambulatory Discharge Destination: Home Transportation: Private Auto Accompanied By: self Schedule Follow-up Appointment: Yes Clinical Summary of Care: Patient Declined Electronic Signature(s) Signed: 11/10/2021 5:10:40 PM By: Deon Pilling RN, BSN Entered By: Deon Pilling on 11/10/2021 08:37:40 -------------------------------------------------------------------------------- Lower Extremity Assessment Details Patient Name: Date of Service: James Boone 11/10/2021 8:00 A [place] Medical Record Number: [id number] Patient Account Number: [id number] Date of Birth/Sex: Treating RN: [birth year] (61 y.o. James Boone Primary Care Jearl Soto: Kathlene November Other Clinician: Referring Wayde Gopaul: Treating James Boone/Extender: James Boone James Boone: 0 Edema Assessment Assessed: [Left: Yes] [Right: No] Edema: [Left: N] [Right: o] Calf Left: Right: Point of Measurement: 36 cm From Medial Instep 41 cm Ankle Left: Right: Point of Measurement: 10 cm From Medial Instep 26 cm Knee To Floor Left: Right: From Medial Instep 57 cm Electronic Signature(s) Signed: 11/10/2021 5:10:40 PM By: Deon Pilling RN, BSN Entered By: Deon Pilling on 11/10/2021 08:13:26 -------------------------------------------------------------------------------- Multi Wound Chart Details Patient Name: Date of Service: James Boone, James Haste. 11/10/2021 8:00 A [place] Medical Record Number: [id number] Patient Account Number: [id number] Date of Birth/Sex: Treating RN: 11/14/1960 (61 y.o. James Boone Primary Care Kathy Wahid: Kathlene November Other Clinician: Referring Santiago Graf: Treating Mai Longnecker/Extender: James Boone James Boone: 0 Vital  Signs Height(in): 76 Pulse(bpm): 65 Weight(lbs): 267 Blood Pressure(mmHg): 153/87 Body Mass Index(BMI): 32.5 Temperature(F): 98.2 Respiratory Rate(breaths/min): 18 Wound Assessments [place] Notes Electronic Signature(s) Signed: 11/10/2021 9:11:07 AM By: Kalman Shan DO Signed: 11/10/2021 4:05:31 PM By: Rhae Hammock RN Entered By: Kalman Shan on 11/10/2021 09:01:18 -------------------------------------------------------------------------------- Multi-Disciplinary Care Plan Details Patient Name: Date of Service: James Boone, James Haste. 11/10/2021 8:00 A [place] Medical Record Number: [id number] Patient Account Number: [id number] Date of  Birth/Sex: Treating RN: Apr 08, 1961 (61 y.o. James Boone Primary Care Masaru Chamberlin: James Boone Other Clinician: Referring Yaffa Seckman: Treating Emely Fahy/Extender: James Boone James Boone: 0 Active Inactive Electronic Signature(s) Signed: 11/10/2021 5:10:40 PM By: [name] RN, BSN Entered By: [name] on 11/10/2021 08:36:05 -------------------------------------------------------------------------------- Pain Assessment Details Patient Name: Date of Service: James Boone 11/10/2021 8:00 A [place] Medical Record Number: [id number] Patient Account Number: [id number] Date of Birth/Sex: Treating RN: 09/21/1960 (61 y.o. James Boone, James Boone Primary Care Lindwood Mogel: James Boone Other Clinician: Referring Makell Drohan: Treating Elin Seats/Extender: James Boone James Boone: 0 Active Problems Location of Pain Severity and Description of Pain Patient Has Paino No Site Locations Pain Management and Medication Current Pain Management: Medication: No Cold Application: No Rest: No Massage: No Activity: No T.E.N.S.: No Heat Application: No Leg drop or elevation: No Is the Current Pain Management Adequate: Adequate How does your wound impact your activities of daily livingo Sleep: No Bathing: No Appetite:  No Relationship With Others: No Bladder Continence: No Emotions: No Bowel Continence: No Work: No Toileting: No Drive: No Dressing: No Hobbies: No Electronic Signature(s) Signed: 11/10/2021 5:10:40 PM By: [name] RN, BSN Entered By: [name] on 11/10/2021 08:05:59 -------------------------------------------------------------------------------- Patient/Caregiver Education Details Patient Name: Date of Service: James Boone, James N C. 6/22/2023andnbsp8:00 A [place] Medical Record Number: [id number] Patient Account Number: [id number] Date of Birth/Gender: Treating RN: 1960/12/22 (61 y.o. James Boone Primary Care Physician: James Boone Other Clinician: Referring Physician: Treating Physician/Extender: James Boone in [place]: 0 Education Assessment Education Provided To: Patient Education Topics Provided Wound/Skin Impairment: Methods: Explain/Verbal Responses: State content correctly Electronic Signature(s) Signed: 11/10/2021 5:10:40 PM By: [name] RN, BSN Entered By: [name] on 11/10/2021 08:36:15 -------------------------------------------------------------------------------- Vitals Details Patient Name: Date of Service: James Boone, James Boone. 11/10/2021 8:00 A [place] Medical Record Number: [id number] Patient Account Number: [id number] Date of Birth/Sex: Treating RN: 1961-01-15 (61 y.o. James Boone Primary Care Kierston Plasencia: James Boone Other Clinician: Referring Alantra Popoca: Treating Nasire Reali/Extender: James Boone James Boone: 0 Vital Signs Time Taken: 08:02 Temperature (F): 98.2 Height (in): 76 Pulse (bpm): 65 Source: Stated Respiratory Rate (breaths/min): 18 Weight (lbs): 267 Blood Pressure (mmHg): 153/87 Source: Stated Reference Range: 80 - 120 mg / dl Body Mass Index (BMI): 32.5 Electronic Signature(s) Signed: 11/10/2021 5:10:40 PM By: [name] RN, BSN Entered By: [name] on 11/10/2021 97:94:80

## 2021-12-29 ENCOUNTER — Encounter: Payer: BLUE CROSS/BLUE SHIELD | Admitting: Internal Medicine

## 2021-12-30 ENCOUNTER — Telehealth: Payer: Self-pay | Admitting: Internal Medicine

## 2021-12-30 ENCOUNTER — Other Ambulatory Visit (HOSPITAL_COMMUNITY): Payer: Self-pay

## 2021-12-30 NOTE — Telephone Encounter (Signed)
Medication: SYRINGE-NEEDLE, DISP, 3 ML (B-D 3CC LUER-LOK SYR 23GX1-1/2) 23G X 1-1/2" 3 ML MISC [[id number]]   Has the patient contacted their pharmacy? Yes.   Needs new prescription  Preferred Pharmacy (with phone number or street name): [id number] -  [address] [place] [address]  Phone:  [phone]  Fax:  [phone]   Agent: Please be advised that RX refills may take up to 3 business days. We ask that you follow-up with your pharmacy.

## 2022-01-02 MED ORDER — "BD LUER-LOK SYRINGE 23G X 1-1/2"" 3 ML MISC": 2 refills | Status: DC

## 2022-01-02 NOTE — Telephone Encounter (Signed)
Rx sent 

## 2022-01-17 ENCOUNTER — Encounter: Payer: Self-pay | Admitting: Internal Medicine

## 2022-01-25 ENCOUNTER — Other Ambulatory Visit (HOSPITAL_COMMUNITY): Payer: Self-pay

## 2022-02-07 ENCOUNTER — Encounter: Payer: Self-pay | Admitting: Psychiatry

## 2022-02-07 ENCOUNTER — Ambulatory Visit (INDEPENDENT_AMBULATORY_CARE_PROVIDER_SITE_OTHER): Payer: BLUE CROSS/BLUE SHIELD | Admitting: Psychiatry

## 2022-02-07 DIAGNOSIS — F39 Unspecified mood [affective] disorder: Secondary | ICD-10-CM

## 2022-02-07 DIAGNOSIS — F5105 Insomnia due to other mental disorder: Secondary | ICD-10-CM | POA: Diagnosis not present

## 2022-02-07 DIAGNOSIS — F4010 Social phobia, unspecified: Secondary | ICD-10-CM

## 2022-02-07 DIAGNOSIS — F331 Major depressive disorder, recurrent, moderate: Secondary | ICD-10-CM

## 2022-02-07 DIAGNOSIS — F988 Other specified behavioral and emotional disorders with onset usually occurring in childhood and adolescence: Secondary | ICD-10-CM | POA: Diagnosis not present

## 2022-02-07 MED ORDER — BUPROPION HCL ER (XL) 300 MG PO TB24: 300.0000 mg | ORAL_TABLET | Freq: Every day | ORAL | 3 refills | Status: DC

## 2022-02-07 MED ORDER — SERTRALINE HCL 100 MG PO TABS: ORAL_TABLET | Freq: Every day | ORAL | 3 refills | Status: DC

## 2022-02-07 MED ORDER — LAMOTRIGINE 150 MG PO TABS: ORAL_TABLET | ORAL | 3 refills | Status: DC

## 2022-02-07 NOTE — Progress Notes (Signed)
James Landmarkan C Anselmi [id number] 10/17/1960 61 y.o.    Lauraine Rinnearey G Cottle Jr, MD   Subjective:   Patient ID:  James Boone is a 61 y.o. (DOB 10/24/1960) male.  Chief Complaint:  Chief Complaint  Patient presents with   Follow-up   Depression    Attention deficit disorder (ADD) without hyperactivity   Anxiety    Depression        Past medical history includes anxiety.   Anxiety Patient reports no palpitations.     James LandmarkIan C Castiglia presents for follow-up of ADD and bipolar disorder.  visit 08/12/19.  No meds were changed.  It looks like he is remained on the same med regimen for several years.  02/10/2020 appointment with the following noted: No medications were changed. Still working [employer].  Work function is OK.  Short-handed. These are the highest dosage of sertraline 100 and Wellbutrin 150 that he has ever taken. Adderall XR still works for focus.  Takes all 40 mg in the morning.  Misses it if not taken. No major concerns re: mental health nor meds. No sig changes except a little more down over M-in-law died last month after living with them. Pt reports that mood is good for the most part. No sig depression.  Situational anxiety.   Some days are harder. describes anxiety as Minimal. Anxiety symptoms include: Social Anxiety,. Pt reports some irregularity with 4-5 hours typical for years. Enough sleep.No specific interference.  Occ EMA.   No sleepers.  Occ drowsy only if still  . Pt reports that appetite is good. Pt reports that energy is good and good. Concentration is down slightly. Suicidal thoughts:  denied by patient.  No suicide attempts.  Denies mood swings lately.  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  11/09/2020 appointment with the following noted: Doing OK.  Still working and going OK.  Less short handed but works 40 hours weekly. No unusual anxiety or depression.  Still benefits from meds including Adderall.   Sleep with some difficulty falling asleep but not unusual. Tea  in AM and 2 mtn Dews a day.  Pending CPAP use bc had mask problem. No SE. Adderall costing $110/month now.  For 2 XR daily.  05/03/21 appt noted: Not taking Adderall XR DT cost.  Doesn't miss it a whole lot.  Off for mos. No concerns from boss without it or making mistakes.   Mood has been so so and probably a little worse.  Just working to pay bills.  No hobbies or interests now.  No golf bc joints and less spare time.  Gkids 3 days per week and has household things to do.  Work 5 days per week.  Situational stress and doesn't want med changes. On Zoloft 100 mg daily and lamotrigine 225 mg daily, Wellbutrin 150 No SE  No anxiety to speak of.   Using CPAP. Sleep is OK. Plan: No med changes today per his request Continue sertraline 100 and lamotrigine 225 mg daily  But increase to usual dose of  Wellbutrin XL 300 mg daily Stopped DT cost Adderall XR  02/07/22 appt noted: Doing ok.  No serious depression periods.  No mood swings.  Pretty flat.  Not much for enjoyment bc works at work and then at home.  Taking care of gkids too.  No sig hobbies. No golf bc expense. Sleep is normal with CPAP .   Tolerating meds. No anxiety   Last sign depression usually situational and doesn't last long.  Past Psychiatric Medication Trials: Adderall,  sertraline, Wellbutrin,  Lamotrigine  Son Asberger's and stage 4 kidney px  Review of Systems:  Review of Systems  Cardiovascular:  Negative for palpitations.  Musculoskeletal:  Positive for arthralgias.  Skin:        Episode cellulitis  Neurological:  Negative for tremors.  Psychiatric/Behavioral:  Positive for dysphoric mood.     Medications: I have reviewed the patient's current medications.  Current Outpatient Medications  Medication Sig Dispense Refill   ascorbic acid (VITAMIN C) 500 MG tablet Take by mouth.     aspirin EC 81 MG tablet Take 81 mg by mouth daily.     atorvastatin (LIPITOR) 10 MG tablet TAKE 1 TABLET BY MOUTH EVERY DAY 90  tablet 1   Cyanocobalamin (VITAMIN B 12 PO) Take by mouth.     Multiple Vitamin (MULTIVITAMIN ADULT PO) Take by mouth.     SYRINGE-NEEDLE, DISP, 3 ML (B-D 3CC LUER-LOK SYR 23GX1-1/2) 23G X 1-1/2" 3 ML MISC Use as directed for testosterone injections 100 each 2   testosterone cypionate (DEPOTESTOSTERONE CYPIONATE) 200 MG/ML injection Inject 1 ml every 2 weeks as directed (single use vial) 10 mL 3   buPROPion (WELLBUTRIN XL) 300 MG 24 hr tablet Take 1 tablet (300 mg total) by mouth daily. 90 tablet 3   lamoTRIgine (LAMICTAL) 150 MG tablet TAKE 1 & 1/2 TABLETS BY MOUTH ONCE A DAY 135 tablet 3   LORazepam (ATIVAN) 1 MG tablet Take 2 tablets 30 minutes prior to leaving house on day of office surgery.  Bring third tablet with you to office on day of office surgery. (Patient not taking: Reported on 02/07/2022) 3 tablet 0   sertraline (ZOLOFT) 100 MG tablet TAKE 1 TABLET BY MOUTH ONCE DAILY 90 tablet 3   No current facility-administered medications for this visit.    Medication Side Effects: None except dry  Allergies: No Known Allergies  Past Medical History:  Diagnosis Date   Abnormal liver function test    Acquired cavovarus deformity of right foot 2016   Dr. Victorino Dike, using boot modification and [place] brace   ADD (attention deficit disorder)    ADD (attention deficit disorder)    Allergic rhinitis    Arthritis    Back pain    lower   Complete rotator cuff tear of left shoulder 07/12/2017   Depression    Hemorrhoid    Hypercholesteremia    Hyperglycemia    Hyperlipidemia    Hypertension    Hypogonadism male    Peripheral edema    Sleep apnea 2005   dx in 2005 , on CPAP setting of 13   Varicose veins    both legs    Family History  Problem Relation Age of Onset   Depression Sister        no suicide    Stroke Father        ag ~ 29   Coronary artery disease Father        smoker, MI and a CABG at 94 y/o   Colon cancer Neg Hx    Prostate cancer Neg Hx    Diabetes Neg Hx      Social History   Socioeconomic History   Marital status: Married    Spouse name: Not on file   Number of children: 2   Years of education: Not on file   Highest education level: Not on file  Occupational History   Occupation: [employer], sales   Tobacco Use  Smoking status: Never   Smokeless tobacco: Never  Vaping Use   Vaping Use: Never used  Substance and Sexual Activity   Alcohol use: Yes    Comment: socially    Drug use: No   Sexual activity: Yes  Other Topics Concern   Not on file  Social History Narrative   Lives w/ wife, 1 child     Social Determinants of Health   Financial Resource Strain: Not on file  Food Insecurity: Not on file  Transportation Needs: Not on file  Physical Activity: Not on file  Stress: Not on file  Social Connections: Not on file  Intimate Partner Violence: Not on file    Past Medical History, Surgical history, Social history, and Family history were reviewed and updated as appropriate.   Please see review of systems for further details on the patient's review from today.   Objective:   Physical Exam:  There were no vitals taken for this visit.  Physical Exam Constitutional:      General: He is not in acute distress.    Appearance: He is well-developed. He is obese.  Musculoskeletal:        General: Swelling present. No deformity.  Neurological:     Mental Status: He is alert and oriented to person, place, and time.     Cranial Nerves: No dysarthria.     Coordination: Coordination normal.  Psychiatric:        Attention and Perception: Attention and perception normal. He does not perceive auditory or visual hallucinations.        Mood and Affect: Mood is not anxious or depressed. Affect is blunt. Affect is not labile, angry or inappropriate.        Speech: Speech normal.        Behavior: Behavior normal. Behavior is cooperative.        Thought Content: Thought content normal. Thought content is not paranoid or  delusional. Thought content does not include homicidal or suicidal ideation. Thought content does not include suicidal plan.        Cognition and Memory: Cognition and memory normal.        Judgment: Judgment normal.     Comments: Insight intact. No delusions.  Hypoverbal     Lab Review:     Component Value Date/Time   NA 138 11/30/2020 1339   K 4.4 11/30/2020 1339   CL 100 11/30/2020 1339   CO2 30 11/30/2020 1339   GLUCOSE 74 11/30/2020 1339   BUN 24 (H) 11/30/2020 1339   CREATININE 1.18 11/30/2020 1339   CALCIUM 9.6 11/30/2020 1339   PROT 7.2 11/30/2020 1339   ALBUMIN 4.4 11/30/2020 1339   AST 36 11/30/2020 1339   ALT 33 11/30/2020 1339   ALKPHOS 87 11/30/2020 1339   BILITOT 0.7 11/30/2020 1339   GFRNONAA >90 03/18/2014 0125   GFRAA >90 03/18/2014 0125       Component Value Date/Time   WBC 5.0 09/27/2021 0934   RBC 5.25 09/27/2021 0934   HGB 15.4 09/27/2021 0934   HCT 46.2 09/27/2021 0934   PLT 212.0 09/27/2021 0934   MCV 88.1 09/27/2021 0934   MCH 28.7 03/18/2014 0125   MCHC 33.4 09/27/2021 0934   RDW 16.4 (H) 09/27/2021 0934   LYMPHSABS 1.5 09/27/2021 0934   MONOABS 0.5 09/27/2021 0934   EOSABS 0.2 09/27/2021 0934   BASOSABS 0.0 09/27/2021 0934    No results found for: "POCLITH", "LITHIUM"   No results found for: "PHENYTOIN", "PHENOBARB", "VALPROATE", "CBMZ"   .  res Assessment: Plan:    Major depressive disorder, recurrent episode, moderate (HCC) - Plan: buPROPion (WELLBUTRIN XL) 300 MG 24 hr tablet, lamoTRIgine (LAMICTAL) 150 MG tablet, sertraline (ZOLOFT) 100 MG tablet  Attention deficit disorder (ADD) without hyperactivity - Plan: buPROPion (WELLBUTRIN XL) 300 MG 24 hr tablet  Social anxiety disorder - Plan: sertraline (ZOLOFT) 100 MG tablet  Insomnia due to mental condition  Episodic mood disorder (HCC) - Plan: buPROPion (WELLBUTRIN XL) 300 MG 24 hr tablet, sertraline (ZOLOFT) 100 MG tablet  Per Dr. Christena Deem note's patient likely has Asberger's  syndrome plus or minus social anxiety as well.  The patient is satisfied with his current medications.  His answers were brief to questions but he denied most psychiatric symptoms except for some situational anxiety and depressive symptoms.  The primary stressors are probably financial at this point.  Overall he is satisfied with his medication and does not want any medication changes.  Discussed potential benefits, risks, and side effects of stimulants with patient to include increased heart rate, palpitations, insomnia, increased anxiety, increased irritability, or decreased appetite.  Instructed patient to contact office if experiencing any significant tolerability issues.  Option Adzenys instead of  Adderall XR .  Still doing ok at work without it.  No particular mood swings were noted.  Would be better with more recreation. Can't afford Golf. Encourage hobbies.  No med changes today per his request Continue sertraline 100 and lamotrigine 225 mg daily  But increase to usual dose of  Wellbutrin XL 300 mg daily  Follow-up 12 months  Meredith Staggers MD, DFAPA  Please see After Visit Summary for patient specific instructions.  Future Appointments  Date Time Provider Department Center  02/15/2022 10:40 AM Wanda Plump, MD [facility]    No orders of the defined types were placed in this encounter.     -------------------------------

## 2022-02-15 ENCOUNTER — Ambulatory Visit (INDEPENDENT_AMBULATORY_CARE_PROVIDER_SITE_OTHER): Payer: BLUE CROSS/BLUE SHIELD | Admitting: Internal Medicine

## 2022-02-15 ENCOUNTER — Encounter: Payer: Self-pay | Admitting: Internal Medicine

## 2022-02-15 VITALS — BP 134/68 | HR 72 | Temp 98.3°F | Resp 18 | Ht 76.0 in | Wt 274.5 lb

## 2022-02-15 DIAGNOSIS — Z1211 Encounter for screening for malignant neoplasm of colon: Secondary | ICD-10-CM

## 2022-02-15 DIAGNOSIS — Z0000 Encounter for general adult medical examination without abnormal findings: Secondary | ICD-10-CM

## 2022-02-15 DIAGNOSIS — I10 Essential (primary) hypertension: Secondary | ICD-10-CM | POA: Diagnosis not present

## 2022-02-15 DIAGNOSIS — R739 Hyperglycemia, unspecified: Secondary | ICD-10-CM | POA: Diagnosis not present

## 2022-02-15 DIAGNOSIS — E291 Testicular hypofunction: Secondary | ICD-10-CM | POA: Diagnosis not present

## 2022-02-15 DIAGNOSIS — E785 Hyperlipidemia, unspecified: Secondary | ICD-10-CM

## 2022-02-15 LAB — PSA: PSA: 1.24 ng/mL (ref 0.10–4.00)

## 2022-02-15 LAB — TESTOSTERONE: Testosterone: 702.26 ng/dL (ref 300.00–890.00)

## 2022-02-15 LAB — HEMOGLOBIN A1C: Hgb A1c MFr Bld: 5.6 % (ref 4.6–6.5)

## 2022-02-15 NOTE — Patient Instructions (Addendum)
Vaccines I recommend:  Flu shot Shingrix (shingles) COVID-vaccine RSV is a new vaccine, it is an option as well.   GO TO THE LAB : Get the blood work     [place], [place] back for a checkup in 4 to 5 months      Advanced care planning:  Do you have a "Living will", "Valier of attorney" ?   If you already have a living will or healthcare power of attorney, is recommended you bring the copy to be scanned in your chart. The document will be available to all the doctors you see in the system.  If you don't have one, please consider create one.  Advance care planning is a process that supports adults in  understanding and sharing their preferences regarding future medical care.   The patient's preferences are recorded in documents called Advance Directives.    Advanced directives are completed (and can be modified at any time) while the patient is in full mental capacity.   The documentation should be available at all times to the patient, the family and the healthcare providers.   This legal documents direct treatment decision making and/or appoint a surrogate to make the decision if the patient is not capable to do so.    Advance directives can be documented in many types of formats,  documents have names such as:  Lliving will  Durable power of attorney for healthcare (healthcare proxy or healthcare power of attorney)  Combined directives  Physician orders for life-sustaining treatment    More information at:  [email or web address]

## 2022-02-15 NOTE — Progress Notes (Unsigned)
Subjective:    Patient ID: James Boone, male    DOB: 1961-05-07, 61 y.o.   MRN: [id number]  DOS:  02/15/2022 Type of visit - description: CPX  Here for CPX Since the last office visit is doing well and has no major or new concerns Good CPAP compliance No GU symptoms.  Review of Systems See above   Past Medical History:  Diagnosis Date   Abnormal liver function test    Acquired cavovarus deformity of right foot 2016   Dr. Doran Durand, using boot modification and [place] brace   ADD (attention deficit disorder)    ADD (attention deficit disorder)    Allergic rhinitis    Arthritis    Back pain    lower   Complete rotator cuff tear of left shoulder 07/12/2017   Depression    Hemorrhoid    Hypercholesteremia    Hyperglycemia    Hyperlipidemia    Hypertension    Hypogonadism male    Peripheral edema    Sleep apnea 2005   dx in 2005 , on CPAP setting of 13   Varicose veins    both legs    Past Surgical History:  Procedure Laterality Date   ENDOVENOUS ABLATION SAPHENOUS VEIN W/ LASER Left 10/06/2021   endovenous laser ablation left greater saphenous vein by Servando Snare MD   [place]  05/23/1999   abdominal hernia   KNEE ARTHROSCOPY  05/22/2008   left, Dr.Geoffrey   KNEE ARTHROSCOPY  05/22/2006   right, Dr.Geoffrey   SHOULDER ARTHROSCOPY WITH ROTATOR CUFF REPAIR AND SUBACROMIAL DECOMPRESSION Left 07/12/2017   Procedure: LEFT SHOULDER ARTHROSCOPY DEBRIDEMENT, ACROMIOPLASTY, ROTATOR CUFF REPAIR;  Surgeon: Marchia Bond, MD;  Location: [facility];  Service: Orthopedics;  Laterality: Left;   TOTAL ANKLE REPLACEMENT Right 05/22/2014   TOTAL KNEE ARTHROPLASTY Left 12/30/2012   Procedure: LEFT TOTAL KNEE ARTHROPLASTY;  Surgeon: Gearlean Alf, MD;  Location: [facility];  Service: Orthopedics;  Laterality: Left;   TOTAL KNEE ARTHROPLASTY Right 03/16/2014   Procedure: RIGHT TOTAL KNEE ARTHROPLASTY;  Surgeon: Gearlean Alf, MD;  Location: [facility];  Service:  Orthopedics;  Laterality: Right;   VASECTOMY  05/22/1988   wisdom teeth extractiion      Current Outpatient Medications  Medication Instructions   ascorbic acid (VITAMIN C) 500 MG tablet Oral   aspirin EC 81 mg, Oral, Daily   atorvastatin (LIPITOR) 10 MG tablet TAKE 1 TABLET BY MOUTH EVERY DAY   buPROPion (WELLBUTRIN XL) 300 mg, Oral, Daily   Cyanocobalamin (VITAMIN B 12 PO) Take by mouth.   lamoTRIgine (LAMICTAL) 150 MG tablet TAKE 1 & 1/2 TABLETS BY MOUTH ONCE A DAY   LORazepam (ATIVAN) 1 MG tablet Take 2 tablets 30 minutes prior to leaving house on day of office surgery.  Bring third tablet with you to office on day of office surgery.   Multiple Vitamin (MULTIVITAMIN ADULT PO) Oral   sertraline (ZOLOFT) 100 MG tablet TAKE 1 TABLET BY MOUTH ONCE DAILY   SYRINGE-NEEDLE, DISP, 3 ML (B-D 3CC LUER-LOK SYR 23GX1-1/2) 23G X 1-1/2" 3 ML MISC Use as directed for testosterone injections   testosterone cypionate (DEPOTESTOSTERONE CYPIONATE) 200 MG/ML injection Inject 1 ml every 2 weeks as directed (single use vial)       Objective:   Physical Exam BP 134/68   Pulse 72   Temp 98.3 F (36.8 C) (Oral)   Resp 18   Ht 6\' 4"  (1.93 m)   Wt 274 lb 8 oz (  124.5 kg)   SpO2 95%   BMI 33.41 kg/m  General: Well developed, NAD, BMI noted Neck: No  thyromegaly  HEENT:  Normocephalic . Face symmetric, atraumatic Lungs:  CTA B Normal respiratory effort, no intercostal retractions, no accessory muscle use. Heart: RRR,  no murmur.  Abdomen:  Not distended, soft, non-tender. No rebound or rigidity.   Lower extremities: Using compression stockings, no edema Skin: Exposed areas without rash. Not pale. Not jaundice DRE: Normal sphincter tone, brown stool, prostate normal Neurologic:  alert & oriented X3.  Speech normal, gait appropriate for age and unassisted Strength symmetric and appropriate for age.  Psych: Cognition and judgment appear intact.  Cooperative with normal attention span and  concentration.  Behavior appropriate. No anxious or depressed appearing.     Assessment     Assessment Hyperglycemia  HTN- Hyperlipidemia-- lipitor  Depression, ADD. Used to see Dr. Jennelle Human,  [facility] Elevated LFTs Chronic venous insuff w/ stasis dermatitis (sees vascular) DJD OSA -- on CPAP Morbid obesity  Hypogonadism -- f/u pcp FH CAD father , 31, smoker Frequent blood donor PLAN:  Here for CPX Hyperglycemia: Last A1c very good.  Recheck on RTC. HTN: BP is normal, on no BP meds. High cholesterol: On Lipitor, controlled Depression ADD: Follow-up by psychiatry Chronic venous insufficiency: Last visit with vascular 11/03/2021, they noted a recurrent ulceration.  Was referred to the wound care center, offered a small saphenous vein ablation (has not pursued). OSA: Good CPAP compliance Hypogonadism: On testosterone, DRE normal, checking levels, check PSA. Last CBC okay (noting he donates blood frequently). RTC 4 to 5 months   -Td 2023 -Shingrix: d/w pt   -COVID VAX: Booster recommended - RSV discussed - Flu shot: Declines to get today - (+) FH CAD on ASA  -CCS: 08-2009 colonoscopy, because of repeat colonoscopy was an issue elected a Hemoccult last year but today he is choosing to be referred to GI.  Will arrange.   -prostate cancer screening: On HRT, DRE wnl, check psa, no symptoms. -labs:A1c, total testosterone, PSA   -Discussed healthy diet, exercise  -POA: Information provided   ====== Hypogonadism: Last level satisfactory, on Testosterone 1 mL every 2 weeks.  Recheck testosterone level and CBC. Abnormal R clavicle on exam: Do not suspect any pathological process, to be sure we will get x-ray Chronic venous insufficiency: To have L leg ablation soon by vascular surgery Preventive care: Tdap today RTC CPX 3 months

## 2022-02-16 ENCOUNTER — Encounter: Payer: Self-pay | Admitting: Internal Medicine

## 2022-02-16 NOTE — Assessment & Plan Note (Signed)
Here for CPX Hyperglycemia: Last A1c very good.  Recheck on RTC. HTN: BP is normal, on no BP meds. High cholesterol: On Lipitor, controlled Depression ADD: Follow-up by psychiatry Chronic venous insufficiency: Last visit with vascular 11/03/2021, they noted a recurrent ulceration.  Was referred to the wound care center, offered a small saphenous vein ablation (has not pursued so far). OSA: Good CPAP compliance Hypogonadism: On testosterone, DRE normal, checking levels, check PSA. Last CBC okay (noting he donates blood frequently). RTC 4 to 5 months

## 2022-02-16 NOTE — Assessment & Plan Note (Signed)
-  Td 2023 -Shingrix: d/w pt   -COVID VAX: Booster recommended - RSV discussed - Flu shot: Declines to get today - (+) FH CAD on ASA -CCS: 08-2009 colonoscopy, last year  elected a Hemoccult  but today he is choosing to be referred to GI.  Will arrange. -prostate cancer screening: On HRT, DRE wnl, check psa, no symptoms. -labs:A1c, total testosterone, PSA   -Discussed healthy diet, exercise  -POA: Information provided

## 2022-03-03 ENCOUNTER — Other Ambulatory Visit: Payer: Self-pay | Admitting: Internal Medicine

## 2022-03-31 ENCOUNTER — Encounter: Payer: Self-pay | Admitting: Internal Medicine

## 2022-04-08 ENCOUNTER — Telehealth: Payer: Self-pay | Admitting: Internal Medicine

## 2022-04-08 ENCOUNTER — Other Ambulatory Visit (HOSPITAL_COMMUNITY): Payer: Self-pay

## 2022-04-10 NOTE — Telephone Encounter (Signed)
Requesting: testosterone 200mg /mL Contract: N/A UDS: N/A Last Visit: 02/15/22 Next Visit: 07/18/22 Last Refill: 08/30/21 #53mL and 3RF  Please Advise

## 2022-04-10 NOTE — Telephone Encounter (Signed)
PDMP okay, Rx sent 

## 2022-07-18 ENCOUNTER — Encounter: Payer: Self-pay | Admitting: Internal Medicine

## 2022-07-18 ENCOUNTER — Ambulatory Visit: Payer: BLUE CROSS/BLUE SHIELD | Admitting: Internal Medicine

## 2022-07-18 VITALS — BP 130/68 | HR 75 | Temp 98.2°F | Resp 18 | Ht 76.0 in | Wt 264.5 lb

## 2022-07-18 DIAGNOSIS — R634 Abnormal weight loss: Secondary | ICD-10-CM | POA: Diagnosis not present

## 2022-07-18 DIAGNOSIS — R739 Hyperglycemia, unspecified: Secondary | ICD-10-CM

## 2022-07-18 DIAGNOSIS — Z1211 Encounter for screening for malignant neoplasm of colon: Secondary | ICD-10-CM

## 2022-07-18 LAB — TSH: TSH: 3.21 u[IU]/mL (ref 0.35–5.50)

## 2022-07-18 LAB — HEMOGLOBIN A1C: Hgb A1c MFr Bld: 5.7 % (ref 4.6–6.5)

## 2022-07-18 LAB — BASIC METABOLIC PANEL
BUN: 19 mg/dL (ref 6–23)
CO2: 31 mEq/L (ref 19–32)
Calcium: 10.1 mg/dL (ref 8.4–10.5)
Chloride: 101 mEq/L (ref 96–112)
Creatinine, Ser: 1.01 mg/dL (ref 0.40–1.50)
GFR: 79.16 mL/min (ref >60.00)
Glucose, Bld: 77 mg/dL (ref 70–99)
Potassium: 4.4 mEq/L (ref 3.5–5.1)
Sodium: 139 mEq/L (ref 135–145)

## 2022-07-18 NOTE — Progress Notes (Signed)
Subjective:    Patient ID: James Boone, male    DOB: Mar 20, 1961, 62 y.o.   MRN: [id number]  DOS:  07/18/2022 Type of visit - description: Follow-up  Since the last office visit is doing well.  Weight loss noted. At home he also noticed some weight loss although he could not quantify it for me.  He denies nausea vomiting.  No blood in the stool or abdominal pain Appetite is okay. Occasional cough, no sputum production, he thinks related to allergies. No night sweats. Mood is relatively well-controlled.  Wt Readings from Last 3 Encounters:  07/18/22 264 lb 8 oz (120 kg)  02/15/22 274 lb 8 oz (124.5 kg)  11/03/21 267 lb (121.1 kg)    Review of Systems See above   Past Medical History:  Diagnosis Date   Abnormal liver function test    Acquired cavovarus deformity of right foot 2016   Dr. Doran Durand, using boot modification and [place] brace   ADD (attention deficit disorder)    ADD (attention deficit disorder)    Allergic rhinitis    Arthritis    Back pain    lower   Complete rotator cuff tear of left shoulder 07/12/2017   Depression    Hemorrhoid    Hypercholesteremia    Hyperglycemia    Hyperlipidemia    Hypertension    Hypogonadism male    Peripheral edema    Sleep apnea 2005   dx in 2005 , on CPAP setting of 13   Varicose veins    both legs    Past Surgical History:  Procedure Laterality Date   ENDOVENOUS ABLATION SAPHENOUS VEIN W/ LASER Left 10/06/2021   endovenous laser ablation left greater saphenous vein by Servando Snare MD   [place]  05/23/1999   abdominal hernia   KNEE ARTHROSCOPY  05/22/2008   left, Dr.Geoffrey   KNEE ARTHROSCOPY  05/22/2006   right, Dr.Geoffrey   SHOULDER ARTHROSCOPY WITH ROTATOR CUFF REPAIR AND SUBACROMIAL DECOMPRESSION Left 07/12/2017   Procedure: LEFT SHOULDER ARTHROSCOPY DEBRIDEMENT, ACROMIOPLASTY, ROTATOR CUFF REPAIR;  Surgeon: Marchia Bond, MD;  Location: [facility];  Service: Orthopedics;  Laterality:  Left;   TOTAL ANKLE REPLACEMENT Right 05/22/2014   TOTAL KNEE ARTHROPLASTY Left 12/30/2012   Procedure: LEFT TOTAL KNEE ARTHROPLASTY;  Surgeon: Gearlean Alf, MD;  Location: [facility];  Service: Orthopedics;  Laterality: Left;   TOTAL KNEE ARTHROPLASTY Right 03/16/2014   Procedure: RIGHT TOTAL KNEE ARTHROPLASTY;  Surgeon: Gearlean Alf, MD;  Location: [facility];  Service: Orthopedics;  Laterality: Right;   VASECTOMY  05/22/1988   wisdom teeth extractiion      Current Outpatient Medications  Medication Instructions   ascorbic acid (VITAMIN C) 500 MG tablet Oral   aspirin EC 81 mg, Oral, Daily   atorvastatin (LIPITOR) 10 mg, Oral, Daily   buPROPion (WELLBUTRIN XL) 300 mg, Oral, Daily   Cyanocobalamin (VITAMIN B 12 PO) Oral   lamoTRIgine (LAMICTAL) 150 MG tablet TAKE 1 & 1/2 TABLETS BY MOUTH ONCE A DAY   LORazepam (ATIVAN) 1 MG tablet Take 2 tablets 30 minutes prior to leaving house on day of office surgery.  Bring third tablet with you to office on day of office surgery.   Multiple Vitamin (MULTIVITAMIN ADULT PO) Oral   sertraline (ZOLOFT) 100 MG tablet TAKE 1 TABLET BY MOUTH ONCE DAILY   SYRINGE-NEEDLE, DISP, 3 ML (B-D 3CC LUER-LOK SYR 23GX1-1/2) 23G X 1-1/2" 3 ML MISC Use as directed for testosterone injections  testosterone cypionate (DEPOTESTOSTERONE CYPIONATE) 200 MG/ML injection INJECT 1ML EVERY 2 WEEK AS DIRECTED       Objective:   Physical Exam BP 130/68   Pulse 75   Temp 98.2 F (36.8 C) (Oral)   Resp 18   Ht '6\' 4"'$  (1.93 m)   Wt 264 lb 8 oz (120 kg)   SpO2 97%   BMI 32.20 kg/m  General:   Well developed, NAD, BMI noted. HEENT:  Normocephalic . Face symmetric, atraumatic Lungs:  CTA B Normal respiratory effort, no intercostal retractions, no accessory muscle use. Heart: RRR,  no murmur.  Lower extremities: no pretibial edema bilaterally  Skin: Not pale. Not jaundice Neurologic:  alert & oriented X3.  Speech normal, gait appropriate for age and  unassisted Psych--  Cognition and judgment appear intact.  Cooperative with normal attention span and concentration.  Behavior appropriate. No anxious or depressed appearing.      Assessment     Assessment Hyperglycemia  HTN- Hyperlipidemia-- lipitor  Depression, ADD. Used to see Dr. Clovis Pu,  [place] Elevated LFTs Chronic venous insuff w/ stasis dermatitis (sees vascular) DJD OSA -- on CPAP Morbid obesity  Hypogonadism -- f/u pcp FH CAD father , 18, smoker Frequent blood donor   PLAN:  Hyperglycemia: Check A1c Weight loss, states that he feels well, physical exam is benign, review of systems with no red flags.  We are checking TSH, BMP, recommend to monitor weight at home and let me know if the trend continue.  He told me his appetite is okay and he is extremely active at work. Hypogonadism: Last labs satisfactory, no change Preventive care: Vaccine advice provided RTC 01-2023 CPX

## 2022-07-18 NOTE — Assessment & Plan Note (Signed)
Hyperglycemia: Check A1c Weight loss, states that he feels well, physical exam is benign, review of systems with no red flags.  We are checking TSH, BMP, recommend to monitor weight at home and let me know if the trend continue.  He told me his appetite is okay and he is extremely active at work. Hypogonadism: Last labs satisfactory, no change Preventive care: Vaccine advice provided RTC 01-2023 CPX

## 2022-07-18 NOTE — Patient Instructions (Addendum)
Please reach out to gastroenterology at [phone]  Please check your weight at home weekly.  If you notice weight loss in the next few weeks let me know  Vaccines I recommend:  Covid booster Shingrix (shingles) RSV vaccine  Check the  blood pressure regularly BP GOAL is between 110/65 and  135/85. If it is consistently higher or lower, let me know      GO TO THE LAB : Get the blood work     [place], [place] Come back for physical exam by 01-2023

## 2022-11-27 ENCOUNTER — Other Ambulatory Visit: Payer: Self-pay | Admitting: Internal Medicine

## 2023-02-01 ENCOUNTER — Other Ambulatory Visit: Payer: Self-pay | Admitting: Internal Medicine

## 2023-02-02 NOTE — Telephone Encounter (Signed)
PDMP okay, Rx sent. Please call the patient and schedule a follow-up, he is overdue. No further refills without visit.

## 2023-02-02 NOTE — Telephone Encounter (Signed)
Requesting: testosterone 200mg /mL Contract: n/a UDS: n/a Last Visit: 07/18/22 Next Visit: None (had appt 02/16/23, having to be rescheduled because PCP out of office) Last Refill: 04/10/22 # 10mL and 3RF   Please Advise

## 2023-02-07 NOTE — Telephone Encounter (Signed)
Lvm2 sched

## 2023-02-08 ENCOUNTER — Other Ambulatory Visit: Payer: Self-pay | Admitting: Psychiatry

## 2023-02-08 DIAGNOSIS — F4010 Social phobia, unspecified: Secondary | ICD-10-CM

## 2023-02-08 DIAGNOSIS — F331 Major depressive disorder, recurrent, moderate: Secondary | ICD-10-CM

## 2023-02-08 DIAGNOSIS — F39 Unspecified mood [affective] disorder: Secondary | ICD-10-CM

## 2023-02-08 DIAGNOSIS — F988 Other specified behavioral and emotional disorders with onset usually occurring in childhood and adolescence: Secondary | ICD-10-CM

## 2023-02-19 ENCOUNTER — Encounter: Payer: BLUE CROSS/BLUE SHIELD | Admitting: Internal Medicine

## 2023-02-23 ENCOUNTER — Other Ambulatory Visit: Payer: Self-pay | Admitting: Internal Medicine

## 2023-02-23 DIAGNOSIS — Z1212 Encounter for screening for malignant neoplasm of rectum: Secondary | ICD-10-CM

## 2023-02-23 DIAGNOSIS — Z1211 Encounter for screening for malignant neoplasm of colon: Secondary | ICD-10-CM

## 2023-03-13 DIAGNOSIS — Z1212 Encounter for screening for malignant neoplasm of rectum: Secondary | ICD-10-CM | POA: Diagnosis not present

## 2023-03-13 DIAGNOSIS — Z1211 Encounter for screening for malignant neoplasm of colon: Secondary | ICD-10-CM | POA: Diagnosis not present

## 2023-03-27 ENCOUNTER — Encounter: Payer: Self-pay | Admitting: Internal Medicine

## 2023-05-29 ENCOUNTER — Ambulatory Visit: Payer: Self-pay | Admitting: Internal Medicine

## 2023-05-29 NOTE — Telephone Encounter (Signed)
 Pt called and scheduled for tomorrow at 8 with Alfredia Ferguson. Pt could not make it to open slots today which were after 1

## 2023-05-29 NOTE — Progress Notes (Signed)
 Established patient visit   Patient: James Boone   DOB: June 09, 1960   63 y.o. Male  MRN: [id number] Visit Date: 05/30/2023  Today's healthcare provider: Manuelita Flatness, PA-C   Cc. Possible pink eye  Subjective     Pt reports his son had pink eye last week, a few days ago he noticed bilateral red eye, crusting, and discharge. Denies vision changes.   Medications: Outpatient Medications Prior to Visit  Medication Sig   ascorbic acid (VITAMIN C) 500 MG tablet Take by mouth.   aspirin EC 81 MG tablet Take 81 mg by mouth daily.   atorvastatin  (LIPITOR) 10 MG tablet Take 1 tablet (10 mg total) by mouth daily.   buPROPion  (WELLBUTRIN  XL) 300 MG 24 hr tablet TAKE 1 TABLET BY MOUTH EVERY DAY   Cyanocobalamin (VITAMIN B 12 PO) Take by mouth.   Multiple Vitamin (MULTIVITAMIN ADULT PO) Take by mouth.   sertraline  (ZOLOFT ) 100 MG tablet TAKE 1 TABLET BY MOUTH EVERY DAY   SYRINGE-NEEDLE, DISP, 3 ML (B-D 3CC LUER-LOK SYR 23GX1-1/2) 23G X 1-1/2 3 ML MISC Use as directed for testosterone  injections   testosterone  cypionate (DEPOTESTOSTERONE CYPIONATE) 200 MG/ML injection INJECT 1ML EVERY 2 WEEK AS DIRECTED   lamoTRIgine  (LAMICTAL ) 150 MG tablet TAKE 1 & 1/2 TABLETS BY MOUTH ONCE A DAY   [DISCONTINUED] LORazepam  (ATIVAN ) 1 MG tablet Take 2 tablets 30 minutes prior to leaving house on day of office surgery.  Bring third tablet with you to office on day of office surgery. (Patient not taking: Reported on 02/07/2022)   No facility-administered medications prior to visit.    Review of Systems  Constitutional:  Negative for fatigue and fever.  Eyes:  Positive for discharge and redness.  Respiratory:  Negative for cough and shortness of breath.   Cardiovascular:  Negative for chest pain, palpitations and leg swelling.  Neurological:  Negative for dizziness and headaches.       Objective    BP (!) 148/80   Pulse 70   Temp 98 F (36.7 C) (Oral)   Ht 6' 4 (1.93 m)   Wt 270 lb 4 oz  (122.6 kg)   SpO2 99%   BMI 32.90 kg/m    Physical Exam Vitals reviewed.  Constitutional:      Appearance: He is not ill-appearing.  HENT:     Head: Normocephalic.  Eyes:     General:        Right eye: Discharge present.        Left eye: Discharge present.    Conjunctiva/sclera:     Right eye: Right conjunctiva is injected.     Left eye: Left conjunctiva is injected.     Pupils: Pupils are equal, round, and reactive to light.  Cardiovascular:     Rate and Rhythm: Normal rate.  Pulmonary:     Effort: Pulmonary effort is normal. No respiratory distress.  Neurological:     General: No focal deficit present.     Mental Status: He is alert and oriented to person, place, and time.  Psychiatric:        Mood and Affect: Mood normal.        Behavior: Behavior normal.      No results found for any visits on 05/30/23.  Assessment & Plan    Acute bacterial conjunctivitis of both eyes -     Polymyxin B -Trimethoprim ; Place 1 drop into both eyes every 4 (four) hours. For 5 days  Dispense: 10  mL; Refill: 0  Rx polytrim  drops q 4-6 hours x 5-7 days   Return if symptoms worsen or fail to improve.       Manuelita Flatness, PA-C  [facility] [phone] (phone) [phone] (fax)  [facility]

## 2023-05-29 NOTE — Telephone Encounter (Signed)
 Copied from CRM [id number]. Topic: Clinical - Red Word Triage >> May 29, 2023  8:58 AM Robinson DEL wrote: [facility] that prompted transfer to Nurse Triage: Patient states he has conjunctivitis, eyes are red, swollen, discharge. Last night one eye today both eyes.   Chief Complaint: redness in both eyes Symptoms: yellowish discharge slight eyelid swelling Frequency: ongoing since 05/28/2023 Pertinent Negatives: Patient denies fever or pain or vision changes Disposition: [] ED /[] Urgent Care (no appt availability in office) / [] Appointment(In office/virtual)/ []  [facility]/ [] Home Care/ [] Refused Recommended Disposition /[] [facility]/ [x]  Follow-up with PCP Additional Notes: The patient reported that his son was diagnosed and treated for conjunctivitis last week.  Last night, the patient started experiencing redness in one eye and a pus-like discharge.  This morning both eyes are red and have yellowish discharge.  He denied pain or fever.  He reported that he is experiencing normal sinus issues that have been ongoing for greater than 2 weeks.  He is requesting a prescription be sent in.  Routed to pcp for treatment recommendations.   Reason for Disposition  [1] Eye with yellow or green discharge, or eyelashes stick together AND [2] NO PCP standing order to call in antibiotic eye drops   (Exception: [place]; continue triage.)  Answer Assessment - Initial Assessment Questions 1. EYE DISCHARGE: Is the discharge in one or both eyes? What color is it? How much is there? When did the discharge start?      Both eyes - pus like and crusty this morning  2. REDNESS OF SCLERA: Is the redness in one or both eyes? When did the redness start?      Redness in both eyes started last night  3. EYELIDS: Are the eyelids red or swollen? If Yes, ask: How much?      Slightly  4. VISION: Is there any difficulty seeing clearly?      No changes  5. PAIN: Is there any pain? If Yes,  ask: How bad is it? (Scale 1-10; or mild, moderate, severe)    - MILD (1-3): doesn't interfere with normal activities     - MODERATE (4-7): interferes with normal activities or awakens from sleep    - SEVERE (8-10): excruciating pain, unable to do any normal activities       No pain 6. CONTACT LENS: Do you wear contacts?     No  7. OTHER SYMPTOMS: Do you have any other symptoms? (e.g., fever, runny nose, cough)     Sinus issues normal for patient longer than 2 weeks  Protocols used: Eye - Pus or Discharge-A-AH

## 2023-05-30 ENCOUNTER — Encounter: Payer: Self-pay | Admitting: Physician Assistant

## 2023-05-30 ENCOUNTER — Ambulatory Visit: Payer: BLUE CROSS/BLUE SHIELD | Admitting: Physician Assistant

## 2023-05-30 VITALS — BP 148/80 | HR 70 | Temp 98.0°F | Ht 76.0 in | Wt 270.2 lb

## 2023-05-30 DIAGNOSIS — H1033 Unspecified acute conjunctivitis, bilateral: Secondary | ICD-10-CM | POA: Diagnosis not present

## 2023-05-30 MED ORDER — POLYMYXIN B-TRIMETHOPRIM 10000-0.1 UNIT/ML-% OP SOLN: 1.0000 [drp] | OPHTHALMIC | 0 refills | Status: DC

## 2023-06-02 ENCOUNTER — Other Ambulatory Visit: Payer: Self-pay | Admitting: Internal Medicine

## 2023-06-13 ENCOUNTER — Other Ambulatory Visit: Payer: Self-pay

## 2023-06-13 ENCOUNTER — Other Ambulatory Visit: Payer: Self-pay | Admitting: Psychiatry

## 2023-06-13 DIAGNOSIS — F39 Unspecified mood [affective] disorder: Secondary | ICD-10-CM

## 2023-06-13 DIAGNOSIS — F331 Major depressive disorder, recurrent, moderate: Secondary | ICD-10-CM

## 2023-06-13 DIAGNOSIS — F988 Other specified behavioral and emotional disorders with onset usually occurring in childhood and adolescence: Secondary | ICD-10-CM

## 2023-06-13 MED ORDER — BUPROPION HCL ER (XL) 300 MG PO TB24
300.0000 mg | ORAL_TABLET | Freq: Every day | ORAL | 0 refills | Status: DC
Start: 2023-06-13 — End: 2023-08-16

## 2023-06-13 NOTE — Telephone Encounter (Signed)
Please schedule pt for a f.u. LV September 2023, due back in Sept 2024.

## 2023-06-13 NOTE — Telephone Encounter (Signed)
Sent Wellbut XL 300 MG to [address].

## 2023-06-13 NOTE — Telephone Encounter (Signed)
Pt has an appt 08/16/23

## 2023-07-25 ENCOUNTER — Encounter: Payer: BLUE CROSS/BLUE SHIELD | Admitting: Internal Medicine

## 2023-08-16 ENCOUNTER — Encounter: Payer: Self-pay | Admitting: Psychiatry

## 2023-08-16 ENCOUNTER — Other Ambulatory Visit: Payer: Self-pay | Admitting: Psychiatry

## 2023-08-16 ENCOUNTER — Ambulatory Visit: Payer: BLUE CROSS/BLUE SHIELD | Admitting: Psychiatry

## 2023-08-16 DIAGNOSIS — F331 Major depressive disorder, recurrent, moderate: Secondary | ICD-10-CM

## 2023-08-16 DIAGNOSIS — F39 Unspecified mood [affective] disorder: Secondary | ICD-10-CM

## 2023-08-16 DIAGNOSIS — F5105 Insomnia due to other mental disorder: Secondary | ICD-10-CM

## 2023-08-16 DIAGNOSIS — F4010 Social phobia, unspecified: Secondary | ICD-10-CM

## 2023-08-16 DIAGNOSIS — F988 Other specified behavioral and emotional disorders with onset usually occurring in childhood and adolescence: Secondary | ICD-10-CM | POA: Diagnosis not present

## 2023-08-16 DIAGNOSIS — G4733 Obstructive sleep apnea (adult) (pediatric): Secondary | ICD-10-CM

## 2023-08-16 MED ORDER — SERTRALINE HCL 100 MG PO TABS
100.0000 mg | ORAL_TABLET | Freq: Every day | ORAL | 3 refills | Status: AC
Start: 2023-08-16 — End: ?

## 2023-08-16 MED ORDER — LAMOTRIGINE 150 MG PO TABS: 150.0000 mg | ORAL_TABLET | Freq: Every day | ORAL | 3 refills | Status: DC

## 2023-08-16 MED ORDER — BUPROPION HCL ER (XL) 300 MG PO TB24: 300.0000 mg | ORAL_TABLET | Freq: Every day | ORAL | 3 refills | Status: AC

## 2023-08-16 NOTE — Progress Notes (Signed)
 James Boone [id number] 1960/07/15 63 y.o.     Subjective:   Patient ID:  James Boone is a 63 y.o. (DOB 01-11-1961) male.  Chief Complaint:  Chief Complaint  Patient presents with   Follow-up   Depression   Anxiety   ADD    [name] presents for follow-up of ADD and bipolar disorder.  visit 08/12/19.  No meds were changed.  It looks like he is remained on the same med regimen for several years.  02/10/2020 appointment with the following noted: No medications were changed. Still working Sales executive.  Work function is OK.  Short-handed. These are the highest dosage of sertraline 100 and Wellbutrin 150 that he has ever taken. Adderall XR still works for focus.  Takes all 40 mg in the morning.  Misses it if not taken. No major concerns re: mental health nor meds. No sig changes except a little more down over M-in-law died last month after living with them. Pt reports that mood is good for the most part. No sig depression.  Situational anxiety.   Some days are harder. describes anxiety as Minimal. Anxiety symptoms include: Social Anxiety,. Pt reports some irregularity with 4-5 hours typical for years. Enough sleep.No specific interference.  Occ EMA.   No sleepers.  Occ drowsy only if still  . Pt reports that appetite is good. Pt reports that energy is good and good. Concentration is down slightly. Suicidal thoughts:  denied by patient.  No suicide attempts.  Denies mood swings lately.  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  11/09/2020 appointment with the following noted: Doing OK.  Still working and going OK.  Less short handed but works 40 hours weekly. No unusual anxiety or depression.  Still benefits from meds including Adderall.   Sleep with some difficulty falling asleep but not unusual. Tea in AM and 2 mtn Dews a day.  Pending CPAP use bc had mask problem. No SE. Adderall costing $110/month now.  For 2 XR daily.  05/03/21 appt noted: Not taking Adderall XR DT cost.   Doesn't miss it a whole lot.  Off for mos. No concerns from boss without it or making mistakes.   Mood has been so so and probably a little worse.  Just working to pay bills.  No hobbies or interests now.  No golf bc joints and less spare time.  Gkids 3 days per week and has household things to do.  Work 5 days per week.  Situational stress and doesn't want med changes. On Zoloft 100 mg daily and lamotrigine 225 mg daily, Wellbutrin 150 No SE  No anxiety to speak of.   Using CPAP. Sleep is OK. Plan: No med changes today per his request Continue sertraline 100 and lamotrigine 225 mg daily  But increase to usual dose of  Wellbutrin XL 300 mg daily Stopped DT cost Adderall XR  02/07/22 appt noted: Doing ok.  No serious depression periods.  No mood swings.  Pretty flat.  Not much for enjoyment bc works at work and then at home.  Taking care of gkids too.  No sig hobbies. No golf bc expense. Sleep is normal with CPAP .   Tolerating meds. 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  Chronic $ stress. Seems I can't get ahead.   Can't afford to  retire soon.  Supporting son with Asbergers and social anxiety.  He got disability.   Meds help some.  No SE Sleep is affected by CPAP mask broken. Not using for 5-6 mos.  Notices not sleeping as well.  Uses full face mask.  4-5 hours sleep is typical for him. Anxiety not too bad.   Not a lot of motivation but energy a bit better with Wellbutrin.   Last sign depression usually situational and doesn't last long.  Past Psychiatric Medication Trials: Adderall,  sertraline, Wellbutrin,  Lamotrigine  Son Asberger's and stage 4 kidney px  Review of Systems:  Review of Systems  Cardiovascular:  Negative for palpitations.  Musculoskeletal:  Positive for arthralgias.  Skin:        Episode cellulitis   Neurological:  Negative for tremors.  Psychiatric/Behavioral:  Positive for depression and dysphoric mood. The patient is nervous/anxious.     Medications: I have reviewed the patient's current medications.  Current Outpatient Medications  Medication Sig Dispense Refill   ascorbic acid (VITAMIN C) 500 MG tablet Take by mouth.     aspirin EC 81 MG tablet Take 81 mg by mouth daily.     atorvastatin (LIPITOR) 10 MG tablet Take 1 tablet (10 mg total) by mouth daily. 90 tablet 0   Cyanocobalamin (VITAMIN B 12 PO) Take by mouth.     Multiple Vitamin (MULTIVITAMIN ADULT PO) Take by mouth.     SYRINGE-NEEDLE, DISP, 3 ML (B-D 3CC LUER-LOK SYR 23GX1-1/2) 23G X 1-1/2" 3 ML MISC Use as directed for testosterone injections 100 each 2   testosterone cypionate (DEPOTESTOSTERONE CYPIONATE) 200 MG/ML injection INJECT EVERY 2 WEEK AS DIRECTED 3 mL 0   trimethoprim-polymyxin b (POLYTRIM) ophthalmic solution Place 1 drop into both eyes every 4 (four) hours. For 5 days 10 mL 0   buPROPion (WELLBUTRIN XL) 300 MG 24 hr tablet Take 1 tablet (300 mg total) by mouth daily. 90 tablet 3   lamoTRIgine (LAMICTAL) 150 MG tablet Take 1 tablet (150 mg total) by mouth daily. 90 tablet 3   sertraline (ZOLOFT) 100 MG tablet Take 1 tablet (100 mg total) by mouth daily. 90 tablet 3   No current facility-administered medications for this visit.    Medication Side Effects: None except dry  Allergies: No Known Allergies  Past Medical History:  Diagnosis Date   Abnormal liver function test    Acquired cavovarus deformity of right foot 2016   Dr. Victorino Dike, using boot modification and [place] brace   ADD (attention deficit disorder)    ADD (attention deficit disorder)    Allergic rhinitis    Arthritis    Back pain    lower   Complete rotator cuff tear of left shoulder 07/12/2017   Depression    Hemorrhoid    Hypercholesteremia    Hyperglycemia    Hyperlipidemia    Hypertension    Hypogonadism male    Peripheral  edema    Sleep apnea 2005   dx in 2005 , on CPAP setting of 13   Varicose veins    both legs    Family History  Problem Relation Age of Onset   Depression Sister        no suicide    Stroke Father        ag ~ 65   Coronary artery disease Father        smoker, MI and a CABG at 57 y/o   Colon cancer Neg Hx    Prostate  cancer Neg Hx    Diabetes Neg Hx     Social History   Socioeconomic History   Marital status: Married    Spouse name: Not on file   Number of children: 2   Years of education: Not on file   Highest education level: Bachelor's degree (e.g., BA, AB, BS)  Occupational History   Occupation: [employer], sales   Tobacco Use   Smoking status: Never   Smokeless tobacco: Never  Vaping Use   Vaping status: Never Used  Substance and Sexual Activity   Alcohol use: Yes    Comment: socially    Drug use: No   Sexual activity: Yes  Other Topics Concern   Not on file  Social History Narrative   Lives w/ wife, 1 child     Social Drivers of Health   Financial Resource Strain: High Risk (05/29/2023)   Overall Financial Resource Strain (CARDIA)    Difficulty of Paying Living Expenses: Hard  Food Insecurity: Food Insecurity Present (05/29/2023)   Hunger Vital Sign    Worried About Running Out of Food in the Last Year: Sometimes true    Ran Out of Food in the Last Year: Sometimes true  Transportation Needs: No Transportation Needs (05/29/2023)   PRAPARE - Administrator, Civil Service (Medical): No    Lack of Transportation (Non-Medical): No  Physical Activity: Sufficiently Active (05/29/2023)   Exercise Vital Sign    Days of Exercise per Week: 5 days    Minutes of Exercise per Session: 60 min  Stress: Stress Concern Present (05/29/2023)   [employer] of Occupational Health - Occupational Stress Questionnaire    Feeling of Stress : To some extent  Social Connections: Moderately Isolated (05/29/2023)   Social Connection and Isolation Panel [NHANES]     Frequency of Communication with Friends and Family: Never    Frequency of Social Gatherings with Friends and Family: Never    Attends Religious Services: 1 to 4 times per year    Active Member of [employer] or Organizations: No    Attends Engineer, structural: Not on file    Marital Status: Married  Catering manager Violence: Not on file    Past Medical History, Surgical history, Social history, and Family history were reviewed and updated as appropriate.   Please see review of systems for further details on the patient's review from today.   Objective:   Physical Exam:  There were no vitals taken for this visit.  Physical Exam Constitutional:      General: He is not in acute distress.    Appearance: He is well-developed. He is obese.  Musculoskeletal:        General: Swelling present. No deformity.  Neurological:     Mental Status: He is alert and oriented to person, place, and time.     Cranial Nerves: No dysarthria.     Coordination: Coordination normal.  Psychiatric:        Attention and Perception: Attention and perception normal. He does not perceive auditory or visual hallucinations.        Mood and Affect: Mood is not anxious or depressed. Affect is blunt. Affect is not labile, angry or inappropriate.        Speech: Speech normal.        Behavior: Behavior normal. Behavior is cooperative.        Thought Content: Thought content normal. Thought content is not paranoid or delusional. Thought content does not include homicidal  or suicidal ideation. Thought content does not include suicidal plan.        Cognition and Memory: Cognition and memory normal.        Judgment: Judgment normal.     Comments: Insight intact. No delusions.  Hypoverbal Chronic stress.     Lab Review:     Component Value Date/Time   NA 139 07/18/2022 1009   K 4.4 07/18/2022 1009   CL 101 07/18/2022 1009   CO2 31 07/18/2022 1009   GLUCOSE 77 07/18/2022 1009   BUN 19 07/18/2022 1009    CREATININE 1.01 07/18/2022 1009   CALCIUM 10.1 07/18/2022 1009   PROT 7.2 11/30/2020 1339   ALBUMIN 4.4 11/30/2020 1339   AST 36 11/30/2020 1339   ALT 33 11/30/2020 1339   ALKPHOS 87 11/30/2020 1339   BILITOT 0.7 11/30/2020 1339   GFRNONAA >90 03/18/2014 0125   GFRAA >90 03/18/2014 0125       Component Value Date/Time   WBC 5.0 09/27/2021 0934   RBC 5.25 09/27/2021 0934   HGB 15.4 09/27/2021 0934   HCT 46.2 09/27/2021 0934   PLT 212.0 09/27/2021 0934   MCV 88.1 09/27/2021 0934   MCH 28.7 03/18/2014 0125   MCHC 33.4 09/27/2021 0934   RDW 16.4 (H) 09/27/2021 0934   LYMPHSABS 1.5 09/27/2021 0934   MONOABS 0.5 09/27/2021 0934   EOSABS 0.2 09/27/2021 0934   BASOSABS 0.0 09/27/2021 0934    No results found for: "POCLITH", "LITHIUM"   No results found for: "PHENYTOIN", "PHENOBARB", "VALPROATE", "CBMZ"   .res Assessment: Plan:    Major depressive disorder, recurrent episode, moderate (HCC) - Plan: buPROPion (WELLBUTRIN XL) 300 MG 24 hr tablet, lamoTRIgine (LAMICTAL) 150 MG tablet, sertraline (ZOLOFT) 100 MG tablet  Attention deficit disorder (ADD) without hyperactivity - Plan: buPROPion (WELLBUTRIN XL) 300 MG 24 hr tablet  Social anxiety disorder - Plan: sertraline (ZOLOFT) 100 MG tablet  Insomnia due to mental condition  Obstructive sleep apnea  Obstructive sleep apnea syndrome  Episodic mood disorder (HCC) - Plan: buPROPion (WELLBUTRIN XL) 300 MG 24 hr tablet, sertraline (ZOLOFT) 100 MG tablet  Per Dr. Christena Deem note's patient likely has Asberger's syndrome plus or minus social anxiety as well. Chronic $ stress.  The patient is satisfied with his current medications.  His answers were brief to questions but he denied most psychiatric symptoms except for some situational anxiety and depressive symptoms.  The primary stressors are probably financial at this point.  Overall he is satisfied with his medication and does not want any medication changes.  Discussed  potential benefits, risks, and side effects of stimulants with patient to include increased heart rate, palpitations, insomnia, increased anxiety, increased irritability, or decreased appetite.  Instructed patient to contact office if experiencing any significant tolerability issues.  Option Adzenys instead of  Adderall XR .  Still doing ok at work without it.  No particular mood swings were noted.  Would be better with more recreation. Can't afford Golf. Encourage hobbies.  Disc risk without CPAP.  Disc ways to get supplies with limited income.  No med changes today per his request Continue sertraline 100 and lamotrigine 150 mg daily  Wellbutrin XL 300 mg daily  Follow-up 12 months  Meredith Staggers MD, DFAPA  Please see After Visit Summary for patient specific instructions.  Future Appointments  Date Time Provider Department Center  09/04/2023 10:40 AM Wanda Plump, MD [facility]    No orders of the defined types were placed in this encounter.     -------------------------------

## 2023-08-19 LAB — COLOGUARD

## 2023-08-30 ENCOUNTER — Telehealth: Payer: Self-pay | Admitting: Internal Medicine

## 2023-08-30 NOTE — Telephone Encounter (Signed)
 [facility] stated they faxed a corrected cologuard result on 3/30. Stated they are re-faxing as we could not confirm if pcp was aware. They will be making the pt aware and confirmed fax #.

## 2023-08-30 NOTE — Telephone Encounter (Signed)
 Noted.

## 2023-09-02 ENCOUNTER — Other Ambulatory Visit: Payer: Self-pay | Admitting: Internal Medicine

## 2023-09-04 ENCOUNTER — Ambulatory Visit (INDEPENDENT_AMBULATORY_CARE_PROVIDER_SITE_OTHER): Admitting: Internal Medicine

## 2023-09-04 ENCOUNTER — Encounter: Payer: Self-pay | Admitting: Internal Medicine

## 2023-09-04 VITALS — BP 126/76 | HR 76 | Temp 97.8°F | Resp 16 | Ht 76.0 in | Wt 267.4 lb

## 2023-09-04 DIAGNOSIS — I10 Essential (primary) hypertension: Secondary | ICD-10-CM

## 2023-09-04 DIAGNOSIS — Z0000 Encounter for general adult medical examination without abnormal findings: Secondary | ICD-10-CM

## 2023-09-04 DIAGNOSIS — Z0001 Encounter for general adult medical examination with abnormal findings: Secondary | ICD-10-CM

## 2023-09-04 DIAGNOSIS — E785 Hyperlipidemia, unspecified: Secondary | ICD-10-CM | POA: Diagnosis not present

## 2023-09-04 DIAGNOSIS — R739 Hyperglycemia, unspecified: Secondary | ICD-10-CM | POA: Diagnosis not present

## 2023-09-04 DIAGNOSIS — E291 Testicular hypofunction: Secondary | ICD-10-CM

## 2023-09-04 LAB — LIPID PANEL
Cholesterol: 163 mg/dL (ref 0–200)
HDL: 56.9 mg/dL (ref >39.00)
LDL Cholesterol: 67 mg/dL (ref 0–99)
NonHDL: 106.59
Total CHOL/HDL Ratio: 3
Triglycerides: 199 mg/dL — ABNORMAL HIGH (ref 0.0–149.0)
VLDL: 39.8 mg/dL (ref 0.0–40.0)

## 2023-09-04 LAB — CBC WITH DIFFERENTIAL/PLATELET
Basophils Absolute: 0 10*3/uL (ref 0.0–0.1)
Basophils Relative: 0.6 % (ref 0.0–3.0)
Eosinophils Absolute: 0.2 10*3/uL (ref 0.0–0.7)
Eosinophils Relative: 4 % (ref 0.0–5.0)
HCT: 43.7 % (ref 39.0–52.0)
Hemoglobin: 14.5 g/dL (ref 13.0–17.0)
Lymphocytes Relative: 27.5 % (ref 12.0–46.0)
Lymphs Abs: 1.6 10*3/uL (ref 0.7–4.0)
MCHC: 33.1 g/dL (ref 30.0–36.0)
MCV: 85.8 fl (ref 78.0–100.0)
Monocytes Absolute: 0.5 10*3/uL (ref 0.1–1.0)
Monocytes Relative: 8.7 % (ref 3.0–12.0)
Neutro Abs: 3.4 10*3/uL (ref 1.4–7.7)
Neutrophils Relative %: 59.2 % (ref 43.0–77.0)
Platelets: 219 10*3/uL (ref 150.0–400.0)
RBC: 5.1 Mil/uL (ref 4.22–5.81)
RDW: 15.7 % — ABNORMAL HIGH (ref 11.5–15.5)
WBC: 5.7 10*3/uL (ref 4.0–10.5)

## 2023-09-04 LAB — HEMOGLOBIN A1C: Hgb A1c MFr Bld: 5.7 % (ref 4.6–6.5)

## 2023-09-04 LAB — COMPREHENSIVE METABOLIC PANEL WITH GFR
ALT: 20 U/L (ref 0–53)
AST: 23 U/L (ref 0–37)
Albumin: 4.4 g/dL (ref 3.5–5.2)
Alkaline Phosphatase: 97 U/L (ref 39–117)
BUN: 26 mg/dL — ABNORMAL HIGH (ref 6–23)
CO2: 31 meq/L (ref 19–32)
Calcium: 9.4 mg/dL (ref 8.4–10.5)
Chloride: 101 meq/L (ref 96–112)
Creatinine, Ser: 1.03 mg/dL (ref 0.40–1.50)
GFR: 76.71 mL/min (ref >60.00)
Glucose, Bld: 85 mg/dL (ref 70–99)
Potassium: 4 meq/L (ref 3.5–5.1)
Sodium: 137 meq/L (ref 135–145)
Total Bilirubin: 0.5 mg/dL (ref 0.2–1.2)
Total Protein: 6.9 g/dL (ref 6.0–8.3)

## 2023-09-04 LAB — PSA: PSA: 1.09 ng/mL (ref 0.10–4.00)

## 2023-09-04 MED ORDER — TESTOSTERONE CYPIONATE 200 MG/ML IM SOLN: INTRAMUSCULAR | 1 refills | Status: DC

## 2023-09-04 NOTE — Patient Instructions (Addendum)
 INSTRUCTIONS  FOR TODAY  Restart your testosterone shots, prescription sent.  Before you return the Cologuard sample, check your insurance, make sure it is covered  Vaccines I recommend: Covid booster RSV vaccine Shingrix (shingles) Pneumonia shot (PNM 20) Flu shot every fall.  Read the information about the healthcare power of attorney below    GO TO THE LAB : Get the blood work     Next office visit for a checkup in 3 months. Please make an appointment before you leave today Depending on your blood or XRs results it might be necessary to come back sooner     "Health Care Power of attorney" (Also know as a  "Living will" or  Advance care planning documents)  If you already have a living will or healthcare power of attorney, is recommended you bring the copy to be scanned in your chart.   The document will be available to all the doctors you see in the system.  If you are over 66 y/o and don't have the document, please read:  Advance care planning is a process that supports adults in  understanding and sharing their preferences regarding future medical care.  The patient's preferences are recorded in documents called Advance Directives and the can be modified at any time while the patient is in full mental capacity.     More information at: [email or web address]

## 2023-09-04 NOTE — Progress Notes (Unsigned)
 Subjective:    Patient ID: James Boone, male    DOB: 1960-06-29, 63 y.o.   MRN: [id number]  DOS:  09/04/2023 Type of visit - description: CPX, LOV 06-2022  Here for CPX Chronic medical problems addressed. Denies chest pain or difficulty breathing No GI or GU symptoms. Out of testosterone supplements for months  Wt Readings from Last 3 Encounters:  09/04/23 267 lb 6 oz (121.3 kg)  05/30/23 270 lb 4 oz (122.6 kg)  07/18/22 264 lb 8 oz (120 kg)     Review of Systems  Other than above, a 14 point review of systems is negative    Past Medical History:  Diagnosis Date   Abnormal liver function test    Acquired cavovarus deformity of right foot 2016   Dr. Victorino Dike, using boot modification and [place] brace   ADD (attention deficit disorder)    ADD (attention deficit disorder)    Allergic rhinitis    Arthritis    Back pain    lower   Complete rotator cuff tear of left shoulder 07/12/2017   Depression    Hemorrhoid    Hypercholesteremia    Hyperglycemia    Hyperlipidemia    Hypertension    Hypogonadism male    Peripheral edema    Sleep apnea 2005   dx in 2005 , on CPAP setting of 13   Varicose veins    both legs    Past Surgical History:  Procedure Laterality Date   ENDOVENOUS ABLATION SAPHENOUS VEIN W/ LASER Left 10/06/2021   endovenous laser ablation left greater saphenous vein by Lemar Livings MD   HERNIA REPAIR  05/23/1999   abdominal hernia   KNEE ARTHROSCOPY  05/22/2008   left, Dr.Geoffrey   KNEE ARTHROSCOPY  05/22/2006   right, Dr.Geoffrey   SHOULDER ARTHROSCOPY WITH ROTATOR CUFF REPAIR AND SUBACROMIAL DECOMPRESSION Left 07/12/2017   Procedure: LEFT SHOULDER ARTHROSCOPY DEBRIDEMENT, ACROMIOPLASTY, ROTATOR CUFF REPAIR;  Surgeon: Teryl Lucy, MD;  Location: [facility];  Service: Orthopedics;  Laterality: Left;   TOTAL ANKLE REPLACEMENT Right 05/22/2014   TOTAL KNEE ARTHROPLASTY Left 12/30/2012   Procedure: LEFT TOTAL KNEE ARTHROPLASTY;   Surgeon: Loanne Drilling, MD;  Location: [facility];  Service: Orthopedics;  Laterality: Left;   TOTAL KNEE ARTHROPLASTY Right 03/16/2014   Procedure: RIGHT TOTAL KNEE ARTHROPLASTY;  Surgeon: Loanne Drilling, MD;  Location: [facility];  Service: Orthopedics;  Laterality: Right;   VASECTOMY  05/22/1988   wisdom teeth extractiion     Social History   Socioeconomic History   Marital status: Married    Spouse name: Not on file   Number of children: 2   Years of education: Not on file   Highest education level: Bachelor's degree (e.g., BA, AB, BS)  Occupational History   Occupation: [employer], sales   Tobacco Use   Smoking status: Never   Smokeless tobacco: Never  Vaping Use   Vaping status: Never Used  Substance and Sexual Activity   Alcohol use: Yes    Comment: socially    Drug use: No   Sexual activity: Yes  Other Topics Concern   Not on file  Social History Narrative   Lives w/ wife, 1 child     Social Drivers of Health   Financial Resource Strain: High Risk (05/29/2023)   Overall Financial Resource Strain (CARDIA)    Difficulty of Paying Living Expenses: Hard  Food Insecurity: Food Insecurity Present (05/29/2023)   Hunger Vital Sign  Worried About Programme researcher, broadcasting/film/video in the Last Year: Sometimes true    [employer] of Food in the Last Year: Sometimes true  Transportation Needs: No Transportation Needs (05/29/2023)   PRAPARE - Administrator, Civil Service (Medical): No    Lack of Transportation (Non-Medical): No  Physical Activity: Sufficiently Active (05/29/2023)   Exercise Vital Sign    Days of Exercise per Week: 5 days    Minutes of Exercise per Session: 60 min  Stress: Stress Concern Present (05/29/2023)   [employer] of Occupational Health - Occupational Stress Questionnaire    Feeling of Stress : To some extent  Social Connections: Moderately Isolated (05/29/2023)   Social Connection and Isolation Panel [NHANES]    Frequency of Communication with Friends  and Family: Never    Frequency of Social Gatherings with Friends and Family: Never    Attends Religious Services: 1 to 4 times per year    Active Member of [employer] or Organizations: No    Attends Engineer, structural: Not on file    Marital Status: Married  Catering manager Violence: Not on file    Current Outpatient Medications  Medication Instructions   ascorbic acid (VITAMIN C) 500 MG tablet Take by mouth.   aspirin EC 81 mg, Daily   atorvastatin (LIPITOR) 10 mg, Oral, Daily   buPROPion (WELLBUTRIN XL) 300 mg, Oral, Daily   Cyanocobalamin (VITAMIN B 12 PO) Take by mouth.   lamoTRIgine (LAMICTAL) 150 mg, Oral, Daily   Multiple Vitamin (MULTIVITAMIN ADULT PO) Take by mouth.   sertraline (ZOLOFT) 100 mg, Oral, Daily   SYRINGE-NEEDLE, DISP, 3 ML (B-D 3CC LUER-LOK SYR 23GX1-1/2) 23G X 1-1/2" 3 ML MISC Use as directed for testosterone injections   testosterone cypionate (DEPOTESTOSTERONE CYPIONATE) 200 MG/ML injection INJECT 1ML EVERY 2 WEEK AS DIRECTED       Objective:   Physical Exam BP 126/76   Pulse 76   Temp 97.8 F (36.6 C) (Oral)   Resp 16   Ht 6\' 4"  (1.93 m)   Wt 267 lb 6 oz (121.3 kg)   SpO2 96%   BMI 32.55 kg/m  General: Well developed, NAD, BMI noted Neck: No  thyromegaly  HEENT:  Normocephalic . Face symmetric, atraumatic Lungs:  CTA B Normal respiratory effort, no intercostal retractions, no accessory muscle use. Heart: RRR,  no murmur.  Abdomen:  Not distended, soft, non-tender. No rebound or rigidity.   Lower extremities: no pretibial edema bilaterally  Skin: Exposed areas without rash. Not pale. Not jaundice Neurologic:  alert & oriented X3.  Speech normal, gait appropriate for age and unassisted Strength symmetric and appropriate for age.  Psych: Cognition and judgment appear intact.  Cooperative with normal attention span and concentration.  Behavior appropriate. No anxious or depressed appearing.     Assessment      Assessment Hyperglycemia  HTN- Hyperlipidemia-- lipitor  Depression, ADD. Used to see Dr. Toi Foster,  [facility] Elevated LFTs Chronic venous insuff w/ stasis dermatitis (sees vascular) DJD OSA -- on CPAP Obesity, h/o  Morbid obesity  Hypogonadism -- f/u pcp FH CAD father , 6, smoker Frequent blood donor   PLAN:  Here for CPX -Td 2023 -Vaccines are recommended: Flu shot next fall, COVID booster if not done recently, PNM 20, Shingrix.  RSV is an option.  Declined any vaccines today - (+) FH CAD on ASA -CCS: 08-2009 colonoscopy: WNL, was referred to GI  before but did not pursue.  Apparently due to hospital  initiative he got a Cologuard, his sample was apparently rejected.  He just got a new box plans to send the sample.  Encouraged to check with insurance for coverage. -prostate cancer screening: No symptoms, has not been able to refill his testosterone but will start soon.  Check PSA. -labs:CMP FLP CBC A1c PSA -Discussed healthy diet, exercise  -POA: Information provided Other issues addressed today: Hyperglycemia: Check A1c HTN: BP looks good, on no medications. Hyperlipidemia: On atorvastatin, checking labs. Depression, ADD: per psych, symptoms controlled OSA: Has not been using it for the last few months, has a mask issue, finances are limited, cannot get a new mask.  Encouraged to proceed as soon as he can. Hypogonadism: Off HRT for months d/t no follow-up, RF today, RTC 3 months for blood work RTC 3 months

## 2023-09-05 ENCOUNTER — Encounter: Payer: Self-pay | Admitting: Internal Medicine

## 2023-09-05 NOTE — Assessment & Plan Note (Signed)
 Here for CPX -Td 2023 -Vaccines are recommended: Flu shot next fall, COVID booster if not done recently, PNM 20, Shingrix.  RSV is an option.  Declined any vaccines today - (+) FH CAD on ASA -CCS: 08-2009 colonoscopy: WNL, was referred to GI  before but did not pursue.  Apparently due to hospital initiative he got a Cologuard, his sample was apparently rejected.  He just got a new box plans to send the sample.  Encouraged to check with insurance for coverage. -prostate cancer screening: No symptoms, has not been able to refill his testosterone but will start soon.  Check PSA. -labs:CMP FLP CBC A1c PSA -Discussed healthy diet, exercise  -POA: Information provided

## 2023-09-05 NOTE — Assessment & Plan Note (Signed)
 Here for CPX  Other issues addressed today: Hyperglycemia: Check A1c HTN: BP looks good, on no medications. Hyperlipidemia: On atorvastatin, checking labs. Depression, ADD: per psych, symptoms controlled OSA: Has not been using it for the last few months, has a mask issue, finances are limited, cannot get a new mask.  Encouraged to proceed as soon as he can. Hypogonadism: Off HRT for months d/t no follow-up, RF today, RTC 3 months for blood work RTC 3 months

## 2023-09-06 ENCOUNTER — Encounter: Payer: Self-pay | Admitting: Internal Medicine

## 2023-09-12 ENCOUNTER — Other Ambulatory Visit: Payer: Self-pay | Admitting: Internal Medicine

## 2023-09-12 NOTE — Telephone Encounter (Signed)
 Copied from CRM [id number]. Topic: Clinical - Medication Refill >> Sep 12, 2023 10:00 AM Danae Duncans wrote: Most Recent Primary Care Visit:  Provider: Devonna Foley E  Department: [facility]  Visit Type: PHYSICAL  Date: 09/04/2023  Medication: testosterone  cypionate (DEPOTESTOSTERONE CYPIONATE) 200 MG/ML injection  Has the patient contacted their pharmacy? No (Agent: If no, request that the patient contact the pharmacy for the refill. If patient does not wish to contact the pharmacy document the reason why and proceed with request.) (Agent: If yes, when and what did the pharmacy advise?)  Is this the correct pharmacy for this prescription? Yes If no, delete pharmacy and type the correct one.  This is the patient's preferred pharmacy:  [id number] - [place], [place] - [address] [place] [address] Phone: [phone] Fax: [phone]   Has the prescription been filled recently?   Is the patient out of the medication? Yes  Has the patient been seen for an appointment in the last year OR does the patient have an upcoming appointment? Yes  Can we respond through MyChart? Yes  Agent: Please be advised that Rx refills may take up to 3 business days. We ask that you follow-up with your pharmacy.

## 2023-09-13 NOTE — Telephone Encounter (Signed)
Refill sent last week.

## 2023-09-19 ENCOUNTER — Other Ambulatory Visit: Payer: Self-pay | Admitting: Internal Medicine

## 2023-09-19 ENCOUNTER — Other Ambulatory Visit: Payer: Self-pay

## 2023-09-19 MED ORDER — BD LUER-LOK SYRINGE 23G X 1-1/2" 3 ML MISC: 2 refills | Status: AC

## 2023-09-20 DIAGNOSIS — Z1211 Encounter for screening for malignant neoplasm of colon: Secondary | ICD-10-CM | POA: Diagnosis not present

## 2023-09-20 DIAGNOSIS — Z1212 Encounter for screening for malignant neoplasm of rectum: Secondary | ICD-10-CM | POA: Diagnosis not present

## 2023-09-26 LAB — COLOGUARD: COLOGUARD: NEGATIVE

## 2023-09-28 ENCOUNTER — Encounter: Payer: Self-pay | Admitting: Internal Medicine

## 2023-12-04 ENCOUNTER — Ambulatory Visit: Payer: Self-pay | Admitting: Internal Medicine

## 2023-12-04 ENCOUNTER — Ambulatory Visit: Admitting: Family

## 2023-12-04 ENCOUNTER — Encounter: Payer: Self-pay | Admitting: Internal Medicine

## 2023-12-04 ENCOUNTER — Ambulatory Visit: Admitting: Internal Medicine

## 2023-12-04 VITALS — BP 138/78 | HR 73 | Temp 98.0°F | Resp 18 | Ht 76.0 in | Wt 268.2 lb

## 2023-12-04 DIAGNOSIS — E291 Testicular hypofunction: Secondary | ICD-10-CM | POA: Diagnosis not present

## 2023-12-04 DIAGNOSIS — I10 Essential (primary) hypertension: Secondary | ICD-10-CM | POA: Diagnosis not present

## 2023-12-04 LAB — TESTOSTERONE: Testosterone: 830.48 ng/dL (ref 300.00–890.00)

## 2023-12-04 NOTE — Patient Instructions (Signed)
 It was good to see you today    Check the  blood pressure regularly Blood pressure goal:  between 110/65 and  135/85. If it is consistently higher or lower, let me know     GO TO THE LAB :  Get the blood work   Your results will be posted on MyChart with my comments  Next office visit for a checkup in 4 to 5 months Please make an appointment before you leave today

## 2023-12-04 NOTE — Progress Notes (Signed)
 Subjective:    Patient ID: James Boone, male    DOB: 12-24-1960, 63 y.o.   MRN: [id number]  DOS:  12/04/2023 Type of visit - description: Follow-up  Since the last visit is doing well. Reports good med compliance. Denies LUTS. No chest pain or difficulty breathing. Emotionally doing okay.  No suicidal ideas.   Review of Systems See above   Past Medical History:  Diagnosis Date   Abnormal liver function test    Acquired cavovarus deformity of right foot 2016   Dr. Kit, using boot modification and [place]  brace   ADD (attention deficit disorder)    ADD (attention deficit disorder)    Allergic rhinitis    Arthritis    Back pain    lower   Complete rotator cuff tear of left shoulder 07/12/2017   Depression    Hemorrhoid    Hypercholesteremia    Hyperglycemia    Hyperlipidemia    Hypertension    Hypogonadism male    Peripheral edema    Sleep apnea 2005   dx in 2005 , on CPAP setting of 13   Varicose veins    both legs    Past Surgical History:  Procedure Laterality Date   ENDOVENOUS ABLATION SAPHENOUS VEIN W/ LASER Left 10/06/2021   endovenous laser ablation left greater saphenous vein by Penne Colorado MD   HERNIA REPAIR  05/23/1999   abdominal hernia   KNEE ARTHROSCOPY  05/22/2008   left, Dr.Geoffrey   KNEE ARTHROSCOPY  05/22/2006   right, Dr.Geoffrey   SHOULDER ARTHROSCOPY WITH ROTATOR CUFF REPAIR AND SUBACROMIAL DECOMPRESSION Left 07/12/2017   Procedure: LEFT SHOULDER ARTHROSCOPY DEBRIDEMENT, ACROMIOPLASTY, ROTATOR CUFF REPAIR;  Surgeon: Josefina Chew, MD;  Location: [facility];  Service: Orthopedics;  Laterality: Left;   TOTAL ANKLE REPLACEMENT Right 05/22/2014   TOTAL KNEE ARTHROPLASTY Left 12/30/2012   Procedure: LEFT TOTAL KNEE ARTHROPLASTY;  Surgeon: Dempsey LULLA Moan, MD;  Location: [facility];  Service: Orthopedics;  Laterality: Left;   TOTAL KNEE ARTHROPLASTY Right 03/16/2014   Procedure: RIGHT TOTAL KNEE ARTHROPLASTY;  Surgeon: Dempsey Moan LULLA, MD;  Location: [facility];  Service: Orthopedics;  Laterality: Right;   VASECTOMY  05/22/1988   wisdom teeth extractiion      Current Outpatient Medications  Medication Instructions   ascorbic acid (VITAMIN C) 500 MG tablet Take by mouth.   aspirin EC 81 mg, Daily   atorvastatin  (LIPITOR) 10 mg, Oral, Daily   buPROPion  (WELLBUTRIN  XL) 300 mg, Oral, Daily   Cyanocobalamin (VITAMIN B 12 PO) Take by mouth.   lamoTRIgine  (LAMICTAL ) 150 mg, Oral, Daily   Multiple Vitamin (MULTIVITAMIN ADULT PO) Take by mouth.   sertraline  (ZOLOFT ) 100 mg, Oral, Daily   SYRINGE-NEEDLE, DISP, 3 ML (B-D 3CC LUER-LOK SYR 23GX1-1/2) 23G X 1-1/2 3 ML MISC Use as directed for testosterone  injections   testosterone  cypionate (DEPOTESTOSTERONE CYPIONATE) 200 MG/ML injection INJECT 1ML EVERY 2 WEEK AS DIRECTED       Objective:   Physical Exam BP 138/78   Pulse 73   Temp 98 F (36.7 C) (Oral)   Resp 18   Ht 6' 4 (1.93 m)   Wt 268 lb 4 oz (121.7 kg)   SpO2 95%   BMI 32.65 kg/m  General:   Well developed, NAD, BMI noted. HEENT:  Normocephalic . Face symmetric, atraumatic Lungs:  CTA B Normal respiratory effort, no intercostal retractions, no accessory muscle use. Heart: RRR,  no murmur.  Lower extremities: no pretibial edema bilaterally  Skin: Not pale. Not jaundice Neurologic:  alert & oriented X3.  Speech normal, gait appropriate for age and unassisted Psych--  Cognition and judgment appear intact.  Cooperative with normal attention span and concentration.  Behavior appropriate. No anxious or depressed appearing.      Assessment     Assessment Hyperglycemia  HTN- Hyperlipidemia-- lipitor  Depression, ADD.   Dr. Geoffry,  [facility] Elevated LFTs Chronic venous insuff w/ stasis dermatitis (sees vascular) DJD OSA -- on CPAP Obesity, h/o  Morbid obesity  Hypogonadism -- f/u pcp FH CAD father , 70, smoker Frequent blood donor   PLAN:  HTN: BP satisfactory, on no  medications. Hyperlipidemia: Controlled with on atorvastatin . Hypogonadism: On testosterone  injections on Tuesdays.  Checking levels. RTC 4 to 5 months

## 2023-12-04 NOTE — Assessment & Plan Note (Signed)
 HTN: BP satisfactory, on no medications. Hyperlipidemia: Controlled with on atorvastatin . Hypogonadism: On testosterone  injections on Tuesdays.  Checking levels. RTC 4 to 5 months

## 2024-01-15 ENCOUNTER — Ambulatory Visit: Admitting: Medical

## 2024-01-15 VITALS — BP 138/78 | HR 74 | Temp 96.7°F | Resp 18 | Ht 76.0 in | Wt 264.0 lb

## 2024-01-15 DIAGNOSIS — H9313 Tinnitus, bilateral: Secondary | ICD-10-CM

## 2024-01-15 DIAGNOSIS — H6121 Impacted cerumen, right ear: Secondary | ICD-10-CM | POA: Diagnosis not present

## 2024-01-15 DIAGNOSIS — J309 Allergic rhinitis, unspecified: Secondary | ICD-10-CM

## 2024-01-15 MED ORDER — FLUTICASONE PROPIONATE 50 MCG/ACT NA SUSP
2.0000 | Freq: Every day | NASAL | 1 refills | Status: DC
Start: 2024-01-15 — End: 2024-02-11

## 2024-01-15 MED ORDER — LEVOCETIRIZINE DIHYDROCHLORIDE 5 MG PO TABS: 5.0000 mg | ORAL_TABLET | Freq: Every evening | ORAL | 3 refills | Status: DC

## 2024-01-15 NOTE — Progress Notes (Signed)
 Subjective:    Patient ID: James Boone, male    DOB: 1960/11/27, 63 y.o.   MRN: [id number]  HPI  James Boone is a 63 year old male who presents with constant bilateral tinnitus for the past two to three weeks.  He has been experiencing constant ringing in both ears for the past two to three weeks, describing the sound as similar to 'crickets' that change pitch and volume but remain constant. No ear pain, sudden changes in hearing, or dizziness associated with the tinnitus.  He has a history of year-round allergies, which tend to worsen during certain times of the year. However, he is not currently taking any allergy medications. No recent nasal congestion beyond his usual baseline and no sinus pain.  Occasional ha hx rare but no direct asso association with tinnitus. No ha presently.. No history of thyroid  disorders, smoking, or exposure to loud noises or high decibel sounds that could contribute to his symptoms.  He has not had his ears washed out before and reports no prior issues with earwax. He does not notice any difference in the tinnitus between the two ears.       Review of Systems  Constitutional:  Negative for chills, fatigue and fever.  HENT:  Positive for congestion, postnasal drip and tinnitus. Negative for nosebleeds, sinus pain, sore throat and trouble swallowing.   Respiratory:  Negative for cough, chest tightness and wheezing.   Cardiovascular:  Negative for chest pain and palpitations.  Gastrointestinal:  Negative for abdominal pain.  Genitourinary:  Negative for dysuria.  Musculoskeletal:  Negative for back pain, neck pain and neck stiffness.  Skin:  Negative for rash.  Neurological:  Negative for dizziness, weakness and light-headedness.  Hematological:  Negative for adenopathy.  Psychiatric/Behavioral:  Negative for behavioral problems and dysphoric mood.     Past Medical History:  Diagnosis Date   Abnormal liver function test    Acquired cavovarus  deformity of right foot 2016   Dr. Kit, using boot modification and [place]  brace   ADD (attention deficit disorder)    ADD (attention deficit disorder)    Allergic rhinitis    Arthritis    Back pain    lower   Complete rotator cuff tear of left shoulder 07/12/2017   Depression    Hemorrhoid    Hypercholesteremia    Hyperglycemia    Hyperlipidemia    Hypertension    Hypogonadism male    Peripheral edema    Sleep apnea 2005   dx in 2005 , on CPAP setting of 13   Varicose veins    both legs     Social History   Socioeconomic History   Marital status: Married    Spouse name: Not on file   Number of children: 2   Years of education: Not on file   Highest education level: Bachelor's degree (e.g., BA, AB, BS)  Occupational History   Occupation: [employer], sales   Tobacco Use   Smoking status: Never   Smokeless tobacco: Never  Vaping Use   Vaping status: Never Used  Substance and Sexual Activity   Alcohol use: Yes    Comment: socially    Drug use: No   Sexual activity: Yes  Other Topics Concern   Not on file  Social History Narrative   Lives w/ wife, 1 child     Social Drivers of Health   Financial Resource Strain: Medium Risk (11/30/2023)   Overall Financial Resource Strain (CARDIA)  Difficulty of Paying Living Expenses: Somewhat hard  Food Insecurity: Food Insecurity Present (11/30/2023)   Hunger Vital Sign    Worried About Running Out of Food in the Last Year: Sometimes true    Ran Out of Food in the Last Year: Sometimes true  Transportation Needs: No Transportation Needs (11/30/2023)   PRAPARE - Administrator, Civil Service (Medical): No    Lack of Transportation (Non-Medical): No  Physical Activity: Sufficiently Active (11/30/2023)   Exercise Vital Sign    Days of Exercise per Week: 5 days    Minutes of Exercise per Session: 90 min  Stress: Stress Concern Present (11/30/2023)   [employer] of Occupational Health - Occupational  Stress Questionnaire    Feeling of Stress: To some extent  Social Connections: Moderately Isolated (11/30/2023)   Social Connection and Isolation Panel    Frequency of Communication with Friends and Family: Once a week    Frequency of Social Gatherings with Friends and Family: Once a week    Attends Religious Services: More than 4 times per year    Active Member of [employer] or Organizations: No    Attends Engineer, structural: Not on file    Marital Status: Married  Catering manager Violence: Not on file    Past Surgical History:  Procedure Laterality Date   ENDOVENOUS ABLATION SAPHENOUS VEIN W/ LASER Left 10/06/2021   endovenous laser ablation left greater saphenous vein by Penne Colorado MD   HERNIA REPAIR  05/23/1999   abdominal hernia   KNEE ARTHROSCOPY  05/22/2008   left, Dr.Geoffrey   KNEE ARTHROSCOPY  05/22/2006   right, Dr.Geoffrey   SHOULDER ARTHROSCOPY WITH ROTATOR CUFF REPAIR AND SUBACROMIAL DECOMPRESSION Left 07/12/2017   Procedure: LEFT SHOULDER ARTHROSCOPY DEBRIDEMENT, ACROMIOPLASTY, ROTATOR CUFF REPAIR;  Surgeon: Josefina Chew, MD;  Location: [facility];  Service: Orthopedics;  Laterality: Left;   TOTAL ANKLE REPLACEMENT Right 05/22/2014   TOTAL KNEE ARTHROPLASTY Left 12/30/2012   Procedure: LEFT TOTAL KNEE ARTHROPLASTY;  Surgeon: Dempsey LULLA Moan, MD;  Location: [facility];  Service: Orthopedics;  Laterality: Left;   TOTAL KNEE ARTHROPLASTY Right 03/16/2014   Procedure: RIGHT TOTAL KNEE ARTHROPLASTY;  Surgeon: Dempsey Moan LULLA, MD;  Location: [facility];  Service: Orthopedics;  Laterality: Right;   VASECTOMY  05/22/1988   wisdom teeth extractiion      Family History  Problem Relation Age of Onset   Depression Sister        no suicide    Stroke Father        ag ~ 52   Coronary artery disease Father        smoker, MI and a CABG at 49 y/o   Colon cancer Neg Hx    Prostate cancer Neg Hx    Diabetes Neg Hx     No Known Allergies  Current Outpatient  Medications on File Prior to Visit  Medication Sig Dispense Refill   ascorbic acid (VITAMIN C) 500 MG tablet Take by mouth.     aspirin EC 81 MG tablet Take 81 mg by mouth daily.     atorvastatin  (LIPITOR) 10 MG tablet Take 1 tablet (10 mg total) by mouth daily. 30 tablet 0   buPROPion  (WELLBUTRIN  XL) 300 MG 24 hr tablet Take 1 tablet (300 mg total) by mouth daily. 90 tablet 3   Cyanocobalamin (VITAMIN B 12 PO) Take by mouth.     lamoTRIgine  (LAMICTAL ) 150 MG tablet TAKE 1 TABLET BY MOUTH EVERY DAY 90  tablet 3   Multiple Vitamin (MULTIVITAMIN ADULT PO) Take by mouth.     sertraline  (ZOLOFT ) 100 MG tablet Take 1 tablet (100 mg total) by mouth daily. 90 tablet 3   SYRINGE-NEEDLE, DISP, 3 ML (B-D 3CC LUER-LOK SYR 23GX1-1/2) 23G X 1-1/2 3 ML MISC Use as directed for testosterone  injections 100 each 2   testosterone  cypionate (DEPOTESTOSTERONE CYPIONATE) 200 MG/ML injection INJECT 1ML EVERY 2 WEEK AS DIRECTED 10 mL 1   No current facility-administered medications on file prior to visit.    BP 138/78   Pulse 74   Temp (!) 96.7 F (35.9 C) (Temporal)   Resp 18   Ht 6' 4 (1.93 m)   Wt 264 lb (119.7 kg)   SpO2 98%   BMI 32.14 kg/m        Objective:   Physical Exam  General Mental Status- Alert. General Appearance- Not in acute distress.   Skin General: Color- Normal Color. Moisture- Normal Moisture.  Neck  No carotid bruits. No JVD.  Chest and Lung Exam Auscultation: Breath Sounds:-Normal.  Cardiovascular Auscultation:Rythm- Regular. Murmurs & Other Heart Sounds:Auscultation of the heart reveals- No Murmurs.  Abdomen Inspection:-Inspeection Normal. Palpation/Percussion:Note:No mass. Palpation and Percussion of the abdomen reveal- Non Tender, Non Distended + BS, no rebound or guarding.   Neurologic Cranial Nerve exam:- CN III-XII intact(No nystagmus), symmetric smile. Strength:- 5/5 equal and symmetric strength both upper and lower extremities.   Heent- mild nasal  congestion. Mild pnd. Boggy turbtinates. Rt ear small amount of wax present but unable to lavage. TM normal. Left side normal.     Assessment & Plan:   Patient Instructions  Bilateral tinnitus Bilateral tinnitus for 2-3 weeks, constant cricket-like sound. Differential includes allergic rhinitis associated,  ear infections, sinus infections, hypertension, thyroid  disorders, Meniere's disease, cerumen impaction. Possible association with allergies and cerumen impaction. Discussed potential causes including age, noise exposure, lifestyle. Referral to ENT if persistent. - Irrigate right ear to clear cerumen and assess impact on tinnitus.(unfortunatley failed lavage) tm intact/normal post lavage. Verbal consent given - Prescribe Flonase  nasal spray, 2 sprays once daily. - Prescribe Xyzal  with 3 refills. - Request MyChart update in 2 weeks to assess tinnitus persistence. - Refer to ENT if tinnitus persists after 2 weeks.  Right ear cerumen impaction Moderate cerumen impaction, 30 % blockage. Tympanic membrane normal post-attempted wax removal. - Irrigate right ear to attempt cerumen removal. Failed. - Advise patient to report dizziness or discomfort during irrigation.(none reported)  Allergic rhinitis Chronic allergic rhinitis, untreated. Plan includes nasal spray and antihistamine. - Prescribe Flonase  nasal spray, 2 sprays once daily. - Prescribe Xyzal  with 3 refills.  Follow up date to be determined after my chart update. Keep regular follow up with pcp as well.   Abdon Petrosky, PA-C

## 2024-01-15 NOTE — Patient Instructions (Addendum)
 Bilateral tinnitus Bilateral tinnitus for 2-3 weeks, constant cricket-like sound. Differential includes allergic rhinitis associated,  ear infections, sinus infections, hypertension, thyroid  disorders, Meniere's disease, cerumen impaction. Possible association with allergies and cerumen impaction. Discussed potential causes including age, noise exposure, lifestyle. Referral to ENT if persistent. - Irrigate right ear to clear cerumen and assess impact on tinnitus.(unfortunatley failed lavage) tm intact/normal post lavage. Verbal consent given - Prescribe Flonase  nasal spray, 2 sprays once daily. - Prescribe Xyzal  with 3 refills. - Request MyChart update in 2 weeks to assess tinnitus persistence. - Refer to ENT if tinnitus persists after 2 weeks.  Right ear cerumen impaction Moderate cerumen impaction, 30 % blockage. Tympanic membrane normal post-attempted wax removal. - Irrigate right ear to attempt cerumen removal. Failed. - Advise patient to report dizziness or discomfort during irrigation.(none reported)  Allergic rhinitis Chronic allergic rhinitis, untreated. Plan includes nasal spray and antihistamine. - Prescribe Flonase  nasal spray, 2 sprays once daily. - Prescribe Xyzal  with 3 refills.  Follow up date to be determined after my chart update. Keep regular follow up with pcp as well.

## 2024-01-18 ENCOUNTER — Other Ambulatory Visit: Payer: Self-pay | Admitting: Internal Medicine

## 2024-02-11 ENCOUNTER — Other Ambulatory Visit: Payer: Self-pay | Admitting: Medical

## 2024-02-15 ENCOUNTER — Other Ambulatory Visit: Payer: Self-pay | Admitting: Internal Medicine

## 2024-03-07 ENCOUNTER — Telehealth: Payer: Self-pay | Admitting: Internal Medicine

## 2024-03-07 NOTE — Telephone Encounter (Signed)
 PDMP okay, Rx sent

## 2024-03-07 NOTE — Telephone Encounter (Signed)
 Requesting: testosterone   Contract: n/a UDS: n/a  Last Visit: 12/04/23 Next Visit:04/18/24 Last Refill: 09/04/23 #10 and 1RF  Please Advise

## 2024-03-07 NOTE — Telephone Encounter (Signed)
 Pt scheduled 04/08/24- needs to be rescheduled for when PCP returns please.

## 2024-04-08 ENCOUNTER — Ambulatory Visit: Admitting: Internal Medicine

## 2024-04-13 ENCOUNTER — Other Ambulatory Visit: Payer: Self-pay | Admitting: Medical

## 2024-04-21 ENCOUNTER — Other Ambulatory Visit (HOSPITAL_COMMUNITY): Payer: Self-pay

## 2024-04-21 ENCOUNTER — Telehealth: Payer: Self-pay

## 2024-04-21 NOTE — Telephone Encounter (Signed)
 Needs PA for pitavastatin 2mg  please.

## 2024-04-21 NOTE — Telephone Encounter (Signed)
 Pharmacy Patient Advocate Encounter   Received notification from Pt Calls Messages that prior authorization for Pitavastatin 2mg  caps is required/requested.   Insurance verification completed.   The patient is insured through [facility].   Per test claim: PA required; PA started via CoverMyMeds. KEY [id number] . Waiting for clinical questions to populate.     Will call insurance back due to the insurance is down

## 2024-04-22 ENCOUNTER — Other Ambulatory Visit (HOSPITAL_COMMUNITY): Payer: Self-pay

## 2024-04-22 NOTE — Telephone Encounter (Signed)
 Submitted the prior authorization over the phone.   Case ID: [id number]  Phone #[phone]

## 2024-04-23 ENCOUNTER — Other Ambulatory Visit (HOSPITAL_COMMUNITY): Payer: Self-pay

## 2024-04-23 NOTE — Telephone Encounter (Signed)
 Pharmacy Patient Advocate Encounter  Received notification from [facility] that Prior Authorization for Pitavastatin  has been APPROVED from 03/22/24 to 04/22/25   Approval letter indexed to media tab

## 2024-05-14 ENCOUNTER — Other Ambulatory Visit: Payer: Self-pay | Admitting: Internal Medicine

## 2024-05-16 ENCOUNTER — Encounter: Payer: Self-pay | Admitting: *Deleted

## 2024-06-04 ENCOUNTER — Ambulatory Visit: Admitting: Internal Medicine

## 2024-06-04 ENCOUNTER — Encounter: Payer: Self-pay | Admitting: Internal Medicine

## 2024-06-04 VITALS — BP 136/76 | HR 56 | Temp 98.1°F | Resp 16 | Ht 76.0 in | Wt 270.5 lb

## 2024-06-04 DIAGNOSIS — H9313 Tinnitus, bilateral: Secondary | ICD-10-CM | POA: Diagnosis not present

## 2024-06-04 DIAGNOSIS — E291 Testicular hypofunction: Secondary | ICD-10-CM | POA: Diagnosis not present

## 2024-06-04 LAB — TESTOSTERONE: Testosterone: 164.75 ng/dL — ABNORMAL LOW (ref 300.00–890.00)

## 2024-06-04 NOTE — Progress Notes (Signed)
 "  Subjective:    Patient ID: James Boone, male    DOB: 1960/12/24, 64 y.o.   MRN: [id number]  DOS:  06/04/2024 Follow-up  Discussed the use of AI scribe software for clinical note transcription with the patient, who gave verbal consent to proceed.  History of Present Illness James Boone is a 64 year old male who presents for a follow-up. Tinnitus - Sudden-onset bilateral tinnitus since June 2025 - Persistent and irritating - No recent loud noise exposure - No preceding upper respiratory infection - No improvement with antihistamine or Flonase  prescribed in August 2025  Hearing disturbance - No major hearing loss    Review of Systems See above   Past Medical History:  Diagnosis Date   Abnormal liver function test    Acquired cavovarus deformity of right foot 2016   Dr. Kit, using boot modification and [place]  brace   ADD (attention deficit disorder)    ADD (attention deficit disorder)    Allergic rhinitis    Arthritis    Back pain    lower   Complete rotator cuff tear of left shoulder 07/12/2017   Depression    Hemorrhoid    Hypercholesteremia    Hyperglycemia    Hyperlipidemia    Hypertension    Hypogonadism male    Peripheral edema    Sleep apnea 2005   dx in 2005 , on CPAP setting of 13   Varicose veins    both legs    Past Surgical History:  Procedure Laterality Date   ENDOVENOUS ABLATION SAPHENOUS VEIN W/ LASER Left 10/06/2021   endovenous laser ablation left greater saphenous vein by Penne Colorado MD   HERNIA REPAIR  05/23/1999   abdominal hernia   KNEE ARTHROSCOPY  05/22/2008   left, Dr.Geoffrey   KNEE ARTHROSCOPY  05/22/2006   right, Dr.Geoffrey   SHOULDER ARTHROSCOPY WITH ROTATOR CUFF REPAIR AND SUBACROMIAL DECOMPRESSION Left 07/12/2017   Procedure: LEFT SHOULDER ARTHROSCOPY DEBRIDEMENT, ACROMIOPLASTY, ROTATOR CUFF REPAIR;  Surgeon: Josefina Chew, MD;  Location: [facility];  Service: Orthopedics;  Laterality: Left;   TOTAL ANKLE  REPLACEMENT Right 05/22/2014   TOTAL KNEE ARTHROPLASTY Left 12/30/2012   Procedure: LEFT TOTAL KNEE ARTHROPLASTY;  Surgeon: Dempsey LULLA Moan, MD;  Location: [facility];  Service: Orthopedics;  Laterality: Left;   TOTAL KNEE ARTHROPLASTY Right 03/16/2014   Procedure: RIGHT TOTAL KNEE ARTHROPLASTY;  Surgeon: Dempsey Moan LULLA, MD;  Location: [facility];  Service: Orthopedics;  Laterality: Right;   VASECTOMY  05/22/1988   wisdom teeth extractiion      Current Outpatient Medications  Medication Instructions   ascorbic acid (VITAMIN C) 500 MG tablet Take by mouth.   aspirin EC 81 mg, Daily   buPROPion  (WELLBUTRIN  XL) 300 mg, Oral, Daily   Cyanocobalamin (VITAMIN B 12 PO) Take by mouth.   fluticasone  (FLONASE ) 50 MCG/ACT nasal spray SPRAY 2 SPRAYS INTO EACH NOSTRIL EVERY DAY   lamoTRIgine  (LAMICTAL ) 150 mg, Oral, Daily   levocetirizine (XYZAL ) 5 mg, Oral, Every evening   Multiple Vitamin (MULTIVITAMIN ADULT PO) Take by mouth.   Pitavastatin Calcium  2 mg, Oral, Daily   sertraline  (ZOLOFT ) 100 mg, Oral, Daily   SYRINGE-NEEDLE, DISP, 3 ML (B-D 3CC LUER-LOK SYR 23GX1-1/2) 23G X 1-1/2 3 ML MISC Use as directed for testosterone  injections   testosterone  cypionate (DEPOTESTOSTERONE CYPIONATE) 200 MG/ML injection INJECT 1ML EVERY 2 WEEK AS DIRECTED       Objective:   Physical Exam BP 136/76   Pulse (!) 56  Temp 98.1 F (36.7 C) (Oral)   Resp 16   Ht 6' 4 (1.93 m)   Wt 270 lb 8 oz (122.7 kg)   SpO2 97%   BMI 32.93 kg/m  General:   Well developed, NAD, BMI noted. HEENT:  Normocephalic . Face symmetric, atraumatic. TMs: Poorly visualized but normal.  Had some wax.  No discharge. Neck: No carotid bruit Lungs:  CTA B Normal respiratory effort, no intercostal retractions, no accessory muscle use. Heart: RRR,  no murmur.  Lower extremities: no pretibial edema bilaterally  Skin: Not pale. Not jaundice Neurologic:  alert & oriented X3.  Speech normal, gait appropriate for age and  unassisted Psych--  Cognition and judgment appear intact.  Cooperative with normal attention span and concentration.  Behavior appropriate. No anxious or depressed appearing.      Assessment     Assessment Hyperglycemia  HTN- Hyperlipidemia-- lipitor  Depression, ADD.   Dr. Geoffry,  [facility] Elevated LFTs Chronic venous insuff w/ stasis dermatitis (sees vascular) DJD OSA -- on CPAP Obesity, h/o  Morbid obesity  Hypogonadism -- f/u pcp FH CAD father , 62, smoker Frequent blood donor  PLAN:  Hypogonadism: On testosterone , last CBC and PSA normal.  Recheck on RTC. Tinnitus Persistent bilateral tinnitus since June 2025, sudden onset, no significant hearing loss. Previous treatment with antihistamine and Flonase  was ineffective. - Referred to ENT for further evaluation. Depression, ADD: Per psychiatry. General Health Maintenance Declined flu vaccine and COVID booster benefits discussed. RTC for CPX 3 to 4 months "

## 2024-06-04 NOTE — Patient Instructions (Signed)
 Please read your instructions carefully.   GO TO THE LAB :  Get the blood work    Go to the front desk for the checkout Please make an appointment for a physical exam in 3 to 4 months from now.   We are referring you to a ENT doctor for evaluation of tinnitus

## 2024-06-04 NOTE — Assessment & Plan Note (Signed)
 Hypogonadism: On testosterone , last CBC and PSA normal.  Recheck on RTC. Tinnitus Persistent bilateral tinnitus since June 2025, sudden onset, no significant hearing loss. Previous treatment with antihistamine and Flonase  was ineffective. - Referred to ENT for further evaluation. Depression, ADD: Per psychiatry. General Health Maintenance Declined flu vaccine and COVID booster benefits discussed. RTC for CPX 3 to 4 months

## 2024-06-05 ENCOUNTER — Encounter (INDEPENDENT_AMBULATORY_CARE_PROVIDER_SITE_OTHER): Payer: Self-pay

## 2024-06-05 ENCOUNTER — Telehealth: Payer: Self-pay

## 2024-06-05 NOTE — Telephone Encounter (Signed)
 PA initiated via Covermymeds; KEY: [id number]. Awaiting determination.

## 2024-06-06 ENCOUNTER — Ambulatory Visit: Payer: Self-pay | Admitting: Internal Medicine

## 2024-06-06 DIAGNOSIS — E291 Testicular hypofunction: Secondary | ICD-10-CM

## 2024-06-06 NOTE — Telephone Encounter (Signed)
 He has been on testosterone  for years. Now insurance decided not to cover it. He is taking what I believe is the less expensive form of testosterone . Plan:  Advise patient to call his insurance company. I will be happy to talk with a representative if needed.

## 2024-06-06 NOTE — Telephone Encounter (Signed)
 PA denied.   We have denied your request because your plan does not cover this drug for age-related hypogonadism, also referred to as late-onset hypogonadism. We reviewed the information we had. Your request has been denied. Your doctor can send us  any new or missing information for us  to review. For this drug, you may have to meet other criteria. You can request the drug policy for more details. You can also request other plan documents for your review

## 2024-07-11 ENCOUNTER — Institutional Professional Consult (permissible substitution) (INDEPENDENT_AMBULATORY_CARE_PROVIDER_SITE_OTHER)

## 2024-08-19 ENCOUNTER — Ambulatory Visit: Admitting: Psychiatry

## 2024-10-08 ENCOUNTER — Encounter: Admitting: Internal Medicine
